# Patient Record
Sex: Female | Born: 1955 | Race: White | Hispanic: No | Marital: Married | Smoking: Never smoker
Health system: Southern US, Community
[De-identification: ages and names within clinical notes are randomized; demographics above are authoritative.]

## PROBLEM LIST (undated history)

## (undated) DIAGNOSIS — I10 Essential (primary) hypertension: Secondary | ICD-10-CM

## (undated) DIAGNOSIS — E785 Hyperlipidemia, unspecified: Secondary | ICD-10-CM

## (undated) DIAGNOSIS — G8929 Other chronic pain: Secondary | ICD-10-CM

## (undated) DIAGNOSIS — Z87442 Personal history of urinary calculi: Secondary | ICD-10-CM

## (undated) DIAGNOSIS — J309 Allergic rhinitis, unspecified: Secondary | ICD-10-CM

## (undated) DIAGNOSIS — T7840XA Allergy, unspecified, initial encounter: Secondary | ICD-10-CM

## (undated) DIAGNOSIS — M542 Cervicalgia: Secondary | ICD-10-CM

## (undated) DIAGNOSIS — M353 Polymyalgia rheumatica: Secondary | ICD-10-CM

## (undated) DIAGNOSIS — K219 Gastro-esophageal reflux disease without esophagitis: Secondary | ICD-10-CM

## (undated) DIAGNOSIS — M1990 Unspecified osteoarthritis, unspecified site: Secondary | ICD-10-CM

## (undated) DIAGNOSIS — J320 Chronic maxillary sinusitis: Secondary | ICD-10-CM

## (undated) DIAGNOSIS — M5090 Cervical disc disorder, unspecified, unspecified cervical region: Secondary | ICD-10-CM

## (undated) DIAGNOSIS — E039 Hypothyroidism, unspecified: Secondary | ICD-10-CM

## (undated) DIAGNOSIS — J019 Acute sinusitis, unspecified: Secondary | ICD-10-CM

## (undated) HISTORY — PX: PARTIAL HYSTERECTOMY: SHX80

## (undated) HISTORY — PX: TUBAL LIGATION: SHX77

## (undated) HISTORY — DX: Acute sinusitis, unspecified: J01.90

## (undated) HISTORY — DX: Allergic rhinitis, unspecified: J30.9

## (undated) HISTORY — DX: Unspecified osteoarthritis, unspecified site: M19.90

## (undated) HISTORY — DX: Other chronic pain: G89.29

## (undated) HISTORY — DX: Hyperlipidemia, unspecified: E78.5

## (undated) HISTORY — DX: Chronic maxillary sinusitis: J32.0

## (undated) HISTORY — DX: Cervical disc disorder, unspecified, unspecified cervical region: M50.90

## (undated) HISTORY — DX: Essential (primary) hypertension: I10

## (undated) HISTORY — DX: Personal history of urinary calculi: Z87.442

## (undated) HISTORY — DX: Allergy, unspecified, initial encounter: T78.40XA

## (undated) HISTORY — DX: Hypothyroidism, unspecified: E03.9

## (undated) HISTORY — PX: OTHER SURGICAL HISTORY: SHX169

## (undated) HISTORY — PX: COLONOSCOPY: SHX174

## (undated) HISTORY — PX: TONSILLECTOMY: SUR1361

## (undated) HISTORY — DX: Gastro-esophageal reflux disease without esophagitis: K21.9

## (undated) HISTORY — DX: Cervicalgia: M54.2

---
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
## 1994-04-06 HISTORY — PX: ABDOMINAL HYSTERECTOMY: SHX81

## 2000-11-29 ENCOUNTER — Other Ambulatory Visit: Admission: RE | Admit: 2000-11-29 | Discharge: 2000-11-29 | Payer: Self-pay | Admitting: Family Medicine

## 2001-02-01 ENCOUNTER — Encounter: Payer: Self-pay | Admitting: Family Medicine

## 2001-02-01 ENCOUNTER — Encounter: Admission: RE | Admit: 2001-02-01 | Discharge: 2001-02-01 | Payer: Self-pay | Admitting: Family Medicine

## 2002-01-19 ENCOUNTER — Other Ambulatory Visit: Admission: RE | Admit: 2002-01-19 | Discharge: 2002-01-19 | Payer: Self-pay | Admitting: Obstetrics & Gynecology

## 2002-06-03 ENCOUNTER — Emergency Department (HOSPITAL_COMMUNITY): Admission: EM | Admit: 2002-06-03 | Discharge: 2002-06-03 | Payer: Self-pay

## 2003-04-07 ENCOUNTER — Encounter: Payer: Self-pay | Admitting: Internal Medicine

## 2003-04-07 LAB — CONVERTED CEMR LAB

## 2003-07-04 ENCOUNTER — Other Ambulatory Visit: Admission: RE | Admit: 2003-07-04 | Discharge: 2003-07-04 | Payer: Self-pay | Admitting: Obstetrics & Gynecology

## 2004-03-27 ENCOUNTER — Ambulatory Visit: Payer: Self-pay | Admitting: Internal Medicine

## 2004-10-16 ENCOUNTER — Ambulatory Visit: Payer: Self-pay | Admitting: Internal Medicine

## 2004-12-16 ENCOUNTER — Ambulatory Visit: Payer: Self-pay | Admitting: Internal Medicine

## 2004-12-23 ENCOUNTER — Ambulatory Visit: Payer: Self-pay | Admitting: Internal Medicine

## 2005-01-27 ENCOUNTER — Ambulatory Visit: Payer: Self-pay | Admitting: Internal Medicine

## 2005-03-03 ENCOUNTER — Ambulatory Visit: Payer: Self-pay | Admitting: Internal Medicine

## 2005-03-11 ENCOUNTER — Ambulatory Visit: Payer: Self-pay | Admitting: Gastroenterology

## 2005-03-11 ENCOUNTER — Ambulatory Visit: Payer: Self-pay | Admitting: *Deleted

## 2005-03-16 ENCOUNTER — Ambulatory Visit: Payer: Self-pay | Admitting: Internal Medicine

## 2005-04-07 ENCOUNTER — Other Ambulatory Visit: Admission: RE | Admit: 2005-04-07 | Discharge: 2005-04-07 | Payer: Self-pay | Admitting: Obstetrics & Gynecology

## 2005-04-20 ENCOUNTER — Ambulatory Visit: Payer: Self-pay | Admitting: Gastroenterology

## 2005-05-27 ENCOUNTER — Encounter: Payer: Self-pay | Admitting: Internal Medicine

## 2005-05-27 ENCOUNTER — Ambulatory Visit: Payer: Self-pay | Admitting: Gastroenterology

## 2005-08-11 ENCOUNTER — Ambulatory Visit: Payer: Self-pay | Admitting: Internal Medicine

## 2005-10-08 ENCOUNTER — Ambulatory Visit: Payer: Self-pay | Admitting: Internal Medicine

## 2005-12-04 ENCOUNTER — Ambulatory Visit: Payer: Self-pay | Admitting: Internal Medicine

## 2005-12-14 ENCOUNTER — Ambulatory Visit: Payer: Self-pay | Admitting: Internal Medicine

## 2005-12-22 ENCOUNTER — Ambulatory Visit: Payer: Self-pay | Admitting: Internal Medicine

## 2005-12-29 ENCOUNTER — Ambulatory Visit: Payer: Self-pay | Admitting: Internal Medicine

## 2006-02-16 ENCOUNTER — Ambulatory Visit: Payer: Self-pay | Admitting: Internal Medicine

## 2006-06-04 ENCOUNTER — Ambulatory Visit: Payer: Self-pay | Admitting: Internal Medicine

## 2006-09-21 ENCOUNTER — Ambulatory Visit: Payer: Self-pay | Admitting: Internal Medicine

## 2006-11-02 ENCOUNTER — Ambulatory Visit: Payer: Self-pay | Admitting: Internal Medicine

## 2006-11-02 DIAGNOSIS — K219 Gastro-esophageal reflux disease without esophagitis: Secondary | ICD-10-CM

## 2006-11-02 DIAGNOSIS — J309 Allergic rhinitis, unspecified: Secondary | ICD-10-CM

## 2006-11-02 DIAGNOSIS — E039 Hypothyroidism, unspecified: Secondary | ICD-10-CM | POA: Insufficient documentation

## 2006-11-02 HISTORY — DX: Hypothyroidism, unspecified: E03.9

## 2006-11-02 HISTORY — DX: Allergic rhinitis, unspecified: J30.9

## 2006-11-02 HISTORY — DX: Gastro-esophageal reflux disease without esophagitis: K21.9

## 2006-11-13 ENCOUNTER — Encounter: Payer: Self-pay | Admitting: Internal Medicine

## 2006-12-30 ENCOUNTER — Ambulatory Visit: Payer: Self-pay | Admitting: Internal Medicine

## 2006-12-30 LAB — CONVERTED CEMR LAB
ALT: 18 units/L (ref 0–35)
AST: 19 units/L (ref 0–37)
Albumin: 3.8 g/dL (ref 3.5–5.2)
Alkaline Phosphatase: 67 units/L (ref 39–117)
BUN: 9 mg/dL (ref 6–23)
Basophils Absolute: 0.1 10*3/uL (ref 0.0–0.1)
Basophils Relative: 1 % (ref 0.0–1.0)
Bilirubin Urine: NEGATIVE
Bilirubin, Direct: 0.1 mg/dL (ref 0.0–0.3)
CO2: 29 meq/L (ref 19–32)
Calcium: 9.1 mg/dL (ref 8.4–10.5)
Chloride: 106 meq/L (ref 96–112)
Cholesterol: 185 mg/dL (ref 0–200)
Creatinine, Ser: 0.7 mg/dL (ref 0.4–1.2)
Eosinophils Absolute: 0.2 10*3/uL (ref 0.0–0.6)
Eosinophils Relative: 3.1 % (ref 0.0–5.0)
GFR calc Af Amer: 113 mL/min
GFR calc non Af Amer: 94 mL/min
Glucose, Bld: 86 mg/dL (ref 70–99)
HCT: 38.1 % (ref 36.0–46.0)
HDL: 57.3 mg/dL (ref >39.0)
Hemoglobin, Urine: NEGATIVE
Hemoglobin: 13.2 g/dL (ref 12.0–15.0)
Ketones, ur: NEGATIVE mg/dL
LDL Cholesterol: 106 mg/dL — ABNORMAL HIGH (ref 0–99)
Leukocytes, UA: NEGATIVE
Lymphocytes Relative: 35.5 % (ref 12.0–46.0)
MCHC: 34.6 g/dL (ref 30.0–36.0)
MCV: 88.5 fL (ref 78.0–100.0)
Monocytes Absolute: 0.4 10*3/uL (ref 0.2–0.7)
Monocytes Relative: 7.1 % (ref 3.0–11.0)
Neutro Abs: 2.8 10*3/uL (ref 1.4–7.7)
Neutrophils Relative %: 53.3 % (ref 43.0–77.0)
Nitrite: NEGATIVE
Platelets: 186 10*3/uL (ref 150–400)
Potassium: 4.3 meq/L (ref 3.5–5.1)
RBC: 4.31 M/uL (ref 3.87–5.11)
RDW: 12.7 % (ref 11.5–14.6)
Sodium: 140 meq/L (ref 135–145)
Specific Gravity, Urine: 1.015 (ref 1.000–1.03)
TSH: 13.56 microintl units/mL — ABNORMAL HIGH (ref 0.35–5.50)
Total Bilirubin: 0.9 mg/dL (ref 0.3–1.2)
Total CHOL/HDL Ratio: 3.2
Total Protein, Urine: NEGATIVE mg/dL
Total Protein: 6.2 g/dL (ref 6.0–8.3)
Triglycerides: 111 mg/dL (ref 0–149)
Urine Glucose: NEGATIVE mg/dL
Urobilinogen, UA: 0.2 (ref 0.0–1.0)
VLDL: 22 mg/dL (ref 0–40)
WBC: 5.4 10*3/uL (ref 4.5–10.5)
pH: 7 (ref 5.0–8.0)

## 2007-01-05 ENCOUNTER — Ambulatory Visit: Payer: Self-pay | Admitting: Internal Medicine

## 2007-01-10 ENCOUNTER — Encounter: Payer: Self-pay | Admitting: Internal Medicine

## 2007-01-10 ENCOUNTER — Ambulatory Visit: Payer: Self-pay | Admitting: Internal Medicine

## 2007-01-10 DIAGNOSIS — E785 Hyperlipidemia, unspecified: Secondary | ICD-10-CM

## 2007-01-10 DIAGNOSIS — M5412 Radiculopathy, cervical region: Secondary | ICD-10-CM | POA: Insufficient documentation

## 2007-01-10 DIAGNOSIS — Z87442 Personal history of urinary calculi: Secondary | ICD-10-CM

## 2007-01-10 DIAGNOSIS — I10 Essential (primary) hypertension: Secondary | ICD-10-CM | POA: Insufficient documentation

## 2007-01-10 HISTORY — DX: Personal history of urinary calculi: Z87.442

## 2007-01-10 HISTORY — DX: Hyperlipidemia, unspecified: E78.5

## 2007-01-10 HISTORY — DX: Essential (primary) hypertension: I10

## 2007-02-01 ENCOUNTER — Telehealth (INDEPENDENT_AMBULATORY_CARE_PROVIDER_SITE_OTHER): Payer: Self-pay | Admitting: *Deleted

## 2007-02-09 ENCOUNTER — Ambulatory Visit: Payer: Self-pay | Admitting: Internal Medicine

## 2007-02-09 DIAGNOSIS — R519 Headache, unspecified: Secondary | ICD-10-CM | POA: Insufficient documentation

## 2007-02-09 DIAGNOSIS — R51 Headache: Secondary | ICD-10-CM | POA: Insufficient documentation

## 2007-04-18 ENCOUNTER — Ambulatory Visit: Payer: Self-pay | Admitting: Internal Medicine

## 2007-06-09 ENCOUNTER — Ambulatory Visit: Payer: Self-pay | Admitting: Internal Medicine

## 2007-06-22 ENCOUNTER — Ambulatory Visit: Payer: Self-pay | Admitting: Internal Medicine

## 2007-08-12 ENCOUNTER — Ambulatory Visit: Payer: Self-pay | Admitting: Internal Medicine

## 2007-08-15 ENCOUNTER — Encounter: Payer: Self-pay | Admitting: Internal Medicine

## 2007-09-13 ENCOUNTER — Telehealth (INDEPENDENT_AMBULATORY_CARE_PROVIDER_SITE_OTHER): Payer: Self-pay | Admitting: *Deleted

## 2007-09-13 ENCOUNTER — Encounter: Payer: Self-pay | Admitting: Internal Medicine

## 2007-10-04 ENCOUNTER — Telehealth (INDEPENDENT_AMBULATORY_CARE_PROVIDER_SITE_OTHER): Payer: Self-pay | Admitting: *Deleted

## 2008-01-05 ENCOUNTER — Ambulatory Visit: Payer: Self-pay | Admitting: Internal Medicine

## 2008-01-17 ENCOUNTER — Telehealth (INDEPENDENT_AMBULATORY_CARE_PROVIDER_SITE_OTHER): Payer: Self-pay | Admitting: *Deleted

## 2008-01-31 ENCOUNTER — Ambulatory Visit: Payer: Self-pay | Admitting: Internal Medicine

## 2008-01-31 LAB — CONVERTED CEMR LAB
ALT: 17 units/L (ref 0–35)
AST: 19 units/L (ref 0–37)
Albumin: 3.7 g/dL (ref 3.5–5.2)
Alkaline Phosphatase: 81 units/L (ref 39–117)
BUN: 9 mg/dL (ref 6–23)
Basophils Absolute: 0 10*3/uL (ref 0.0–0.1)
Basophils Relative: 0.7 % (ref 0.0–3.0)
Bilirubin Urine: NEGATIVE
Bilirubin, Direct: 0.1 mg/dL (ref 0.0–0.3)
CO2: 28 meq/L (ref 19–32)
Calcium: 9.1 mg/dL (ref 8.4–10.5)
Chloride: 108 meq/L (ref 96–112)
Cholesterol: 179 mg/dL (ref 0–200)
Creatinine, Ser: 0.7 mg/dL (ref 0.4–1.2)
Eosinophils Absolute: 0.2 10*3/uL (ref 0.0–0.7)
Eosinophils Relative: 3.5 % (ref 0.0–5.0)
GFR calc Af Amer: 113 mL/min
GFR calc non Af Amer: 93 mL/min
Glucose, Bld: 95 mg/dL (ref 70–99)
HCT: 36.4 % (ref 36.0–46.0)
HDL: 46.5 mg/dL (ref >39.0)
Hemoglobin, Urine: NEGATIVE
Hemoglobin: 12.8 g/dL (ref 12.0–15.0)
Ketones, ur: NEGATIVE mg/dL
LDL Cholesterol: 107 mg/dL — ABNORMAL HIGH (ref 0–99)
Leukocytes, UA: NEGATIVE
Lymphocytes Relative: 39.3 % (ref 12.0–46.0)
MCHC: 35.1 g/dL (ref 30.0–36.0)
MCV: 89.4 fL (ref 78.0–100.0)
Monocytes Absolute: 0.4 10*3/uL (ref 0.1–1.0)
Monocytes Relative: 7.3 % (ref 3.0–12.0)
Neutro Abs: 3 10*3/uL (ref 1.4–7.7)
Neutrophils Relative %: 49.2 % (ref 43.0–77.0)
Nitrite: NEGATIVE
Platelets: 155 10*3/uL (ref 150–400)
Potassium: 4.2 meq/L (ref 3.5–5.1)
RBC: 4.07 M/uL (ref 3.87–5.11)
RDW: 12.5 % (ref 11.5–14.6)
Sodium: 141 meq/L (ref 135–145)
Specific Gravity, Urine: 1.025 (ref 1.000–1.03)
TSH: 2.85 microintl units/mL (ref 0.35–5.50)
Total Bilirubin: 0.4 mg/dL (ref 0.3–1.2)
Total CHOL/HDL Ratio: 3.8
Total Protein, Urine: NEGATIVE mg/dL
Total Protein: 6.2 g/dL (ref 6.0–8.3)
Triglycerides: 128 mg/dL (ref 0–149)
Urine Glucose: NEGATIVE mg/dL
Urobilinogen, UA: 0.2 (ref 0.0–1.0)
VLDL: 26 mg/dL (ref 0–40)
WBC: 5.9 10*3/uL (ref 4.5–10.5)
pH: 5.5 (ref 5.0–8.0)

## 2008-02-06 ENCOUNTER — Ambulatory Visit: Payer: Self-pay | Admitting: Internal Medicine

## 2008-03-06 ENCOUNTER — Ambulatory Visit: Payer: Self-pay | Admitting: Internal Medicine

## 2008-03-06 DIAGNOSIS — M6740 Ganglion, unspecified site: Secondary | ICD-10-CM | POA: Insufficient documentation

## 2008-03-22 ENCOUNTER — Telehealth (INDEPENDENT_AMBULATORY_CARE_PROVIDER_SITE_OTHER): Payer: Self-pay | Admitting: *Deleted

## 2008-05-18 ENCOUNTER — Ambulatory Visit: Payer: Self-pay | Admitting: Internal Medicine

## 2008-05-18 DIAGNOSIS — B370 Candidal stomatitis: Secondary | ICD-10-CM | POA: Insufficient documentation

## 2008-05-18 DIAGNOSIS — M722 Plantar fascial fibromatosis: Secondary | ICD-10-CM | POA: Insufficient documentation

## 2008-08-22 ENCOUNTER — Telehealth: Payer: Self-pay | Admitting: Internal Medicine

## 2008-09-04 LAB — CONVERTED CEMR LAB: Pap Smear: NORMAL

## 2008-09-05 ENCOUNTER — Ambulatory Visit: Payer: Self-pay | Admitting: Internal Medicine

## 2008-10-05 ENCOUNTER — Ambulatory Visit: Payer: Self-pay | Admitting: Internal Medicine

## 2008-10-05 DIAGNOSIS — J320 Chronic maxillary sinusitis: Secondary | ICD-10-CM | POA: Insufficient documentation

## 2008-10-05 HISTORY — DX: Chronic maxillary sinusitis: J32.0

## 2009-02-01 ENCOUNTER — Ambulatory Visit: Payer: Self-pay | Admitting: Internal Medicine

## 2009-02-04 LAB — CONVERTED CEMR LAB
ALT: 19 units/L (ref 0–35)
AST: 21 units/L (ref 0–37)
Albumin: 4.4 g/dL (ref 3.5–5.2)
Alkaline Phosphatase: 73 units/L (ref 39–117)
BUN: 11 mg/dL (ref 6–23)
Basophils Absolute: 0 10*3/uL (ref 0.0–0.1)
Basophils Relative: 0.7 % (ref 0.0–3.0)
Bilirubin Urine: NEGATIVE
Bilirubin, Direct: 0 mg/dL (ref 0.0–0.3)
CO2: 30 meq/L (ref 19–32)
Calcium: 9.4 mg/dL (ref 8.4–10.5)
Chloride: 99 meq/L (ref 96–112)
Cholesterol: 234 mg/dL — ABNORMAL HIGH (ref 0–200)
Creatinine, Ser: 0.7 mg/dL (ref 0.4–1.2)
Direct LDL: 151.7 mg/dL
Eosinophils Absolute: 0.2 10*3/uL (ref 0.0–0.7)
Eosinophils Relative: 2.5 % (ref 0.0–5.0)
GFR calc non Af Amer: 92.85 mL/min (ref >60)
Glucose, Bld: 90 mg/dL (ref 70–99)
HCT: 37.8 % (ref 36.0–46.0)
HDL: 53.8 mg/dL (ref >39.00)
Hemoglobin, Urine: NEGATIVE
Hemoglobin: 13.5 g/dL (ref 12.0–15.0)
Ketones, ur: NEGATIVE mg/dL
Leukocytes, UA: NEGATIVE
Lymphocytes Relative: 32.1 % (ref 12.0–46.0)
Lymphs Abs: 2.2 10*3/uL (ref 0.7–4.0)
MCHC: 35.7 g/dL (ref 30.0–36.0)
MCV: 91.8 fL (ref 78.0–100.0)
Monocytes Absolute: 0.4 10*3/uL (ref 0.1–1.0)
Monocytes Relative: 5.6 % (ref 3.0–12.0)
Neutro Abs: 3.9 10*3/uL (ref 1.4–7.7)
Neutrophils Relative %: 59.1 % (ref 43.0–77.0)
Nitrite: NEGATIVE
Platelets: 186 10*3/uL (ref 150.0–400.0)
Potassium: 4 meq/L (ref 3.5–5.1)
RBC: 4.12 M/uL (ref 3.87–5.11)
RDW: 13.1 % (ref 11.5–14.6)
Sodium: 138 meq/L (ref 135–145)
Specific Gravity, Urine: 1.015 (ref 1.000–1.030)
TSH: 97.75 microintl units/mL — ABNORMAL HIGH (ref 0.35–5.50)
Total Bilirubin: 0.5 mg/dL (ref 0.3–1.2)
Total CHOL/HDL Ratio: 4
Total Protein, Urine: NEGATIVE mg/dL
Total Protein: 7.4 g/dL (ref 6.0–8.3)
Triglycerides: 211 mg/dL — ABNORMAL HIGH (ref 0.0–149.0)
Urine Glucose: NEGATIVE mg/dL
Urobilinogen, UA: 0.2 (ref 0.0–1.0)
VLDL: 42.2 mg/dL — ABNORMAL HIGH (ref 0.0–40.0)
WBC: 6.7 10*3/uL (ref 4.5–10.5)
pH: 6.5 (ref 5.0–8.0)

## 2009-02-06 ENCOUNTER — Ambulatory Visit: Payer: Self-pay | Admitting: Internal Medicine

## 2009-02-06 DIAGNOSIS — G4710 Hypersomnia, unspecified: Secondary | ICD-10-CM | POA: Insufficient documentation

## 2009-02-21 ENCOUNTER — Telehealth: Payer: Self-pay | Admitting: Internal Medicine

## 2009-03-07 ENCOUNTER — Ambulatory Visit: Payer: Self-pay | Admitting: Internal Medicine

## 2009-03-07 LAB — CONVERTED CEMR LAB: TSH: 4.31 microintl units/mL (ref 0.35–5.50)

## 2009-03-14 ENCOUNTER — Ambulatory Visit: Payer: Self-pay | Admitting: Internal Medicine

## 2009-08-23 ENCOUNTER — Ambulatory Visit: Payer: Self-pay | Admitting: Internal Medicine

## 2009-09-04 LAB — CONVERTED CEMR LAB: Pap Smear: NORMAL

## 2010-01-31 ENCOUNTER — Telehealth: Payer: Self-pay | Admitting: Internal Medicine

## 2010-03-27 ENCOUNTER — Ambulatory Visit: Payer: Self-pay | Admitting: Internal Medicine

## 2010-03-27 LAB — CONVERTED CEMR LAB
ALT: 17 units/L (ref 0–35)
AST: 16 units/L (ref 0–37)
Albumin: 4.1 g/dL (ref 3.5–5.2)
Alkaline Phosphatase: 70 units/L (ref 39–117)
BUN: 12 mg/dL (ref 6–23)
Basophils Absolute: 0 10*3/uL (ref 0.0–0.1)
Basophils Relative: 0.8 % (ref 0.0–3.0)
Bilirubin Urine: NEGATIVE
Bilirubin, Direct: 0.1 mg/dL (ref 0.0–0.3)
Blood, UA: NEGATIVE
CO2: 26 meq/L (ref 19–32)
Calcium: 8.8 mg/dL (ref 8.4–10.5)
Chloride: 105 meq/L (ref 96–112)
Cholesterol: 174 mg/dL (ref 0–200)
Creatinine, Ser: 0.7 mg/dL (ref 0.4–1.2)
Eosinophils Absolute: 0.1 10*3/uL (ref 0.0–0.7)
Eosinophils Relative: 1.5 % (ref 0.0–5.0)
Folate: 7.6 ng/mL
GFR calc non Af Amer: 94 mL/min (ref >60.00)
Glucose, Bld: 79 mg/dL (ref 70–99)
HCT: 34.9 % — ABNORMAL LOW (ref 36.0–46.0)
HDL: 44.8 mg/dL (ref >39.00)
Hemoglobin: 11.9 g/dL — ABNORMAL LOW (ref 12.0–15.0)
Iron: 60 ug/dL (ref 42–145)
Ketones, ur: NEGATIVE mg/dL
LDL Cholesterol: 97 mg/dL (ref 0–99)
Leukocytes, UA: NEGATIVE
Lymphocytes Relative: 35.9 % (ref 12.0–46.0)
Lymphs Abs: 2 10*3/uL (ref 0.7–4.0)
MCHC: 34.1 g/dL (ref 30.0–36.0)
MCV: 90.3 fL (ref 78.0–100.0)
Monocytes Absolute: 0.3 10*3/uL (ref 0.1–1.0)
Monocytes Relative: 6.2 % (ref 3.0–12.0)
Neutro Abs: 3.1 10*3/uL (ref 1.4–7.7)
Neutrophils Relative %: 55.6 % (ref 43.0–77.0)
Nitrite: NEGATIVE
Platelets: 208 10*3/uL (ref 150.0–400.0)
Potassium: 4 meq/L (ref 3.5–5.1)
RBC: 3.87 M/uL (ref 3.87–5.11)
RDW: 13.9 % (ref 11.5–14.6)
Saturation Ratios: 14.4 % — ABNORMAL LOW (ref 20.0–50.0)
Sodium: 138 meq/L (ref 135–145)
Specific Gravity, Urine: 1.02 (ref 1.000–1.030)
TSH: 2.98 microintl units/mL (ref 0.35–5.50)
Total Bilirubin: 0.5 mg/dL (ref 0.3–1.2)
Total CHOL/HDL Ratio: 4
Total Protein, Urine: NEGATIVE mg/dL
Total Protein: 6.5 g/dL (ref 6.0–8.3)
Transferrin: 298.4 mg/dL (ref 212.0–360.0)
Triglycerides: 162 mg/dL — ABNORMAL HIGH (ref 0.0–149.0)
Urine Glucose: NEGATIVE mg/dL
Urobilinogen, UA: 0.2 (ref 0.0–1.0)
VLDL: 32.4 mg/dL (ref 0.0–40.0)
Vitamin B-12: 310 pg/mL (ref 211–911)
WBC: 5.6 10*3/uL (ref 4.5–10.5)
pH: 6 (ref 5.0–8.0)

## 2010-04-03 ENCOUNTER — Encounter: Payer: Self-pay | Admitting: Internal Medicine

## 2010-04-03 ENCOUNTER — Ambulatory Visit: Payer: Self-pay | Admitting: Internal Medicine

## 2010-04-03 DIAGNOSIS — J019 Acute sinusitis, unspecified: Secondary | ICD-10-CM | POA: Insufficient documentation

## 2010-04-03 HISTORY — DX: Acute sinusitis, unspecified: J01.90

## 2010-05-06 NOTE — Assessment & Plan Note (Signed)
Summary: DR Sammuel Cooper PT/NO CLINIC-SINUS PROBLEM  STC   Vital Signs:  Patient profile:   55 year old female Height:      67 inches (170.18 cm) Weight:      214.0 pounds (97.27 kg) O2 Sat:      97 % on Room air Temp:     100.1 degrees F (37.83 degrees C) oral Pulse rate:   100 / minute BP sitting:   114 / 82  (left arm) Cuff size:   large  Vitals Entered By: Orlan Leavens (Aug 23, 2009 1:18 PM)  O2 Flow:  Room air CC: sinus problem, URI symptoms Is Patient Diabetic? No Pain Assessment Patient in pain? no        Primary Care Provider:  Corwin Levins MD  CC:  sinus problem and URI symptoms.  History of Present Illness:  URI Symptoms      This is a 55 year old woman who presents with URI symptoms.  The symptoms began 3 weeks ago.  The severity is described as moderate.  long hx recurrent symptoms -.  The patient reports nasal congestion, purulent nasal discharge, sore throat, and earache, but denies clear nasal discharge and sick contacts.  Associated symptoms include fever of 100.5-103 degrees.  The patient denies stiff neck, dyspnea, wheezing, rash, and diarrhea.  The patient also reports itchy throat, seasonal symptoms, headache, and severe fatigue.  The patient denies sneezing, response to antihistamine, and muscle aches.  The patient denies the following risk factors for Strep sinusitis: unilateral facial pain, unilateral nasal discharge, poor response to decongestant, tooth pain, Strep exposure, and tender adenopathy.     Current Medications (verified): 1)  Levothyroxine Sodium 112 Mcg  Tabs (Levothyroxine Sodium) .Marland Kitchen.. 1 By Mouth Once Daily 2)  Lisinopril 20 Mg Tabs (Lisinopril) .Marland Kitchen.. 1 By Mouth Once Daily 3)  Vivelle-Dot 0.0375 Mg/24hr  Pttw (Estradiol) .... Once Daily To Skin 4)  Hydrocodone-Acetaminophen 5-500 Mg Tabs (Hydrocodone-Acetaminophen) .... Take 1 Tablet By Mouth Two Times A Day As Needed 5)  Allegra-D 12 Hour 60-120 Mg  Tb12 (Fexofenadine-Pseudoephedrine) .Marland Kitchen.. 1 By Mouth  Two Times A Day Prn 6)  Fluticasone Propionate 50 Mcg/act  Susp (Fluticasone Propionate) .... 2 Spray/side Once Daily 7)  Hyoscyamine Sulfate 0.125 Mg Tbdp (Hyoscyamine Sulfate) .... Dissolve 2 Tablets Under The Tounge Every 4 Hours If Needed  Allergies (verified): No Known Drug Allergies  Past History:  Past Medical History: Hypothyroidism Allergic rhinitis GERD, mild Hypertension Hyperlipidemia chronic neck pain/c-spine disc dz Nephrolithiasis, hx of  Review of Systems  The patient denies vision loss, hoarseness, dyspnea on exertion, hemoptysis, and abdominal pain.    Physical Exam  General:  alert and overweight-appearing.  , mild ill  Eyes:  vision grossly intact, pupils equal, and pupils round.   Ears:  left tm with mild erythema, right tm ok, canals clear, sinus tender mild bilat max areas Mouth:  pharyngeal erythema and fair dentition.   Lungs:  normal respiratory effort, no intercostal retractions or use of accessory muscles; normal breath sounds bilaterally - no crackles and no wheezes.    Heart:  normal rate, regular rhythm, no murmur, and no rub. 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 1po once daily Her updated medication list for this problem includes:    Allegra-d 12 Hour 60-120 Mg Tb12 (Fexofenadine-pseudoephedrine) .Marland Kitchen... 1 by mouth two times a day prn    Fluticasone Propionate 50 Mcg/act Susp (Fluticasone propionate) .Marland Kitchen... 2 spray/side once daily    Levaquin 500 Mg Tabs (Levofloxacin) .Marland Kitchen... 1 by mouth once daily x 2 weeks  Orders: Prescription Created Electronically [phone])  Complete Medication List: 1)  Levothyroxine Sodium 112 Mcg Tabs  (Levothyroxine sodium) .Marland Kitchen.. 1 by mouth once daily 2)  Lisinopril 20 Mg Tabs (Lisinopril) .Marland Kitchen.. 1 by mouth once daily 3)  Vivelle-dot 0.0375 Mg/24hr Pttw (Estradiol) .... Once daily to skin 4)  Hydrocodone-acetaminophen 5-500 Mg Tabs (Hydrocodone-acetaminophen) .... Take 1 tablet by mouth two times a day as needed 5)  Allegra-d 12 Hour 60-120 Mg Tb12 (Fexofenadine-pseudoephedrine) .Marland Kitchen.. 1 by mouth two times a day prn 6)  Fluticasone Propionate 50 Mcg/act Susp (Fluticasone propionate) .... 2 spray/side once daily 7)  Hyoscyamine Sulfate 0.125 Mg Tbdp (Hyoscyamine sulfate) .... Dissolve 2 tablets under the tounge every 4 hours if needed 8)  Levaquin 500 Mg Tabs (Levofloxacin) .Marland Kitchen.. 1 by mouth once daily x 2 weeks  Patient Instructions: 1)  it was good to see you today. 2)  antibitoics - Levaquin and continue nasal steroid spray - 3)  your prescriptions have been electronically submitted to your pharmacy. Please take as directed. Contact our office if you believe you're having problems with the medication(s).  4)  Get plenty of rest, drink lots of clear liquids, and use Tylenol or Ibuprofen for fever and comfort. Return in 7-10 days if you're not better:sooner if you're feeling worse. Prescriptions: FLUTICASONE PROPIONATE 50 MCG/ACT  SUSP (FLUTICASONE PROPIONATE) 2 spray/side once daily  #3 x 3   Entered and Authorized by:   Newt Lukes MD   Signed by:   Newt Lukes MD on 08/23/2009   Method used:   Electronically to        [facility]. [phone]* (retail)       [address]       [place], [place]  [address]       Ph: [phone]       Fax: [phone]   RxID:   [id number] LEVAQUIN 500 MG TABS (LEVOFLOXACIN) 1 by mouth once daily x 2 weeks  #14 x 0   Entered and Authorized by:   Newt Lukes MD   Signed by:   Newt Lukes MD on 08/23/2009   Method used:   Electronically to        [facility]. [phone]* (retail)       [address]       [place], [place]  [address]       Ph: [phone]       Fax: [phone]   RxID:   [id number]

## 2010-05-06 NOTE — Progress Notes (Signed)
Summary: lab review  Phone Note Call from Patient Call back at Home Phone [phone]   Caller: Patient Call For: dr Jonny Ruiz Summary of Call: per pt call  would like dr Jonny Ruiz to review  her labs that were done at  Dr neal office . want to know if there is anything he would like her to do, and  to see if he wanted to changed her thyroid medicine before she go  for a refill. have about 6 days left on her refill .......lab on cart Initial call taken by: Shelbie Proctor,  October 04, 2007 8:29 AM  Follow-up for Phone Call        already addressed - see lab sheet I reviewed yesterday regrarding the thyroid med Follow-up by: Corwin Levins MD,  October 04, 2007 8:57 AM         Appended Document: lab review LMOVM stating that MD reviewed lad work and advised that pt take Levothyroxine . Rx has been sent to pharmacy

## 2010-05-06 NOTE — Progress Notes (Signed)
Summary: not better  Phone Note Call from Patient Call back at Home Phone [phone]   Caller: Patient Call For: Corwin Levins MD Summary of Call: per Garnetta Buddy call saw  Dr  Jonny Ruiz on 12-01 still have a persistent- pain in her throat and ear pain want to know if he can send  in another medication for this has finished up the antibotics that was prescribe [facility]  [facility]. [phone]* (retail) [address] [place], [place]  [address] Ph: [phone] Fax: [phone] Initial call taken by: Shelbie Proctor,  March 22, 2008 2:50 PM  Follow-up for Phone Call        can try zpack x 1 - done escript Follow-up by: Corwin Levins MD,  March 22, 2008 3:26 PM  Additional Follow-up for Phone Call Additional follow up Details #1::        called pt to inform that rx was sent Additional Follow-up by: Shelbie Proctor,  March 22, 2008 3:50 PM    New/Updated Medications: AZITHROMYCIN 250 MG TABS (AZITHROMYCIN) 2po qd for 1 day, then 1po qd for 4days, then stop   Prescriptions: AZITHROMYCIN 250 MG TABS (AZITHROMYCIN) 2po qd for 1 day, then 1po qd for 4days, then stop  #6 x 1   Entered and Authorized by:   Corwin Levins MD   Signed by:   Corwin Levins MD on 03/22/2008   Method used:   Electronically to        [facility]. [phone]* (retail)       [address]       [place], [place]  [address]       Ph: [phone]       Fax: [phone]   RxID:   [id number]

## 2010-05-06 NOTE — Assessment & Plan Note (Signed)
Summary: PER PT PHYSICAL---$50--STC   Vital Signs:  Patient Profile:   55 Years Old Female Weight:      206.13 pounds Temp:     98.1 degrees F oral Pulse rate:   92 / minute BP sitting:   138 / 80  (left arm) Cuff size:   regular  Vitals Entered By: Windell Norfolk (February 06, 2008 1:14 PM)                 Preventive Care Screening  Last Tetanus Booster:    Date:  02/06/2008    Results:  declined  Last Flu Shot:    Date:  01/23/2008    Results:  given    Chief Complaint:  CPX.  History of Present Illness: still has some right ear congesiton and sinus crackling on the left; has also tried the nettie pot but not much help; has seens allergy and ENT; just seems to "pull her down,"      Updated Prior Medication List: LEVOTHYROXINE SODIUM 112 MCG  TABS (LEVOTHYROXINE SODIUM) 1 by mouth once daily LISINOPRIL 20 MG TABS (LISINOPRIL) 1 by mouth once daily VIVELLE-DOT 0.0375 MG/24HR  PTTW (ESTRADIOL) once daily to skin HYDROCODONE-ACETAMINOPHEN 5-500 MG TABS (HYDROCODONE-ACETAMINOPHEN) Take 1 tablet by mouth two times a day as needed ALLEGRA-D 12 HOUR 60-120 MG  TB12 (FEXOFENADINE-PSEUDOEPHEDRINE) 1 by mouth two times a day prn FLUTICASONE PROPIONATE 50 MCG/ACT  SUSP (FLUTICASONE PROPIONATE) 2 spray/side once daily  Current Allergies (reviewed today): No known allergies   Past Medical History:    Reviewed history from 01/10/2007 and no changes required:       Hypothyroidism       Allergic rhinitis       GERD, mild       Hypertension       Hyperlipidemia       chronic neck pain/c-spine disc dz       Nephrolithiasis, hx of  Past Surgical History:    Reviewed history from 11/02/2006 and no changes required:       Hysterectomy- 1996       Tonsillectomy- 1961   Family History:    Reviewed history from 01/10/2007 and no changes required:       Family History Diabetes 1st degree relative       Family History Hypertension  Social History:    Reviewed history from  01/10/2007 and no changes required:       Married       Never Smoked       Alcohol use-no       2 jobs - Systems analyst area; and [employer]       2 children     Review of Systems  The patient denies anorexia, fever, weight loss, weight gain, vision loss, decreased hearing, hoarseness, chest pain, syncope, dyspnea on exertion, peripheral edema, prolonged cough, headaches, hemoptysis, abdominal pain, melena, hematochezia, severe indigestion/heartburn, hematuria, incontinence, muscle weakness, suspicious skin lesions, transient blindness, difficulty walking, depression, unusual weight change, abnormal bleeding, enlarged lymph nodes, angioedema, and breast masses.         all otherwise negative    Physical Exam  General:     alert and overweight-appearing.   Head:     Normocephalic and atraumatic without obvious abnormalities. No apparent alopecia or balding. Eyes:     No corneal or conjunctival inflammation noted. EOMI. Perrla. Ears:     External ear exam shows no significant lesions or deformities.  Otoscopic examination reveals clear canals,  tympanic membranes are intact bilaterally without bulging, retraction, inflammation or discharge. Hearing is grossly normal bilaterally. Nose:     External nasal examination shows no deformity or inflammation. Nasal mucosa are pink and moist without lesions or exudates. Mouth:     Oral mucosa and oropharynx without lesions or exudates.  Teeth in good repair. Neck:     No deformities, masses, or tenderness noted. Lungs:     Normal respiratory effort, chest expands symmetrically. Lungs are clear to auscultation, no crackles or wheezes. Heart:     Normal rate and regular rhythm. S1 and S2 normal without gallop, murmur, click, rub or other extra sounds. Abdomen:     Bowel sounds positive,abdomen soft and non-tender without masses, organomegaly or hernias noted. Msk:     no joint tenderness and no joint swelling.    Extremities:     no edema, no ulcers  Neurologic:     cranial nerves II-XII intact and strength normal in all extremities.      Impression & Recommendations:  Problem # 1:  Preventive Health Care (ICD-V70.0) Overall doing well, up to date, counseled on routine health concerns for screening and prevention, immunizations up to date or declined, labs , reviewed, ecg reviewed , to follow low chol diet   Orders: EKG w/ Interpretation (93000)   Problem # 2:  HYPERTENSION, BENIGN ESSENTIAL (ICD-401.1)  Her updated medication list for this problem includes:    Lisinopril 20 Mg Tabs (Lisinopril) .Marland Kitchen... 1 by mouth once daily  BP today: 138/80 Prior BP: 116/80 (01/05/2008)  Labs Reviewed: Creat: 0.7 (01/31/2008) Chol: 179 (01/31/2008)   HDL: 46.5 (01/31/2008)   LDL: 107 (01/31/2008)   TG: 128 (01/31/2008) stable overall by hx and exam, ok to continue meds/tx as is   Complete Medication List: 1)  Levothyroxine Sodium 112 Mcg Tabs (Levothyroxine sodium) .Marland Kitchen.. 1 by mouth once daily 2)  Lisinopril 20 Mg Tabs (Lisinopril) .Marland Kitchen.. 1 by mouth once daily 3)  Vivelle-dot 0.0375 Mg/24hr Pttw (Estradiol) .... Once daily to skin 4)  Hydrocodone-acetaminophen 5-500 Mg Tabs (Hydrocodone-acetaminophen) .... Take 1 tablet by mouth two times a day as needed 5)  Allegra-d 12 Hour 60-120 Mg Tb12 (Fexofenadine-pseudoephedrine) .Marland Kitchen.. 1 by mouth two times a day prn 6)  Fluticasone Propionate 50 Mcg/act Susp (Fluticasone propionate) .... 2 spray/side once daily   Patient Instructions: 1)  Continue all medications that you may have been taking previously 2)  Please schedule a follow-up appointment in 1 year or sooner if needed 3)  please call in december to see if we are giving the swine flu shot if you like 4)  Check your Blood Pressure regularly. If it is above 140/90: you should make an appointment.   Prescriptions: HYDROCODONE-ACETAMINOPHEN 5-500 MG TABS (HYDROCODONE-ACETAMINOPHEN) Take 1 tablet by mouth  two times a day as needed  #60 x 1   Entered and Authorized by:   Corwin Levins MD   Signed by:   Corwin Levins MD on 02/06/2008   Method used:   Print then Give to Patient   RxID:   [id number] FLUTICASONE PROPIONATE 50 MCG/ACT  SUSP (FLUTICASONE PROPIONATE) 2 spray/side once daily  #3 x 3   Entered and Authorized by:   Corwin Levins MD   Signed by:   Corwin Levins MD on 02/06/2008   Method used:   Print then Give to Patient   RxID:   [id number] ALLEGRA-D 12 HOUR 60-120 MG  TB12 (FEXOFENADINE-PSEUDOEPHEDRINE) 1 by mouth two times a day  prn  #180 x 3   Entered and Authorized by:   Corwin Levins MD   Signed by:   Corwin Levins MD on 02/06/2008   Method used:   Print then Give to Patient   RxID:   [id number] LEVOTHYROXINE SODIUM 112 MCG  TABS (LEVOTHYROXINE SODIUM) 1 by mouth once daily  #90 x 3   Entered and Authorized by:   Corwin Levins MD   Signed by:   Corwin Levins MD on 02/06/2008   Method used:   Print then Give to Patient   RxID:   [id number] LISINOPRIL 20 MG TABS (LISINOPRIL) 1 by mouth once daily  #90 x 3   Entered and Authorized by:   Corwin Levins MD   Signed by:   Corwin Levins MD on 02/06/2008   Method used:   Print then Give to Patient   RxID:   [id number]  ]

## 2010-05-06 NOTE — Assessment & Plan Note (Signed)
Summary: SORE THROAT / NO FEVER /NWS $50   Vital Signs:  Patient Profile:   55 Years Old Female Weight:      215.50 pounds Temp:     98.9 degrees F oral Pulse rate:   90 / minute BP sitting:   140 / 108  (right arm) Cuff size:   regular  Vitals Entered By: Windell Norfolk (May 18, 2008 4:02 PM)                 Chief Complaint:  SORE THROAT AND EAR PAIN.  History of Present Illness: here with 3 days acute onset ST, bilat ear pain, non prod cough for 4 to 5 days with fever but no sob, doe, orthopnea, pnd or LE edema; no CP or wheezing noted; has had some headache and malaise as well ; has also been out of BP meds for 1 wk, wt up a couple of lbs without significant excercise    Updated Prior Medication List: LEVOTHYROXINE SODIUM 112 MCG  TABS (LEVOTHYROXINE SODIUM) 1 by mouth once daily LISINOPRIL 20 MG TABS (LISINOPRIL) 1 by mouth once daily VIVELLE-DOT 0.0375 MG/24HR  PTTW (ESTRADIOL) once daily to skin HYDROCODONE-ACETAMINOPHEN 5-500 MG TABS (HYDROCODONE-ACETAMINOPHEN) Take 1 tablet by mouth two times a day as needed ALLEGRA-D 12 HOUR 60-120 MG  TB12 (FEXOFENADINE-PSEUDOEPHEDRINE) 1 by mouth two times a day prn FLUTICASONE PROPIONATE 50 MCG/ACT  SUSP (FLUTICASONE PROPIONATE) 2 spray/side once daily DOXYCYCLINE HYCLATE 100 MG CAPS (DOXYCYCLINE HYCLATE) 1 by mouth two times a day NYSTATIN 100000 UNIT/ML SUSP (NYSTATIN) 5 cc by mouth swish and spit four times per day for 10 days  Current Allergies (reviewed today): No known allergies   Past Medical History:    Reviewed history from 01/10/2007 and no changes required:       Hypothyroidism       Allergic rhinitis       GERD, mild       Hypertension       Hyperlipidemia       chronic neck pain/c-spine disc dz       Nephrolithiasis, hx of  Past Surgical History:    Reviewed history from 11/02/2006 and no changes required:       Hysterectomy- 1996       Tonsillectomy- 1961   Social History:    Reviewed history from  02/06/2008 and no changes required:       Married       Never Smoked       Alcohol use-no       2 jobs - Systems analyst area; and [employer]       2 children     Review of Systems       all otherwise negative    Physical Exam  General:     alert and overweight-appearing.   Head:     Normocephalic and atraumatic without obvious abnormalities. No apparent alopecia or balding. Eyes:     No corneal or conjunctival inflammation noted. EOMI. Perrla. Ears:     bilat tm's red, sinus nontender Nose:     nasal dischargemucosal pallor and mucosal erythema.   Mouth:     pharyngeal erythema and fair dentition.  , tongue has whitish coating Neck:     supple and cervical lymphadenopathy.   Lungs:     Normal respiratory effort, chest expands symmetrically. Lungs are clear to auscultation, no crackles or wheezes. Heart:     Normal rate and regular rhythm. S1 and S2 normal without  gallop, murmur, click, rub or other extra sounds. Extremities:     no edema, no ulcers     Impression & Recommendations:  Problem # 1:  PHARYNGITIS-ACUTE (ICD-462)  The following medications were removed from the medication list:    Azithromycin 250 Mg Tabs (Azithromycin) .Marland Kitchen... 2po qd for 1 day, then 1po qd for 4days, then stop  Her updated medication list for this problem includes:    Doxycycline Hyclate 100 Mg Caps (Doxycycline hyclate) .Marland Kitchen... 1 by mouth two times a day    Nystatin 100000 Unit/ml Susp (Nystatin) .Marland KitchenMarland KitchenMarland KitchenMarland Kitchen 5 cc by mouth swish and spit four times per day for 10 days treat as above, f/u any worsening signs or symptoms   Problem # 2:  THRUSH (ICD-112.0) treat as above, f/u any worsening signs or symptoms - see #1  Problem # 3:  HYPERTENSION, BENIGN ESSENTIAL (ICD-401.1)  Her updated medication list for this problem includes:    Lisinopril 20 Mg Tabs (Lisinopril) .Marland Kitchen... 1 by mouth once daily to re-start meds (out for 1 wk), cont to monitor BP at home, o/w stable  overall by hx and exam, ok to continue meds/tx as is   Complete Medication List: 1)  Levothyroxine Sodium 112 Mcg Tabs (Levothyroxine sodium) .Marland Kitchen.. 1 by mouth once daily 2)  Lisinopril 20 Mg Tabs (Lisinopril) .Marland Kitchen.. 1 by mouth once daily 3)  Vivelle-dot 0.0375 Mg/24hr Pttw (Estradiol) .... Once daily to skin 4)  Hydrocodone-acetaminophen 5-500 Mg Tabs (Hydrocodone-acetaminophen) .... Take 1 tablet by mouth two times a day as needed 5)  Allegra-d 12 Hour 60-120 Mg Tb12 (Fexofenadine-pseudoephedrine) .Marland Kitchen.. 1 by mouth two times a day prn 6)  Fluticasone Propionate 50 Mcg/act Susp (Fluticasone propionate) .... 2 spray/side once daily 7)  Doxycycline Hyclate 100 Mg Caps (Doxycycline hyclate) .Marland Kitchen.. 1 by mouth two times a day 8)  Nystatin 100000 Unit/ml Susp (Nystatin) .... 5 cc by mouth swish and spit four times per day for 10 days   Patient Instructions: 1)  Please take all new medications as prescribed  2)  Continue all medications that you may have been taking previously, including the Blood pressure medication  3)  Please schedule a follow-up appointment as recommended at your last office visit 4)  Check your Blood Pressure regularly. If it is above 140/90: you should make an appointment.   Prescriptions: NYSTATIN 100000 UNIT/ML SUSP (NYSTATIN) 5 cc by mouth swish and spit four times per day for 10 days  #10days x 1   Entered and Authorized by:   Corwin Levins MD   Signed by:   Corwin Levins MD on 05/18/2008   Method used:   Print then Give to Patient   RxID:   [id number] DOXYCYCLINE HYCLATE 100 MG CAPS (DOXYCYCLINE HYCLATE) 1 by mouth two times a day  #20 x 0   Entered and Authorized by:   Corwin Levins MD   Signed by:   Corwin Levins MD on 05/18/2008   Method used:   Print then Give to Patient   RxID:   [id number]

## 2010-05-06 NOTE — Progress Notes (Signed)
  Phone Note Call from Patient Call back at Home Phone [phone]   Caller: Patient Call For: dr Jonny Ruiz Complaint: Cough/Sore throat, Earache/Ear Infection Summary of Call: .per Garnetta Buddy call  saw dr Jonny Ruiz for t, ear pain, and sinus problems and achy x 1 week. on 01-05-08 still not betterr have completed antibotic s    Initial call taken by: Shelbie Proctor,  January 17, 2008 11:34 AM  Follow-up for Phone Call        ok to try clarithromycin course  - done escript Follow-up by: Corwin Levins MD,  January 17, 2008 12:32 PM  Additional Follow-up for Phone Call Additional follow up Details #1::        January 17, 2008 1:44 PM called pt to infrom that another rx wa sent to pharmacy Additional Follow-up by: Shelbie Proctor,  January 17, 2008 1:44 PM    New/Updated Medications: CLARITHROMYCIN 500 MG TABS (CLARITHROMYCIN) 1 by mouth two times a day   Prescriptions: CLARITHROMYCIN 500 MG TABS (CLARITHROMYCIN) 1 by mouth two times a day  #20 x 0   Entered and Authorized by:   Corwin Levins MD   Signed by:   Corwin Levins MD on 01/17/2008   Method used:   Electronically to        [facility]. [phone]* (retail)       [address]       [place], [place]  [address]       Ph: [phone]       Fax: [phone]   RxID:   [id number]

## 2010-05-06 NOTE — Progress Notes (Signed)
Summary: refill  Phone Note Call from Patient Call back at Work Phone [phone]   Caller: Patient Reason for Call: Refill Medication Summary of Call: Patient call requesting rx refill on hycoymine .125mg , however I didnt see it on med list. Do you recall pt being on this previously? If so is this ok to fill for her. Please advise Initial call taken by: Rock Nephew CMA,  Aug 22, 2008 2:23 PM  Follow-up for Phone Call        ok to refill  Follow-up by: Corwin Levins MD,  Aug 22, 2008 2:39 PM  Additional Follow-up for Phone Call Additional follow up Details #1::        Patient notified and sent to pharmacy of her choice Additional Follow-up by: Rock Nephew CMA,  Aug 22, 2008 3:28 PM    New/Updated Medications: HYOSCYAMINE SULFATE 0.125 MG TBDP (HYOSCYAMINE SULFATE) DISSOLVE 2 TABLETS UNDER THE TOUNGE EVERY 4 HOURS IF NEEDED   Prescriptions: HYOSCYAMINE SULFATE 0.125 MG TBDP (HYOSCYAMINE SULFATE) DISSOLVE 2 TABLETS UNDER THE TOUNGE EVERY 4 HOURS IF NEEDED  #60 x 5   Entered by:   Rock Nephew CMA   Authorized by:   Corwin Levins MD   Signed by:   Rock Nephew CMA on 08/22/2008   Method used:   Electronically to        [facility]. [phone]* (retail)       [address]       [place], [place]  [address]       Ph: [phone]       Fax: [phone]   RxID:   [id number]

## 2010-05-06 NOTE — Progress Notes (Signed)
  Phone Note Call from Patient Call back at Home Phone [phone]   Caller: [phone] Call For: dr Jonny Ruiz Summary of Call: per pt call still have recurring sinus infection requseting anothe refill on antibotic that she recieve on 5-*8-09 Initial call taken by: Shelbie Proctor,  September 13, 2007 4:04 PM  Follow-up for Phone Call        ok this time only - sent escript Follow-up by: Corwin Levins MD,  September 13, 2007 4:09 PM  Additional Follow-up for Phone Call Additional follow up Details #1::        September 13, 2007 4:12 PM called pt to inform that rx was sent spoke with pt made aware  Additional Follow-up by: Shelbie Proctor,  September 13, 2007 4:12 PM      Prescriptions: AVELOX 400 MG  TABS (MOXIFLOXACIN HCL) 1po qd  #10 x 0   Entered and Authorized by:   Corwin Levins MD   Signed by:   Corwin Levins MD on 09/13/2007   Method used:   Electronically sent to ...       [id number]       [address]       [place], [place]  [address]       Ph: [phone]       Fax: [phone]   RxID:   [id number]

## 2010-05-06 NOTE — Assessment & Plan Note (Signed)
Summary: sinus pain, pressure, ears plugged/cd   Vital Signs:  Patient profile:   55 year old female Height:      67 inches Weight:      216 pounds BMI:     33.95 O2 Sat:      98 % on Room air Temp:     98.4 degrees F oral Pulse rate:   86 / minute BP sitting:   136 / 86  (left arm) Cuff size:   regular  Vitals Entered ByZella Ball Ewing (March 14, 2009 10:30 AM)  O2 Flow:  Room air CC: sinus and ear pressure,dizzy/RE   Primary Care Provider:  Corwin Levins MD  CC:  sinus and ear pressure and dizzy/RE.  History of Present Illness: here with 2- 3 days onset facial pain, pressure, fever and left ear pain , with fever and malaise;  no St, cough and Pt denies CP, sob, doe, wheezing, orthopnea, pnd, worsening LE edema, palps, dizziness or syncope  Has seen ENT and allergy in the past , cont's to have recurrent symtpoms.    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) 4)  Thrush  (ICD-112.0) 5)  Plantar Fasciitis  (ICD-728.71) 6)  Ganglion Cyst, Wrist, Left  (ICD-727.41) 7)  Preventive Health Care  (ICD-V70.0) 8)  Headache  (ICD-784.0) 9)  Hypertension, Benign Essential  (ICD-401.1) 10)  Nephrolithiasis, Hx of  (ICD-V13.01) 11)  Family History Diabetes 1st Degree Relative  (ICD-V18.0) 12)  Cervical Radiculopathy  (ICD-723.4) 13)  Hyperlipidemia  (ICD-272.4) 14)  Hypertension  (ICD-401.9) 15)  Gerd  (ICD-530.81) 16)  Allergic Rhinitis  (ICD-477.9) 17)  Hypothyroidism  (ICD-244.9)  Medications Prior to Update: 1)  Levothyroxine Sodium 112 Mcg  Tabs (Levothyroxine Sodium) .Marland Kitchen.. 1 By Mouth Once Daily 2)  Lisinopril 20 Mg Tabs (Lisinopril) .Marland Kitchen.. 1 By Mouth Once Daily 3)  Vivelle-Dot 0.0375 Mg/24hr  Pttw (Estradiol) .... Once Daily To Skin 4)  Hydrocodone-Acetaminophen 5-500 Mg Tabs (Hydrocodone-Acetaminophen) .... Take 1 Tablet By Mouth Two Times A Day As Needed 5)  Allegra-D 12 Hour  60-120 Mg  Tb12 (Fexofenadine-Pseudoephedrine) .Marland Kitchen.. 1 By Mouth Two Times A Day Prn 6)  Fluticasone Propionate 50 Mcg/act  Susp (Fluticasone Propionate) .... 2 Spray/side Once Daily 7)  Hyoscyamine Sulfate 0.125 Mg Tbdp (Hyoscyamine Sulfate) .... Dissolve 2 Tablets Under The Tounge Every 4 Hours If Needed  Current Medications (verified): 1)  Levothyroxine Sodium 112 Mcg  Tabs (Levothyroxine Sodium) .Marland Kitchen.. 1 By Mouth Once Daily 2)  Lisinopril 20 Mg Tabs (Lisinopril) .Marland Kitchen.. 1 By Mouth Once Daily 3)  Vivelle-Dot 0.0375 Mg/24hr  Pttw (Estradiol) .... Once Daily To Skin 4)  Hydrocodone-Acetaminophen 5-500 Mg Tabs (Hydrocodone-Acetaminophen) .... Take 1 Tablet By Mouth Two Times A Day As Needed 5)  Allegra-D 12 Hour 60-120 Mg  Tb12 (Fexofenadine-Pseudoephedrine) .Marland Kitchen.. 1 By Mouth Two Times A Day Prn 6)  Fluticasone Propionate 50 Mcg/act  Susp (Fluticasone Propionate) .... 2 Spray/side Once Daily 7)  Hyoscyamine Sulfate 0.125 Mg Tbdp (Hyoscyamine Sulfate) .... Dissolve 2 Tablets Under The Tounge Every 4 Hours If Needed 8)  Levaquin 500 Mg Tabs (Levofloxacin) .Marland Kitchen.. 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  , mild ill  Head:  normocephalic and atraumatic.   Eyes:  vision grossly intact, pupils equal, and pupils round.   Ears:  left tm with severe erythema,  right tm ok, canals clear, sinus tender mild bilat max areas Nose:  nasal dischargemucosal pallor and mucosal erythema.   Mouth:  pharyngeal erythema and fair dentition.   Neck:  supple and cervical lymphadenopathy.   Lungs:  normal respiratory effort and normal breath sounds.   Heart:  normal rate and regular rhythm.   Extremities:  no edema, no erythema    Impression & Recommendations:  Problem # 1:  OTITIS MEDIA, LEFT (ICD-382.9)  wit some sinusitis as well ; treat as above, f/u any worsening signs or symptoms , cont all other meds, and mucinex as needed   Her updated medication list for this problem includes:    Levaquin 500 Mg Tabs (Levofloxacin) .Marland Kitchen... 1po once daily treat as above, f/u any worsening signs or symptoms   Problem # 2:  HYPERTENSION (ICD-401.9)  Her updated medication list for this problem includes:    Lisinopril 20 Mg Tabs (Lisinopril) .Marland Kitchen... 1 by mouth once daily  BP today: 136/86 Prior BP: 118/82 (02/06/2009)  Labs Reviewed: K+: 4.0 (02/01/2009) Creat: : 0.7 (02/01/2009)   Chol: 234 (02/01/2009)   HDL: 53.80 (02/01/2009)   LDL: 107 (01/31/2008)   TG: 211.0 (02/01/2009) stable overall by hx and exam, ok to continue meds/tx as is   Problem # 3:  CHRONIC MAXILLARY SINUSITIS (ICD-473.0)  Her updated medication list for this problem includes:    Allegra-d 12 Hour 60-120 Mg Tb12 (Fexofenadine-pseudoephedrine) .Marland Kitchen... 1 by mouth two times a day prn    Fluticasone Propionate 50 Mcg/act Susp (Fluticasone propionate) .Marland Kitchen... 2 spray/side once daily    Levaquin 500 Mg Tabs (Levofloxacin) .Marland Kitchen... 1po once daily Continue all previous medications as before this visit , o/w stable   Complete Medication List: 1)  Levothyroxine Sodium 112 Mcg Tabs (Levothyroxine sodium) .Marland Kitchen.. 1 by mouth once daily 2)  Lisinopril 20 Mg Tabs (Lisinopril) .Marland Kitchen.. 1 by mouth once daily 3)  Vivelle-dot 0.0375 Mg/24hr Pttw (Estradiol) .... Once daily to skin 4)  Hydrocodone-acetaminophen 5-500 Mg Tabs  (Hydrocodone-acetaminophen) .... Take 1 tablet by mouth two times a day as needed 5)  Allegra-d 12 Hour 60-120 Mg Tb12 (Fexofenadine-pseudoephedrine) .Marland Kitchen.. 1 by mouth two times a day prn 6)  Fluticasone Propionate 50 Mcg/act Susp (Fluticasone propionate) .... 2 spray/side once daily 7)  Hyoscyamine Sulfate 0.125 Mg Tbdp (Hyoscyamine sulfate) .... Dissolve 2 tablets under the tounge every 4 hours if needed 8)  Levaquin 500 Mg Tabs (Levofloxacin) .Marland Kitchen.. 1po once daily  Patient Instructions: 1)  Please take all new medications as prescribed 2)  Continue all previous medications as before this visit  3)  Please schedule a follow-up appointment as needed. Prescriptions: LEVAQUIN 500 MG TABS (LEVOFLOXACIN) 1po once daily  #14 x 0   Entered and Authorized by:   Corwin Levins MD   Signed by:   Corwin Levins MD on 03/14/2009   Method used:   Print then Give to Patient   RxID:   [id number]

## 2010-05-06 NOTE — Assessment & Plan Note (Signed)
Summary: SINUS PROBLEM-$50-STC   Vital Signs:  Patient Profile:   55 Years Old Female Weight:      198 pounds Temp:     97.5 degrees F oral Pulse rate:   95 / minute BP sitting:   138 / 75  (right arm) Cuff size:   regular  Pt. in pain?   no  Vitals Entered By: Maris Berger (Aug 12, 2007 10:20 AM)                  Chief Complaint:  sinus trouble.  History of Present Illness: here with recurrent left sided facial pain, pressure, fever and greenish d/c; all on top of allergic symptoms worse in the past few weks; also needs refill on her vicodin for chronic neck pain that she tried to minimize use    Updated Prior Medication List: LEVOTHROID 125 MCG TABS (LEVOTHYROXINE SODIUM) 1 by mouth qd LISINOPRIL 20 MG TABS (LISINOPRIL) 1 by mouth qd VIVELLE-DOT 0.0375 MG/24HR  PTTW (ESTRADIOL) once daily to skin HYDROCODONE-ACETAMINOPHEN 5-500 MG TABS (HYDROCODONE-ACETAMINOPHEN) Take 1 tablet by mouth two times a day prn SINGULAIR 10 MG TABS (MONTELUKAST SODIUM) Take 1 tablet by mouth once a day ALLEGRA-D 12 HOUR 60-120 MG  TB12 (FEXOFENADINE-PSEUDOEPHEDRINE) 1 by mouth two times a day prn PROMETHAZINE-CODEINE 6.25-10 MG/5ML  SYRP (PROMETHAZINE-CODEINE) 1 tsp by mouth qid prn FLUTICASONE PROPIONATE 50 MCG/ACT  SUSP (FLUTICASONE PROPIONATE) 2 spray/side qd AVELOX 400 MG  TABS (MOXIFLOXACIN HCL) 1po qd PREDNISONE 10 MG  TABS (PREDNISONE) 3po qd for 3days, then 2po qd for 3days, then 1po qd for 3days, then stop  Current Allergies (reviewed today): No known allergies   Past Medical History:    Reviewed history from 01/10/2007 and no changes required:       Hypothyroidism       Allergic rhinitis       GERD, mild       Hypertension       Hyperlipidemia       chronic neck pain/c-spine disc dz       Nephrolithiasis, hx of  Past Surgical History:    Reviewed history from 11/02/2006 and no changes required:       Hysterectomy- 1996       Tonsillectomy- 1961   Social  History:    Reviewed history from 01/10/2007 and no changes required:       Married       Never Smoked       Alcohol use-no     Physical Exam  General:     midl ill Head:     Normocephalic and atraumatic without obvious abnormalities. No apparent alopecia or balding. Eyes:     No corneal or conjunctival inflammation noted. EOMI. Perrla.  Ears:     left TM severe red, left max sinus area severe tender Nose:     nasal dischargemucosal pallor and mucosal erythema.   Mouth:     pharyngeal erythema.   Neck:     cervical lymphadenopathy.   Lungs:     Normal respiratory effort, chest expands symmetrically. Lungs are clear to auscultation, no crackles or wheezes. Heart:     Normal rate and regular rhythm. S1 and S2 normal without gallop, murmur, click, rub or other extra sounds. Extremities:     no edema, no ulcers     Impression & Recommendations:  Problem # 1:  SINUSITIS- ACUTE-NOS (ICD-461.9)  The following medications were removed from the medication list:    Levaquin 500  Mg Tabs (Levofloxacin) .Marland Kitchen... 1po qd  Her updated medication list for this problem includes:    Allegra-d 12 Hour 60-120 Mg Tb12 (Fexofenadine-pseudoephedrine) .Marland Kitchen... 1 by mouth two times a day prn    Promethazine-codeine 6.25-10 Mg/84ml Syrp (Promethazine-codeine) .Marland Kitchen... 1 tsp by mouth qid prn    Fluticasone Propionate 50 Mcg/act Susp (Fluticasone propionate) .Marland Kitchen... 2 spray/side qd    Avelox 400 Mg Tabs (Moxifloxacin hcl) .Marland Kitchen... 1po once daily    prednisone short course taper off  Instructed on treatment. Call if symptoms persist or worsen.   treat as above, f/u any worsening signs or symptoms ; refer ENT for recurrent infections Orders: ENT Referral (ENT)  The following medications were removed from the medication list:    Levaquin 500 Mg Tabs (Levofloxacin) .Marland Kitchen... 1po qd  Her updated medication list for this problem includes:    Allegra-d 12 Hour 60-120 Mg Tb12 (Fexofenadine-pseudoephedrine) .Marland Kitchen... 1  by mouth two times a day prn    Promethazine-codeine 6.25-10 Mg/58ml Syrp (Promethazine-codeine) .Marland Kitchen... 1 tsp by mouth qid prn    Fluticasone Propionate 50 Mcg/act Susp (Fluticasone propionate) .Marland Kitchen... 2 spray/side qd    Avelox 400 Mg Tabs (Moxifloxacin hcl) .Marland Kitchen... 1po qd   Problem # 2:  CERVICAL RADICULOPATHY (ICD-723.4) refill vicodin - only using once per day prn  Problem # 3:  ALLERGIC RHINITIS (ICD-477.9)  Her updated medication list for this problem includes:    Fluticasone Propionate 50 Mcg/act Susp (Fluticasone propionate) .Marland Kitchen... 2 spray/side qd treat as above, f/u any worsening signs or symptoms   Problem # 4:  HYPERTENSION (ICD-401.9)  Her updated medication list for this problem includes:    Lisinopril 20 Mg Tabs (Lisinopril) .Marland Kitchen... 1 by mouth qd  BP today: 138/75 Prior BP: 132/79 (06/22/2007)  Labs Reviewed: Creat: 0.7 (12/30/2006) Chol: 185 (12/30/2006)   HDL: 57.3 (12/30/2006)   LDL: 106 (12/30/2006)   TG: 111 (12/30/2006)  stable overall by hx and exam, ok to continue meds/tx as is    Complete Medication List: 1)  Levothroid 125 Mcg Tabs (Levothyroxine sodium) .Marland Kitchen.. 1 by mouth qd 2)  Lisinopril 20 Mg Tabs (Lisinopril) .Marland Kitchen.. 1 by mouth qd 3)  Vivelle-dot 0.0375 Mg/24hr Pttw (Estradiol) .... Once daily to skin 4)  Hydrocodone-acetaminophen 5-500 Mg Tabs (Hydrocodone-acetaminophen) .... Take 1 tablet by mouth two times a day prn 5)  Singulair 10 Mg Tabs (Montelukast sodium) .... Take 1 tablet by mouth once a day 6)  Allegra-d 12 Hour 60-120 Mg Tb12 (Fexofenadine-pseudoephedrine) .Marland Kitchen.. 1 by mouth two times a day prn 7)  Promethazine-codeine 6.25-10 Mg/59ml Syrp (Promethazine-codeine) .Marland Kitchen.. 1 tsp by mouth qid prn 8)  Fluticasone Propionate 50 Mcg/act Susp (Fluticasone propionate) .... 2 spray/side qd 9)  Avelox 400 Mg Tabs (Moxifloxacin hcl) .Marland Kitchen.. 1po qd 10)  Prednisone 10 Mg Tabs (Prednisone) .... 3po qd for 3days, then 2po qd for 3days, then 1po qd for 3days, then stop    Patient Instructions: 1)  take all new medications as prescribed 2)  continue all medications that you may have been taking previously 3)  Please schedule a follow-up appointment as needed. 4)  you will be contacted about the referral(s) to: ENT   Prescriptions: HYDROCODONE-ACETAMINOPHEN 5-500 MG TABS (HYDROCODONE-ACETAMINOPHEN) Take 1 tablet by mouth two times a day prn  #60 x 1   Entered and Authorized by:   Corwin Levins MD   Signed by:   Corwin Levins MD on 08/12/2007   Method used:   Print then Give to Patient  RxID:   [id number] PREDNISONE 10 MG  TABS (PREDNISONE) 3po qd for 3days, then 2po qd for 3days, then 1po qd for 3days, then stop  #18 x 0   Entered and Authorized by:   Corwin Levins MD   Signed by:   Corwin Levins MD on 08/12/2007   Method used:   Print then Give to Patient   RxID:   [id number] AVELOX 400 MG  TABS (MOXIFLOXACIN HCL) 1po qd  #10 x 0   Entered and Authorized by:   Corwin Levins MD   Signed by:   Corwin Levins MD on 08/12/2007   Method used:   Print then Give to Patient   RxID:   [id number]  ]

## 2010-05-06 NOTE — Assessment & Plan Note (Signed)
Summary: SINUS PROBLEM-STC   Vital Signs:  Patient Profile:   55 Years Old Female Weight:      176 pounds Temp:     98.6 degrees F oral Pulse rate:   90 / minute BP sitting:   133 / 74  (right arm) Cuff size:   regular  Vitals Entered By: Maris Berger (February 09, 2007 3:33 PM)                 Chief Complaint:  sinus trouble.  History of Present Illness: here with acute onset sinus pain and tenderness, especially the left maxillary area and frontotemporal area of the head, with ? low grade temp.  Pain not   Current Allergies (reviewed today): No known allergies  Updated/Current Medications (including changes made in today's visit):  LEVOTHROID 125 MCG TABS (LEVOTHYROXINE SODIUM) 1 by mouth qd LISINOPRIL 20 MG TABS (LISINOPRIL) 1 by mouth qd VIVELLE-DOT 0.0375 MG/24HR  PTTW (ESTRADIOL) once daily to skin AVELOX 400 MG TABS (MOXIFLOXACIN HCL) Take 1 tablet by mouth once a day HYDROCODONE-ACETAMINOPHEN 5-500 MG TABS (HYDROCODONE-ACETAMINOPHEN) Take 1 tablet by mouth four times a day SINGULAIR 10 MG TABS (MONTELUKAST SODIUM) Take 1 tablet by mouth once a day TUSSIONEX PENNKINETIC ER 8-10 MG/5ML LQCR (CHLORPHENIRAMINE-HYDROCODONE) Apply 1 teaspoon by mouth twice a day ALLEGRA-D 12 HOUR 60-120 MG  TB12 (FEXOFENADINE-PSEUDOEPHEDRINE) 1 by mouth two times a day prn DOXYCYCLINE HYCLATE 100 MG  TABS (DOXYCYCLINE HYCLATE) 1 by mouth bid   Past Medical History:    Reviewed history from 01/10/2007 and no changes required:       Hypothyroidism       Allergic rhinitis       GERD, mild       Hypertension       Hyperlipidemia       chronic neck pain/c-spine disc dz       Nephrolithiasis, hx of   Family History:    Reviewed history from 01/10/2007 and no changes required:       Family History Diabetes 1st degree relative       Family History Hypertension  Social History:    Reviewed history from 01/10/2007 and no changes required:       Married       Never Smoked       Alcohol use-no     Physical Exam  General:     mild ill appearing Head:     Normocephalic and atraumatic without obvious abnormalities. No apparent alopecia or balding. Eyes:     No corneal or conjunctival inflammation noted. EOMI. Perrla.  Ears:     bilat tm's mild red; left max sinus area markedly tender Nose:     nasal dischargemucosal pallor.   Mouth:     pharyngeal erythema.   Neck:     cervical lymphadenopathy.   Lungs:     Normal respiratory effort, chest expands symmetrically. Lungs are clear to auscultation, no crackles or wheezes. Heart:     Normal rate and regular rhythm. S1 and S2 normal without gallop, murmur, click, rub or other extra sounds. Extremities:     no edema, no rash    Impression & Recommendations:  Problem # 1:  SINUSITIS- ACUTE-NOS (ICD-461.9)  The following medications were removed from the medication list:    Pseudovent 400 120-400 Mg Cp12 (Pseudoephedrine-guaifenesin) .Marland Kitchen... Take 1 capsule by mouth twice a day    Avelox 400 Mg Tabs (Moxifloxacin hcl) .Marland Kitchen... 1 by mouth qd  Her updated medication  list for this problem includes:    Avelox 400 Mg Tabs (Moxifloxacin hcl) .Marland Kitchen... Take 1 tablet by mouth once a day    Tussionex Pennkinetic Er 8-10 Mg/40ml Lqcr (Chlorpheniramine-hydrocodone) .Marland Kitchen... Apply 1 teaspoon by mouth twice a day    Allegra-d 12 Hour 60-120 Mg Tb12 (Fexofenadine-pseudoephedrine) .Marland Kitchen... 1 by mouth two times a day prn    Doxycycline Hyclate 100 Mg Tabs (Doxycycline hyclate) .Marland Kitchen... 1 by mouth two times a day  d/c the avelox, change to doxycycine course, f/u prn  The following medications were removed from the medication list:    Pseudovent 400 120-400 Mg Cp12 (Pseudoephedrine-guaifenesin) .Marland Kitchen... Take 1 capsule by mouth twice a day    Avelox 400 Mg Tabs (Moxifloxacin hcl) .Marland Kitchen... 1 by mouth qd  Her updated medication list for this problem includes:    Avelox 400 Mg Tabs (Moxifloxacin hcl) .Marland Kitchen... Take 1 tablet by mouth once a day     Tussionex Pennkinetic Er 8-10 Mg/47ml Lqcr (Chlorpheniramine-hydrocodone) .Marland Kitchen... Apply 1 teaspoon by mouth twice a day    Allegra-d 12 Hour 60-120 Mg Tb12 (Fexofenadine-pseudoephedrine) .Marland Kitchen... 1 by mouth two times a day prn    Doxycycline Hyclate 100 Mg Tabs (Doxycycline hyclate) .Marland Kitchen... 1 by mouth bid   Problem # 2:  HEADACHE (ICD-784.0)  I wonder if part of her head pain could be from migraine, consider tx if above not helping The following medications were removed from the medication list:    Vicodin 5-500 Mg Tabs (Hydrocodone-acetaminophen)  Her updated medication list for this problem includes:    Hydrocodone-acetaminophen 5-500 Mg Tabs (Hydrocodone-acetaminophen) .Marland Kitchen... Take 1 tablet by mouth four times a day   Problem # 3:  HYPERTENSION, BENIGN ESSENTIAL (ICD-401.1)  The following medications were removed from the medication list:    Lisinopril 10 Mg Tabs (Lisinopril)  Her updated medication list for this problem includes:    Lisinopril 20 Mg Tabs (Lisinopril) .Marland Kitchen... 1 by mouth once daily o/w stable, cont meds  The following medications were removed from the medication list:    Lisinopril 10 Mg Tabs (Lisinopril)  Her updated medication list for this problem includes:    Lisinopril 20 Mg Tabs (Lisinopril) .Marland Kitchen... 1 by mouth qd   Complete Medication List: 1)  Levothroid 125 Mcg Tabs (Levothyroxine sodium) .Marland Kitchen.. 1 by mouth qd 2)  Lisinopril 20 Mg Tabs (Lisinopril) .Marland Kitchen.. 1 by mouth qd 3)  Vivelle-dot 0.0375 Mg/24hr Pttw (Estradiol) .... Once daily to skin 4)  Avelox 400 Mg Tabs (Moxifloxacin hcl) .... Take 1 tablet by mouth once a day 5)  Hydrocodone-acetaminophen 5-500 Mg Tabs (Hydrocodone-acetaminophen) .... Take 1 tablet by mouth four times a day 6)  Singulair 10 Mg Tabs (Montelukast sodium) .... Take 1 tablet by mouth once a day 7)  Tussionex Pennkinetic Er 8-10 Mg/9ml Lqcr (Chlorpheniramine-hydrocodone) .... Apply 1 teaspoon by mouth twice a day 8)  Allegra-d 12 Hour 60-120 Mg  Tb12 (Fexofenadine-pseudoephedrine) .Marland Kitchen.. 1 by mouth two times a day prn 9)  Doxycycline Hyclate 100 Mg Tabs (Doxycycline hyclate) .Marland Kitchen.. 1 by mouth bid     Prescriptions: DOXYCYCLINE HYCLATE 100 MG  TABS (DOXYCYCLINE HYCLATE) 1 by mouth bid  #20 x 0   Entered and Authorized by:   Corwin Levins MD   Signed by:   Corwin Levins MD on 02/09/2007   Method used:   Electronically sent to ...       [id number]       [address]  [place], [place]  [address]       Ph: [phone]       Fax: [phone]   RxID:   [id number]  ]

## 2010-05-06 NOTE — Assessment & Plan Note (Signed)
Summary: SCRATCHY THROAT/ SINUS/ EARS / NO FEVER / BODY ACHES/ NWS   Vital Signs:  Patient Profile:   55 Years Old Female Weight:      204 pounds Temp:     97.9 degrees F oral Pulse rate:   88 / minute BP sitting:   116 / 80  (left arm) Cuff size:   regular  Vitals Entered By: Payton Spark CMA (January 05, 2008 4:11 PM)                 Chief Complaint:  st, ear pain, and sinus problems and achy x 1 week.  History of Present Illness: here with 1 wk ST, bilat earache, myalgias, and sinus pressure and pain, greenish d/c and fever; saw ENT recently with neg exam    Updated Prior Medication List: LEVOTHYROXINE SODIUM 112 MCG  TABS (LEVOTHYROXINE SODIUM) 1 by mouth once daily LISINOPRIL 20 MG TABS (LISINOPRIL) 1 by mouth qd VIVELLE-DOT 0.0375 MG/24HR  PTTW (ESTRADIOL) once daily to skin HYDROCODONE-ACETAMINOPHEN 5-500 MG TABS (HYDROCODONE-ACETAMINOPHEN) Take 1 tablet by mouth two times a day prn SINGULAIR 10 MG TABS (MONTELUKAST SODIUM) Take 1 tablet by mouth once a day ALLEGRA-D 12 HOUR 60-120 MG  TB12 (FEXOFENADINE-PSEUDOEPHEDRINE) 1 by mouth two times a day prn FLUTICASONE PROPIONATE 50 MCG/ACT  SUSP (FLUTICASONE PROPIONATE) 2 spray/side qd LEVAQUIN 500 MG TABS (LEVOFLOXACIN) 1 by mouth once daily  Current Allergies (reviewed today): No known allergies   Past Medical History:    Reviewed history from 01/10/2007 and no changes required:       Hypothyroidism       Allergic rhinitis       GERD, mild       Hypertension       Hyperlipidemia       chronic neck pain/c-spine disc dz       Nephrolithiasis, hx of  Past Surgical History:    Reviewed history from 11/02/2006 and no changes required:       Hysterectomy- 1996       Tonsillectomy- 1961   Social History:    Reviewed history from 01/10/2007 and no changes required:       Married       Never Smoked       Alcohol use-no    Review of Systems       all otherwise negative    Physical Exam  General:  alert and overweight-appearing.  , mild ill  Head:     Normocephalic and atraumatic without obvious abnormalities. No apparent alopecia or balding. Eyes:     No corneal or conjunctival inflammation noted. EOMI. Perrla. Ears:     bilat tm's red, sinus tender bilat Nose:     nasal dischargemucosal pallor and mucosal erythema.   Mouth:     pharyngeal erythema and fair dentition.   Neck:     No deformities, masses, or tenderness noted. Lungs:     Normal respiratory effort, chest expands symmetrically. Lungs are clear to auscultation, no crackles or wheezes. Heart:     Normal rate and regular rhythm. S1 and S2 normal without gallop, murmur, click, rub or other extra sounds. Extremities:     no edema, no ulcers     Impression & Recommendations:  Problem # 1:  SINUSITIS- ACUTE-NOS (ICD-461.9)  The following medications were removed from the medication list:    Promethazine-codeine 6.25-10 Mg/37ml Syrp (Promethazine-codeine) .Marland Kitchen... 1 tsp by mouth qid prn  Her updated medication list for this problem includes:  Allegra-d 12 Hour 60-120 Mg Tb12 (Fexofenadine-pseudoephedrine) .Marland Kitchen... 1 by mouth two times a day prn    Fluticasone Propionate 50 Mcg/act Susp (Fluticasone propionate) .Marland Kitchen... 2 spray/side qd    Levaquin 500 Mg Tabs (Levofloxacin) .Marland Kitchen... 1 by mouth once daily   Problem # 2:  HYPERTENSION, BENIGN ESSENTIAL (ICD-401.1)  Her updated medication list for this problem includes:    Lisinopril 20 Mg Tabs (Lisinopril) .Marland Kitchen... 1 by mouth qd  BP today: 116/80 Prior BP: 138/75 (08/12/2007)  Labs Reviewed: Creat: 0.7 (12/30/2006) Chol: 185 (12/30/2006)   HDL: 57.3 (12/30/2006)   LDL: 106 (12/30/2006)   TG: 111 (12/30/2006) stable overall by hx and exam, ok to continue meds/tx as is   Complete Medication List: 1)  Levothyroxine Sodium 112 Mcg Tabs (Levothyroxine sodium) .Marland Kitchen.. 1 by mouth once daily 2)  Lisinopril 20 Mg Tabs (Lisinopril) .Marland Kitchen.. 1 by mouth qd 3)  Vivelle-dot 0.0375 Mg/24hr  Pttw (Estradiol) .... Once daily to skin 4)  Hydrocodone-acetaminophen 5-500 Mg Tabs (Hydrocodone-acetaminophen) .... Take 1 tablet by mouth two times a day prn 5)  Singulair 10 Mg Tabs (Montelukast sodium) .... Take 1 tablet by mouth once a day 6)  Allegra-d 12 Hour 60-120 Mg Tb12 (Fexofenadine-pseudoephedrine) .Marland Kitchen.. 1 by mouth two times a day prn 7)  Fluticasone Propionate 50 Mcg/act Susp (Fluticasone propionate) .... 2 spray/side qd 8)  Levaquin 500 Mg Tabs (Levofloxacin) .Marland Kitchen.. 1 by mouth once daily   Patient Instructions: 1)  Please take all new medications as prescribed 2)  Continue all medications that you may have been taking previously 3)  followup next month as planned   Prescriptions: LEVAQUIN 500 MG TABS (LEVOFLOXACIN) 1 by mouth once daily  #10 x 0   Entered and Authorized by:   Corwin Levins MD   Signed by:   Corwin Levins MD on 01/05/2008   Method used:   Print then Give to Patient   RxID:   [id number]  ]

## 2010-05-06 NOTE — Progress Notes (Signed)
  Phone Note Refill Request  on February 21, 2009 11:56 AM  Refills Requested: Medication #1:  FLUTICASONE PROPIONATE 50 MCG/ACT  SUSP 2 spray/side once daily   Dosage confirmed as above?Dosage Confirmed   Notes: [facility] [address] Initial call taken by: Scharlene Gloss,  February 21, 2009 11:57 AM    Prescriptions: FLUTICASONE PROPIONATE 50 MCG/ACT  SUSP (FLUTICASONE PROPIONATE) 2 spray/side once daily  #3 x 3   Entered by:   Scharlene Gloss   Authorized by:   Corwin Levins MD   Signed by:   Scharlene Gloss on 02/21/2009   Method used:   Electronically to        [facility]. [phone]* (retail)       [address]       [place], [place]  [address]       Ph: [phone]       Fax: [phone]   RxID:   [id number]

## 2010-05-06 NOTE — Assessment & Plan Note (Signed)
Summary: CPX / $50 /NWS   Vital Signs:  Patient profile:   55 year old female Height:      67 inches Weight:      220 pounds BMI:     34.58 O2 Sat:      98 % on Room air Temp:     98.1 degrees F oral Pulse rate:   77 / minute BP sitting:   118 / 82  (left arm) Cuff size:   large  Vitals Entered ByZella Ball Ewing (February 06, 2009 8:41 AM)  O2 Flow:  Room air  Preventive Care Screening  Pap Smear:    Date:  09/04/2008    Results:  normal   Mammogram:    Date:  09/04/2008    Results:  normal   Bone Density:    Date:  12/06/2007    Results:  normal per pt per GYN std dev  CC: adu;t physical/RE   Primary Care Provider:  Corwin Levins MD  CC:  adu;t physical/RE.  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) 2)  Thrush  (ICD-112.0) 3)  Plantar Fasciitis  (ICD-728.71) 4)  Ganglion Cyst, Wrist, Left  (ICD-727.41) 5)  Preventive Health Care  (ICD-V70.0) 6)  Headache  (ICD-784.0) 7)  Hypertension, Benign Essential  (ICD-401.1) 8)  Nephrolithiasis, Hx of  (ICD-V13.01) 9)  Family History Diabetes 1st Degree Relative  (ICD-V18.0) 10)  Cervical Radiculopathy  (ICD-723.4) 11)  Hyperlipidemia  (ICD-272.4) 12)  Hypertension  (ICD-401.9) 13)  Gerd  (ICD-530.81) 14)  Allergic Rhinitis  (ICD-477.9) 15)  Hypothyroidism  (ICD-244.9)  Medications Prior to Update: 1)  Levothyroxine Sodium 112 Mcg  Tabs (Levothyroxine Sodium) .Marland Kitchen.. 1 By Mouth Once Daily 2)  Lisinopril 20 Mg Tabs (Lisinopril) .Marland Kitchen.. 1 By Mouth  Once Daily 3)  Vivelle-Dot 0.0375 Mg/24hr  Pttw (Estradiol) .... Once Daily To Skin 4)  Hydrocodone-Acetaminophen 5-500 Mg Tabs (Hydrocodone-Acetaminophen) .... Take 1 Tablet By Mouth Two Times A Day As Needed 5)  Allegra-D 12 Hour 60-120 Mg  Tb12 (Fexofenadine-Pseudoephedrine) .Marland Kitchen.. 1 By Mouth Two Times A Day Prn 6)  Fluticasone Propionate 50 Mcg/act  Susp (Fluticasone Propionate) .... 2 Spray/side Once Daily 7)  Hyoscyamine Sulfate 0.125 Mg Tbdp (Hyoscyamine Sulfate) .... Dissolve 2 Tablets Under The Tounge Every 4 Hours If Needed  Current Medications (verified): 1)  Levothyroxine Sodium 112 Mcg  Tabs (Levothyroxine Sodium) .Marland Kitchen.. 1 By Mouth Once Daily 2)  Lisinopril 20 Mg Tabs (Lisinopril) .Marland Kitchen.. 1 By Mouth Once Daily 3)  Vivelle-Dot 0.0375 Mg/24hr  Pttw (Estradiol) .... Once Daily To Skin 4)  Hydrocodone-Acetaminophen 5-500 Mg Tabs (Hydrocodone-Acetaminophen) .... Take 1 Tablet By Mouth Two Times A Day As Needed 5)  Allegra-D 12 Hour 60-120 Mg  Tb12 (Fexofenadine-Pseudoephedrine) .Marland Kitchen.. 1 By Mouth Two Times A Day Prn 6)  Fluticasone Propionate 50 Mcg/act  Susp (Fluticasone Propionate) .... 2 Spray/side Once Daily 7)  Hyoscyamine Sulfate 0.125 Mg Tbdp (Hyoscyamine Sulfate) .... 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         all otherwise negative per pt -  Physical Exam  General:  alert and overweight-appearing.   Head:  normocephalic and atraumatic.   Eyes:  vision grossly intact, pupils equal, and pupils round.   Ears:  R ear normal and L ear normal.   Nose:  no external deformity and no nasal discharge.   Mouth:  no gingival abnormalities and pharynx pink and moist.   Neck:  supple and no masses.   Lungs:  normal respiratory effort and normal breath sounds.   Heart:  normal rate and regular rhythm.   Abdomen:  soft, non-tender, and normal bowel sounds.   Msk:  no joint tenderness and no joint swelling.   Extremities:  /no edema, no erythema Neurologic:  cranial nerves II-XII intact and strength normal in all extremities.     Impression & Recommendations:  Problem # 1:  Preventive Health Care (ICD-V70.0)  Overall doing well, up to date, counseled on routine health concerns for screening and prevention, immunizations up to date or declined, labs reviewed, ecg reviewed   Orders: EKG w/ Interpretation (93000)  Problem # 2:  HYPOTHYROIDISM (ICD-244.9)  Her updated medication list for this problem includes:    Levothyroxine Sodium 112 Mcg Tabs (Levothyroxine sodium) .Marland Kitchen... 1 by mouth once daily now with severe elevation TSH but only mild missing of doses - will cont dialy tx as is, re-check in 4 wks (? lab error involved)  Problem # 3:  HYPERSOMNIA (ICD-780.54) ? 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 1 by mouth two times a day prn    Fluticasone Propionate 50 Mcg/act Susp (Fluticasone propionate) .Marland Kitchen... 2 spray/side once daily stable overall by hx and exam, ok to continue meds/tx as is   Her updated medication list for this problem includes:    Allegra-d 12 Hour 60-120 Mg Tb12 (Fexofenadine-pseudoephedrine) .Marland Kitchen... 1 by mouth two times a day prn    Fluticasone Propionate 50 Mcg/act Susp (Fluticasone propionate) .Marland Kitchen... 2 spray/side once daily  Problem # 5:  HYPERLIPIDEMIA (ICD-272.4) with marked elev LDL this yr - ? related to TSH elevation, vs diet vs both or lab error - to re-che ck 4 wks, cont same dose for now  Complete Medication List: 1)  Levothyroxine Sodium 112 Mcg Tabs (Levothyroxine sodium) .Marland Kitchen.. 1 by mouth once daily 2)  Lisinopril 20 Mg Tabs (Lisinopril) .Marland KitchenMarland KitchenMarland Kitchen  1 by mouth once daily 3)  Vivelle-dot 0.0375 Mg/24hr Pttw (Estradiol) .... Once daily to skin 4)  Hydrocodone-acetaminophen 5-500 Mg Tabs (Hydrocodone-acetaminophen) .... Take 1 tablet by mouth two times a day as needed 5)  Allegra-d 12 Hour 60-120 Mg Tb12 (Fexofenadine-pseudoephedrine) .Marland Kitchen.. 1 by mouth two times a day prn 6)  Fluticasone Propionate 50 Mcg/act Susp (Fluticasone propionate) .... 2 spray/side once daily 7)  Hyoscyamine Sulfate 0.125 Mg Tbdp (Hyoscyamine sulfate) .... Dissolve 2 tablets under the tounge every 4 hours if needed  Other Orders: Tdap => 80yrs IM (21308) Admin 1st Vaccine (65784)  Patient Instructions: 1)  please return in 4 wks for TSH only : 244.8 2)  you had the tetanus shot today 3)  Continue all previous medications as before this visit  4)  Please schedule a follow-up appointment in 1 year or sooner if needed 5)  please call if you  feel you need the referral to pulmonary to look into the possible sleep apnea Prescriptions: HYDROCODONE-ACETAMINOPHEN 5-500 MG TABS (HYDROCODONE-ACETAMINOPHEN) Take 1 tablet by mouth two times a day as needed  #60 x 2   Entered and Authorized by:   Corwin Levins MD   Signed by:   Corwin Levins MD on 02/06/2009   Method used:   Print then Give to Patient   RxID:   [id number] LISINOPRIL 20 MG TABS (LISINOPRIL) 1 by mouth once daily  #90 x 3   Entered and Authorized by:   Corwin Levins MD   Signed by:   Corwin Levins MD on 02/06/2009   Method used:   Print then Give to Patient   RxID:   [id number] LEVOTHYROXINE SODIUM 112 MCG  TABS (LEVOTHYROXINE SODIUM) 1 by mouth once daily  #90 x 3   Entered and Authorized by:   Corwin Levins MD   Signed by:   Corwin Levins MD on 02/06/2009   Method used:   Print then Give to Patient   RxID:   [id number]    Immunization History:  Influenza Immunization History:    Influenza:  historical (01/21/2009)  Immunizations Administered:  Tetanus Vaccine:    Vaccine Type: Tdap    Site: left deltoid    Mfr: GlaxoSmithKline    Dose: 0.5 ml    Route: IM    Given by: Robin Ewing    Exp. Date: 10/20/2010    Lot #: [id number]    VIS given: 02/22/07 version given February 06, 2009.

## 2010-05-06 NOTE — Assessment & Plan Note (Signed)
Vital Signs:  Patient Profile:   54 Years Old Female Weight:      183 pounds Temp:     98.6 degrees F Pulse rate:   75 / minute BP sitting:   115 / 69  Vitals Entered By: Corwin Levins MD (January 10, 2007 5:36 PM)                 Chief Complaint:  URI symptoms.  History of Present Illness:  URI Symptoms      This is a 55 year old woman who presents with URI symptoms.  The symptoms began duration 1-2 days ago.  The severity is described as mild.  The patient reports nasal congestion, purulent nasal discharge, sore throat, and dry cough, but denies clear nasal discharge, productive cough, earache, and sick contacts.  Associated symptoms include low-grade fever (<100.5 degrees).  The patient denies dyspnea and wheezing.  The patient denies itchy throat.  Risk factors for Strep sinusitis include tooth pain.    Current Allergies (reviewed today): No known allergies  Updated/Current Medications (including changes made in today's visit):  LEVOTHROID 125 MCG TABS (LEVOTHYROXINE SODIUM) 1 by mouth qd LISINOPRIL 20 MG TABS (LISINOPRIL) 1 by mouth qd VIVELLE-DOT 0.0375 MG/24HR  PTTW (ESTRADIOL) once daily to skin   Past Medical History:    Reviewed history from 11/02/2006 and no changes required:       Hypothyroidism       Allergic rhinitis       GERD, mild       Hypertension       Hyperlipidemia       chronic neck pain/c-spine disc dz       Nephrolithiasis, hx of  Past Surgical History:    Reviewed history from 11/02/2006 and no changes required:       Hysterectomy- 1996       Tonsillectomy- 1961   Family History:    Reviewed history and no changes required:       Family History Diabetes 1st degree relative       Family History Hypertension  Social History:    Reviewed history and no changes required:       Married       Never Smoked       Alcohol use-no   Risk Factors:  Tobacco use:  never Alcohol use:  no    Physical Exam  General:  overweight-appearing.   Head:     Normocephalic and atraumatic without obvious abnormalities. No apparent alopecia or balding. Eyes:     No corneal or conjunctival inflammation noted. EOMI. Perrla. Funduscopic exam benign, without hemorrhages, exudates or papilledema. Vision grossly normal. Ears:     R TM erythema and L TM erythema.   Nose:     no external deformity.   Mouth:     pharyngeal erythema.   Neck:     cervical lymphadenopathy.   Lungs:     Normal respiratory effort, chest expands symmetrically. Lungs are clear to auscultation, no crackles or wheezes. Heart:     Normal rate and regular rhythm. S1 and S2 normal without gallop, murmur, click, rub or other extra sounds. Extremities:     No clubbing, cyanosis, edema, or deformity noted with normal full range of motion of all joints.      Impression & Recommendations:  Problem # 1:  SINUSITIS- ACUTE-NOS (ICD-461.9) recurrant problem, biaxin 500 two times a day for 10 days, also mucinex d otc prn  Problem # 2:  HYPERTENSION (ICD-401.9) Assessment: Improved  Her updated medication list for this problem includes:    Lisinopril 20 Mg Tabs (Lisinopril) .Marland Kitchen... 1 by mouth once daily  cont same meds   Complete Medication List: 1)  Levothroid 125 Mcg Tabs (Levothyroxine sodium) .Marland Kitchen.. 1 by mouth qd 2)  Lisinopril 20 Mg Tabs (Lisinopril) .Marland Kitchen.. 1 by mouth qd 3)  Vivelle-dot 0.0375 Mg/24hr Pttw (Estradiol) .... Once daily to skin   Patient Instructions: 1)  Please schedule a follow-up appointment in 3 months. 2)  Please schedule a follow-up appointment in 1 year.    ]

## 2010-05-06 NOTE — Progress Notes (Signed)
Summary: rx refills  Phone Note Call from Patient Call back at Work Phone [phone]   Caller: Patient Reason for Call: Refill Medication Summary of Call: Pt needs refills of Lisinopril and Levothyroxine to be sent to [facility] on [address] Initial call taken by: Brenton Grills CMA Duncan Dull),  January 31, 2010 1:19 PM  Follow-up for Phone Call        rx sent pt has appointment 04/03/2010 Follow-up by: Brenton Grills CMA Duncan Dull),  January 31, 2010 1:21 PM    Prescriptions: LISINOPRIL 20 MG TABS (LISINOPRIL) 1 by mouth once daily  #60 x 0   Entered by:   Brenton Grills CMA (AAMA)   Authorized by:   Corwin Levins MD   Signed by:   Brenton Grills CMA (AAMA) on 01/31/2010   Method used:   Electronically to        [facility] [address] (retail)       [phone]       [place], [place]  [address]       Ph: [phone]       Fax: [phone]   RxID:   [id number] LEVOTHYROXINE SODIUM 112 MCG  TABS (LEVOTHYROXINE SODIUM) 1 by mouth once daily  #60 x 0   Entered by:   Brenton Grills CMA (AAMA)   Authorized by:   Corwin Levins MD   Signed by:   Brenton Grills CMA (AAMA) on 01/31/2010   Method used:   Electronically to        [facility] [address] (retail)       [phone]       [place], [place]  [address]       Ph: [phone]       Fax: [phone]   RxID:   [id number]

## 2010-05-08 NOTE — Assessment & Plan Note (Signed)
Summary: CPX / [facility]  #   Vital Signs:  Patient profile:   55 year old female Height:      66.5 inches Weight:      213 pounds BMI:     33.99 O2 Sat:      99 % on Room air Temp:     98.8 degrees F oral Pulse rate:   88 / minute BP sitting:   132 / 82  (left arm) Cuff size:   large  Vitals Entered By: Zella Ball Ewing CMA Duncan Dull) (April 03, 2010 8:33 AM)  O2 Flow:  Room air  Preventive Care Screening  Pap Smear:    Date:  09/04/2009    Results:  normal   Mammogram:    Date:  09/04/2009    Results:  normal      had the flu shot already this season at work  CC: Adult Physical/RE   Primary Care Provider:  Corwin Levins MD  CC:  Adult Physical/RE.  History of Present Illness: here for wellness;  gave blood x 1 with hgb approx 13 in lat nov,  complicated by vagal episode with low BP one hr after gave the blood, EMS came , EKG ok, and did not go to ER.  Went back to work, felt some weakness, but resolved and no further recurrence since then.  No menses s/p TAH and no overt bleeding , bruising, and drinking lots of fluids in the last wks prio r to more recent hgb 11.9.   Pt denies CP, worsening sob, doe, wheezing, orthopnea, pnd, worsening LE edema, palps, dizziness or syncope  Pt denies new neuro symptoms such as headache, facial or extremity weakness  Pt denies polydipsia, polyuria  Overall good compliance with meds, trying to follow low chol diet, wt stable, little excercise however .  Overall good compliance with meds, and good tolerability.  No fever, wt loss, night sweats, loss of appetite or other constitutional symptoms Denies worsening depressive symptoms, suicidal ideation, or panic.   Pt states good ability with ADL's, low fall risk, home safety reviewed and adequate, no significant change in hearing or vision, trying to follow lower chol diet, and occasionally active only with regular excercise.  Does have incidental 3 days onset midl to mod facialy pain, pressure, lower grade  temp and greenish d/c.  Preventive Screening-Counseling & Management      Drug Use:  no.    Problems Prior to Update: 1)  Sinusitis- Acute-nos  (ICD-461.9) 2)  Hypersomnia  (ICD-780.54) 3)  Chronic Maxillary Sinusitis  (ICD-473.0) 4)  Thrush  (ICD-112.0) 5)  Plantar Fasciitis  (ICD-728.71) 6)  Ganglion Cyst, Wrist, Left  (ICD-727.41) 7)  Preventive Health Care  (ICD-V70.0) 8)  Headache  (ICD-784.0) 9)  Hypertension, Benign Essential  (ICD-401.1) 10)  Nephrolithiasis, Hx of  (ICD-V13.01) 11)  Family History Diabetes 1st Degree Relative  (ICD-V18.0) 12)  Cervical Radiculopathy  (ICD-723.4) 13)  Hyperlipidemia  (ICD-272.4) 14)  Hypertension  (ICD-401.9) 15)  Gerd  (ICD-530.81) 16)  Allergic Rhinitis  (ICD-477.9) 17)  Hypothyroidism  (ICD-244.9)  Medications Prior to Update: 1)  Levothyroxine Sodium 112 Mcg  Tabs (Levothyroxine Sodium) .Marland Kitchen.. 1 By Mouth Once Daily 2)  Lisinopril 20 Mg Tabs (Lisinopril) .Marland Kitchen.. 1 By Mouth Once Daily 3)  Vivelle-Dot 0.0375 Mg/24hr  Pttw (Estradiol) .... Once Daily To Skin 4)  Hydrocodone-Acetaminophen 5-500 Mg Tabs (Hydrocodone-Acetaminophen) .... Take 1 Tablet By Mouth Two Times A Day As Needed 5)  Allegra-D 12 Hour 60-120 Mg  Tb12 (  Fexofenadine-Pseudoephedrine) .Marland Kitchen.. 1 By Mouth Two Times A Day Prn 6)  Fluticasone Propionate 50 Mcg/act  Susp (Fluticasone Propionate) .... 2 Spray/side Once Daily 7)  Hyoscyamine Sulfate 0.125 Mg Tbdp (Hyoscyamine Sulfate) .... Dissolve 2 Tablets Under The Tounge Every 4 Hours If Needed  Current Medications (verified): 1)  Levothyroxine Sodium 112 Mcg  Tabs (Levothyroxine Sodium) .Marland Kitchen.. 1 By Mouth Once Daily 2)  Lisinopril 20 Mg Tabs (Lisinopril) .Marland Kitchen.. 1 By Mouth Once Daily 3)  Vivelle-Dot 0.0375 Mg/24hr  Pttw (Estradiol) .... Once Daily To Skin 4)  Hydrocodone-Acetaminophen 5-500 Mg Tabs (Hydrocodone-Acetaminophen) .... Take 1 Tablet By Mouth Two Times A Day As Needed 5)  Fluticasone Propionate 50 Mcg/act  Susp (Fluticasone  Propionate) .... 2 Spray/side Once Daily 6)  Hyoscyamine Sulfate 0.125 Mg Tbdp (Hyoscyamine Sulfate) .... Dissolve 2 Tablets Under The Tounge Every 4 Hours If Needed 7)  Levofloxacin 500 Mg Tabs (Levofloxacin) .Marland Kitchen.. 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  Drug use-no  Risk Factors: Smoking Status: never (01/10/2007)  Past Surgical History: Hysterectomy- 1996 Tonsillectomy- 1961 s/p selective nerve root block 2002 per Dr Otelia Sergeant to right neck  Social History: Married Never Smoked 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         all otherwise negative per pt -    Physical Exam  General:  alert and overweight-appearing.   Head:  normocephalic and atraumatic.   Eyes:  vision grossly intact, pupils equal, and pupils round.   Ears:  R ear normal and L ear normal.  , sinus tender bilat  Nose:  nasal dischargemucosal pallor and mucosal  erythema.   Mouth:  pharyngeal erythema and fair dentition.   Neck:  supple and no masses.   Lungs:  normal respiratory effort, no intercostal retractions or use of accessory muscles; normal breath sounds bilaterally - no crackles and no wheezes.    Heart:  normal rate, regular rhythm, no murmur, and no rub. BLE without edema Abdomen:  soft, non-tender, and normal bowel sounds.   Msk:  no joint tenderness and no joint swelling.   Extremities:  no edema, no erythema  Neurologic:  cranial nerves II-XII intact and strength normal in all extremities.   Skin:  Intact without suspicious lesions or rashes Psych:  not depressed appearing and slightly anxious.     Impression & Recommendations:  Problem # 1:  Preventive Health Care (ICD-V70.0) Overall doing well, age appropriate education and counseling updated, referral for preventive services and immunizations addressed, dietary counseling and smoking status adressed , most recent labs reviewed, ecg reviewed I have personally reviewed and have noted 1.The patient's medical and social history 2.Their use of alcohol, tobacco or illicit drugs 3.Their current medications and supplements  4. Functional ability including ADL's, fall risk, home safety risk, hearing & visual impairment  5.Diet and physical activities 6.Evidence for depression or mood disorders The patients weight, height, BMI  have been recorded in the chart I have made referrals, counseling and provided education to the patient based review of the above  Orders: EKG w/ Interpretation (93000)  Problem # 2:  SINUSITIS- ACUTE-NOS (ICD-461.9)  The following medications were removed from the medication list:    Allegra-d 12 Hour 60-120 Mg Tb12 (Fexofenadine-pseudoephedrine) .Marland Kitchen... 1 by mouth two times a day prn Her updated medication list for this problem includes:    Fluticasone Propionate 50 Mcg/act Susp (Fluticasone propionate) .Marland Kitchen... 2 spray/side once daily    Levofloxacin 500 Mg  Tabs (Levofloxacin) .Marland Kitchen... 1 by mouth once daily treat as above, f/u any worsening signs or symptoms   Problem # 3:  HYPERTENSION, BENIGN ESSENTIAL (ICD-401.1)  Her updated medication list for this problem includes:    Lisinopril 20 Mg Tabs (Lisinopril) .Marland Kitchen... 1 by mouth once daily  BP today: 132/82 Prior BP: 114/82 (08/23/2009)  Labs Reviewed: K+: 4.0 (03/27/2010) Creat: : 0.7 (03/27/2010)   Chol: 174 (03/27/2010)   HDL: 44.80 (03/27/2010)   LDL: 97 (03/27/2010)   TG: 162.0 (03/27/2010) stable overall by hx and exam, ok to continue meds/tx as is   Problem # 4:  HYPERLIPIDEMIA (ICD-272.4)  much improved in the past yr with better diet and normalization of the thyroid  Labs Reviewed: SGOT: 16 (03/27/2010)   SGPT: 17 (03/27/2010)   HDL:44.80 (03/27/2010), 53.80 (02/01/2009)  LDL:97 (03/27/2010), 107 (16/01/9603)  Chol:174 (03/27/2010), 234 (02/01/2009)  Trig:162.0 (03/27/2010), 211.0 (02/01/2009)  Problem # 5:  HYPOTHYROIDISM (ICD-244.9)  Her updated medication list for this problem includes:    Levothyroxine Sodium 112 Mcg Tabs (Levothyroxine sodium) .Marland Kitchen... 1 by mouth once daily  Labs Reviewed: TSH: 2.98 (03/27/2010)    Chol: 174 (03/27/2010)   HDL: 44.80 (03/27/2010)   LDL: 97 (03/27/2010)   TG: 162.0 (03/27/2010) much improved,. Continue all previous medications as before this visit   Complete Medication List: 1)  Levothyroxine Sodium 112 Mcg Tabs (Levothyroxine sodium) .Marland Kitchen.. 1 by mouth once daily 2)  Lisinopril 20 Mg Tabs (Lisinopril) .Marland Kitchen.. 1 by mouth once daily 3)  Vivelle-dot 0.0375 Mg/24hr Pttw (Estradiol) .... Once daily to skin 4)  Hydrocodone-acetaminophen 5-500 Mg Tabs (Hydrocodone-acetaminophen) .... Take 1 tablet by mouth two times a day as needed 5)  Fluticasone Propionate 50 Mcg/act Susp (Fluticasone propionate) .... 2 spray/side once daily 6)  Hyoscyamine Sulfate 0.125 Mg Tbdp (Hyoscyamine sulfate) .... Dissolve 2 tablets under the tounge every 4 hours if needed 7)   Levofloxacin 500 Mg Tabs (Levofloxacin) .Marland Kitchen.. 1 by mouth once daily  Patient Instructions: 1)  Please take all new medications as prescribed  2)  Continue all previous medications as before this visit  3)  Please schedule a follow-up appointment in 1 year, or sooner if needed Prescriptions: HYOSCYAMINE SULFATE 0.125 MG TBDP (HYOSCYAMINE SULFATE) DISSOLVE 2 TABLETS UNDER THE TOUNGE EVERY 4 HOURS IF NEEDED  #60 x 5   Entered and Authorized by:   Corwin Levins MD   Signed by:   Corwin Levins MD on 04/03/2010   Method used:   Print then Give to Patient   RxID:   [id number] FLUTICASONE PROPIONATE 50 MCG/ACT  SUSP (FLUTICASONE PROPIONATE) 2 spray/side once daily  #3 x 3   Entered and Authorized by:   Corwin Levins MD   Signed by:   Fayrene Fearing  Ellin Mayhew MD on 04/03/2010   Method used:   Print then Give to Patient   RxID:   [id number] HYDROCODONE-ACETAMINOPHEN 5-500 MG TABS (HYDROCODONE-ACETAMINOPHEN) Take 1 tablet by mouth two times a day as needed  #60 x 2   Entered and Authorized by:   Corwin Levins MD   Signed by:   Corwin Levins MD on 04/03/2010   Method used:   Print then Give to Patient   RxID:   [id number] LISINOPRIL 20 MG TABS (LISINOPRIL) 1 by mouth once daily  #90 x 3   Entered and Authorized by:   Corwin Levins MD   Signed by:   Corwin Levins MD on 04/03/2010   Method used:   Print then Give to Patient   RxID:   [id number] LEVOTHYROXINE SODIUM 112 MCG  TABS (LEVOTHYROXINE SODIUM) 1 by mouth once daily  #90 x 3   Entered and Authorized by:   Corwin Levins MD   Signed by:   Corwin Levins MD on 04/03/2010   Method used:   Print then Give to Patient   RxID:   [id number] LEVOFLOXACIN 500 MG TABS (LEVOFLOXACIN) 1 by mouth once daily  #10 x 0   Entered and Authorized by:   Corwin Levins MD   Signed by:   Corwin Levins MD on 04/03/2010   Method used:   Print then Give to Patient   RxID:   [id number]    Orders Added: 1)  EKG w/ Interpretation [93000]

## 2010-05-16 ENCOUNTER — Encounter: Payer: Self-pay | Admitting: Internal Medicine

## 2010-05-16 ENCOUNTER — Ambulatory Visit (INDEPENDENT_AMBULATORY_CARE_PROVIDER_SITE_OTHER): Payer: Managed Care, Other (non HMO) | Admitting: Internal Medicine

## 2010-05-16 DIAGNOSIS — I10 Essential (primary) hypertension: Secondary | ICD-10-CM

## 2010-05-16 DIAGNOSIS — J019 Acute sinusitis, unspecified: Secondary | ICD-10-CM

## 2010-05-16 DIAGNOSIS — K219 Gastro-esophageal reflux disease without esophagitis: Secondary | ICD-10-CM

## 2010-05-16 DIAGNOSIS — J309 Allergic rhinitis, unspecified: Secondary | ICD-10-CM

## 2010-06-03 NOTE — Assessment & Plan Note (Signed)
Summary: sinus/#   Vital Signs:  Patient profile:   55 year old female Height:      66.5 inches Weight:      212.25 pounds BMI:     33.87 O2 Sat:      98 % on Room air Temp:     98.9 degrees F oral Pulse rate:   86 / minute BP sitting:   130 / 80  (left arm) Cuff size:   large  Vitals Entered By: Zella Ball Ewing CMA Duncan Dull) (May 16, 2010 8:28 AM)  O2 Flow:  Room air CC: Sinus Congestion/RE   Primary Care Provider:  Corwin Levins MD  CC:  Sinus Congestion/RE.  History of Present Illness: here for f/u with acute onset 3 days fever, sinus pain, pressure, and greenish d/c. with mild ST, but no cough and Pt denies CP, worsening sob, doe, wheezing, orthopnea, pnd, worsening LE edema, palps, dizziness or syncope    All on top of nasal allergy clearish congsetion ongoing but controlled as long as she is complaint with her nasal steroid.  Pt denies new neuro symptoms such as headache, facial or extremity weakness  Pt denies polydipsia, polyuria   Overall good compliance with meds, trying to follow low chol diet, wt stable, little excercise however   No recent wt loss, night sweats, loss of appetite or other constitutional symptoms  .  Has very occasioaly reflux, OK with diet and OTC meds, no dysphagia, n/v, abd pain, bowel change or blood.    Problems Prior to Update: 1)  Sinusitis- Acute-nos  (ICD-461.9) 2)  Hypersomnia  (ICD-780.54) 3)  Chronic Maxillary Sinusitis  (ICD-473.0) 4)  Thrush  (ICD-112.0) 5)  Plantar Fasciitis  (ICD-728.71) 6)  Ganglion Cyst, Wrist, Left  (ICD-727.41) 7)  Preventive Health Care  (ICD-V70.0) 8)  Headache  (ICD-784.0) 9)  Hypertension, Benign Essential  (ICD-401.1) 10)  Nephrolithiasis, Hx of  (ICD-V13.01) 11)  Family History Diabetes 1st Degree Relative  (ICD-V18.0) 12)  Cervical Radiculopathy  (ICD-723.4) 13)  Hyperlipidemia  (ICD-272.4) 14)  Hypertension  (ICD-401.9) 15)  Gerd  (ICD-530.81) 16)  Allergic Rhinitis  (ICD-477.9) 17)  Hypothyroidism   (ICD-244.9)  Medications Prior to Update: 1)  Levothyroxine Sodium 112 Mcg  Tabs (Levothyroxine Sodium) .Marland Kitchen.. 1 By Mouth Once Daily 2)  Lisinopril 20 Mg Tabs (Lisinopril) .Marland Kitchen.. 1 By Mouth Once Daily 3)  Vivelle-Dot 0.0375 Mg/24hr  Pttw (Estradiol) .... Once Daily To Skin 4)  Hydrocodone-Acetaminophen 5-500 Mg Tabs (Hydrocodone-Acetaminophen) .... Take 1 Tablet By Mouth Two Times A Day As Needed 5)  Fluticasone Propionate 50 Mcg/act  Susp (Fluticasone Propionate) .... 2 Spray/side Once Daily 6)  Hyoscyamine Sulfate 0.125 Mg Tbdp (Hyoscyamine Sulfate) .... Dissolve 2 Tablets Under The Tounge Every 4 Hours If Needed 7)  Levofloxacin 500 Mg Tabs (Levofloxacin) .Marland Kitchen.. 1 By Mouth Once Daily  Current Medications (verified): 1)  Levothyroxine Sodium 112 Mcg  Tabs (Levothyroxine Sodium) .Marland Kitchen.. 1 By Mouth Once Daily 2)  Lisinopril 20 Mg Tabs (Lisinopril) .Marland Kitchen.. 1 By Mouth Once Daily 3)  Vivelle-Dot 0.0375 Mg/24hr  Pttw (Estradiol) .... Once Daily To Skin 4)  Hydrocodone-Acetaminophen 5-500 Mg Tabs (Hydrocodone-Acetaminophen) .... Take 1 Tablet By Mouth Two Times A Day As Needed 5)  Fluticasone Propionate 50 Mcg/act  Susp (Fluticasone Propionate) .... 2 Spray/side Once Daily 6)  Hyoscyamine Sulfate 0.125 Mg Tbdp (Hyoscyamine Sulfate) .... Dissolve 2 Tablets Under The Tounge Every 4 Hours If Needed 7)  Clarithromycin 500 Mg Tabs (Clarithromycin) .Marland Kitchen.. 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 , mild ill  Head:  normocephalic and atraumatic.   Eyes:  vision grossly intact, pupils equal, and pupils round.   Ears:  R ear normal and L ear normal.  , sinus tender bilat  Nose:  nasal dischargemucosal pallor and mucosal erythema.   Mouth:  pharyngeal erythema and fair dentition.   Neck:  supple and no masses.   Lungs:  normal respiratory effort, no intercostal retractions or use of accessory muscles; normal breath sounds bilaterally - no crackles and no wheezes.    Heart:  normal rate, regular rhythm, no murmur, and no rub. BLE without edema Abdomen:  soft, non-tender, and normal bowel sounds.   Extremities:  no edema, no erythema    Impression & Recommendations:  Problem # 1:  SINUSITIS- ACUTE-NOS (ICD-461.9)  Her updated medication list for this problem includes:    Fluticasone Propionate 50 Mcg/act Susp (Fluticasone propionate) .Marland Kitchen... 2 spray/side once daily    Clarithromycin 500 Mg Tabs (Clarithromycin) .Marland Kitchen... 1 by mouth two times a day treat as above, f/u any worsening signs or symptoms   Instructed on treatment. Call if symptoms persist or worsen.   Problem # 2:  HYPERTENSION, BENIGN ESSENTIAL (ICD-401.1)  Her updated medication list for this problem includes:    Lisinopril 20 Mg Tabs (Lisinopril) .Marland Kitchen... 1 by mouth once daily  BP today: 130/80 Prior BP: 132/82 (04/03/2010)  Labs Reviewed: K+: 4.0 (03/27/2010) Creat: : 0.7 (03/27/2010)   Chol: 174 (03/27/2010)   HDL: 44.80 (03/27/2010)   LDL: 97 (03/27/2010)   TG: 162.0 (03/27/2010) stable overall by hx and exam, ok to continue meds/tx as is   Problem # 3:  ALLERGIC RHINITIS (ICD-477.9)  Her updated medication list for this problem includes:    Fluticasone Propionate 50 Mcg/act Susp (Fluticasone  propionate) .Marland Kitchen... 2 spray/side once daily  Discussed use of allergy medications and environmental measures.  stable overall by hx and exam, ok to continue meds/tx as is   Problem # 4:  GERD (ICD-530.81)  Her updated medication list for this problem includes:    Hyoscyamine Sulfate 0.125 Mg Tbdp (Hyoscyamine sulfate) .Marland Kitchen... Dissolve 2 tablets under the tounge every 4 hours if needed  Labs Reviewed: Hgb: 11.9 (03/27/2010)   Hct: 34.9 (03/27/2010) stable overall by hx and exam, ok to continue meds/tx as is - declines further tx such as   Complete Medication List: 1)  Levothyroxine Sodium 112 Mcg Tabs (Levothyroxine sodium) .Marland Kitchen.. 1 by mouth once daily 2)  Lisinopril 20 Mg Tabs (Lisinopril) .Marland Kitchen.. 1 by mouth once daily 3)  Vivelle-dot 0.0375 Mg/24hr Pttw (Estradiol) .... Once daily to skin 4)  Hydrocodone-acetaminophen 5-500 Mg Tabs (Hydrocodone-acetaminophen) .... Take 1 tablet by mouth two times a day as needed 5)  Fluticasone Propionate 50 Mcg/act Susp (Fluticasone propionate) .... 2 spray/side once daily 6)  Hyoscyamine  Sulfate 0.125 Mg Tbdp (Hyoscyamine sulfate) .... Dissolve 2 tablets under the tounge every 4 hours if needed 7)  Clarithromycin 500 Mg Tabs (Clarithromycin) .Marland Kitchen.. 1 by mouth two times a day  Patient Instructions: 1)  Please take all new medications as prescribed 2)  Continue all previous medications as before this visit  3)  You can also use Mucinex OTC or it's generic for congestion  4)  Please schedule a follow-up appointment as needed. Prescriptions: CLARITHROMYCIN 500 MG TABS (CLARITHROMYCIN) 1 by mouth two times a day  #28 x 0   Entered and Authorized by:   Corwin Levins MD   Signed by:   Corwin Levins MD on 05/16/2010   Method used:   Print then Give to Patient   RxID:   [id number]    Orders Added: 1)  Est. Patient Level IV [27253]

## 2010-07-11 ENCOUNTER — Ambulatory Visit (INDEPENDENT_AMBULATORY_CARE_PROVIDER_SITE_OTHER): Payer: Managed Care, Other (non HMO) | Admitting: Family Medicine

## 2010-07-11 ENCOUNTER — Encounter: Payer: Self-pay | Admitting: Family Medicine

## 2010-07-11 VITALS — BP 142/100 | HR 93 | Temp 97.9°F | Ht 66.5 in | Wt 210.1 lb

## 2010-07-11 DIAGNOSIS — I10 Essential (primary) hypertension: Secondary | ICD-10-CM

## 2010-07-11 DIAGNOSIS — J019 Acute sinusitis, unspecified: Secondary | ICD-10-CM

## 2010-07-11 MED ORDER — CHLORPHENIRAMINE-HYDROCODONE 8-10 MG/5ML PO LQCR: 5.0000 mL | Freq: Two times a day (BID) | ORAL | Status: DC | PRN

## 2010-07-11 MED ORDER — CEFDINIR 300 MG PO CAPS: 300.0000 mg | ORAL_CAPSULE | Freq: Two times a day (BID) | ORAL | Status: AC

## 2010-07-11 NOTE — Patient Instructions (Signed)
Sinusitis Sinuses are air pockets within the bones of your face. The growth of bacteria within a sinus leads to infection. Infection keeps the sinuses from draining. This infection is called sinusitis. SYMPTOMS There will be different areas of pain depending on which sinuses have become infected.  The maxillary sinuses often produce pain beneath the eyes.   Frontal sinusitis may cause pain in the middle of the forehead and above the eyes.  Other problems (symptoms) include:  Toothaches.   Colored, pus-like (purulent) drainage from the nose.   Any swelling, warmth, or tenderness over the sinus areas may be signs of infection.  TREATMENT Sinusitis is most often determined by an exam and you may have x-rays taken. If x-rays have been taken, make sure you obtain your results. Or find out how you are to obtain them. Your caregiver may give you medications (antibiotics). These are medications that will help kill the infection. You may also be given a medication (decongestant) that helps to reduce sinus swelling.  HOME CARE INSTRUCTIONS  Only take over-the-counter or prescription medicines for pain, discomfort, or fever as directed by your caregiver.   Drink extra fluids. Fluids help thin the mucus so your sinuses can drain more easily.   Applying either moist heat or ice packs to the sinus areas may help relieve discomfort.   Use saline nasal sprays to help moisten your sinuses. The sprays can be found at your local drugstore.  SEEK IMMEDIATE MEDICAL CARE IF YOU DEVELOP:  High fever that is still present after two days of antibiotic treatment.   Increasing pain, severe headaches, or toothache.   Nausea, vomiting, or drowsiness.   Unusual swelling around the face or trouble seeing.  MAKE SURE YOU:   Understand these instructions.   Will watch your condition.   Will get help right away if you are not doing well or get worse.  Document Released: 03/23/2005 Document Re-Released:  03/05/2008 [facility] Patient Information 2011 [place], [place].  Only plain Mucinex 2 x daily and a probiotic such as yogurt or Align caps daily while on antibioticss

## 2010-07-12 ENCOUNTER — Encounter: Payer: Self-pay | Admitting: Family Medicine

## 2010-07-12 NOTE — Progress Notes (Signed)
[name] [id number] 10/09/1955 07/12/2010      Progress Note-Follow Up  Subjective  Chief Complaint  Chief Complaint  Patient presents with  . Otitis Media    both ears X 1 week  . Sore Throat    X 1 week  . Nasal Congestion    head congestion X 1 week    HPI  Patient in today c/o 5-6 day h/o malaise, fatigue, myalgias, anorexia, fever, chills, nausea. Roughly 4 days ago she developed a worsening cough, chest tightness, popping in b/l ears, sore throat and nasal congestion. She also notes some clear rhinorrhea and the cough is bad enough to keep her p at night. She has tried some OTC meds but these have not quieted her cough or helped her to sleep. She notes some facial pressure but no severe HA  Past Medical History  Diagnosis Date  . SINUSITIS- ACUTE-NOS 04/03/2010  . HYPERTENSION 01/10/2007    No past surgical history on file.  No family history on file.  History   Social History  . Marital Status: Married    Spouse Name: N/A    Number of Children: N/A  . Years of Education: N/A   Occupational History  . Not on file.   Social History Main Topics  . Smoking status: Never Smoker   . Smokeless tobacco: Never Used  . Alcohol Use: No  . Drug Use: No  . Sexually Active: Yes -- Female partner(s)   Other Topics Concern  . Not on file   Social History Narrative  . No narrative on file    No current outpatient prescriptions on file prior to visit.    No Known Allergies  Review of Systems  Review of Systems  Constitutional: Positive for fever, chills and malaise/fatigue.  HENT: Positive for ear pain, congestion and sore throat. Negative for nosebleeds and ear discharge.   Eyes: Negative for discharge.  Respiratory: Positive for cough and sputum production. Negative for shortness of breath and wheezing.   Cardiovascular: Negative for chest pain, palpitations and leg swelling.  Gastrointestinal: Negative for nausea, abdominal pain, diarrhea and  constipation.  Genitourinary: Negative for dysuria.  Musculoskeletal: Positive for myalgias and back pain. Negative for joint pain and falls.  Skin: Negative for rash.  Neurological: Negative for loss of consciousness and headaches.  Endo/Heme/Allergies: Negative for polydipsia.  Psychiatric/Behavioral: Negative for depression and suicidal ideas. The patient is not nervous/anxious and does not have insomnia.     Objective  BP 142/100  Pulse 93  Temp(Src) 97.9 F (36.6 C) (Oral)  Ht 5' 6.5" (1.689 m)  Wt 210 lb 1.9 oz (95.31 kg)  BMI 33.41 kg/m2  SpO2 97%  Physical Exam  Physical Exam  Constitutional: She is oriented to person, place, and time and well-developed, well-nourished, and in no distress. No distress.  HENT:  Head: Normocephalic and atraumatic.  Mouth/Throat: No oropharyngeal exudate.       B/l TMs dull and retracted, oropharynx erythematous but not edematous  Eyes: Conjunctivae are normal.  Neck: Neck supple. No thyromegaly present.  Cardiovascular: Normal rate, regular rhythm and normal heart sounds.   No murmur heard. Pulmonary/Chest: Effort normal. She has no wheezes.       RLL rhonchi  Abdominal: She exhibits no distension and no mass. There is no tenderness.  Musculoskeletal: She exhibits no edema.  Lymphadenopathy:    She has no cervical adenopathy.  Neurological: She is alert and oriented to person, place, and time.  Skin: Skin is  warm and dry. No rash noted. She is not diaphoretic.  Psychiatric: Memory, affect and judgment normal.    Lab Results  Component Value Date   TSH 2.98 03/27/2010   Lab Results  Component Value Date   WBC 5.6 03/27/2010   HGB 11.9* 03/27/2010   HCT 34.9* 03/27/2010   MCV 90.3 03/27/2010   PLT 208.0 03/27/2010   Lab Results  Component Value Date   CREATININE 0.7 03/27/2010   BUN 12 03/27/2010   NA 138 03/27/2010   K 4.0 03/27/2010   CL 105 03/27/2010   CO2 26 03/27/2010   Lab Results  Component Value Date    ALT 17 03/27/2010   AST 16 03/27/2010   ALKPHOS 70 03/27/2010   BILITOT 0.5 03/27/2010   Lab Results  Component Value Date   CHOL 174 03/27/2010   Lab Results  Component Value Date   HDL 44.80 03/27/2010   Lab Results  Component Value Date   LDLCALC 97 03/27/2010   Lab Results  Component Value Date   TRIG 162.0* 03/27/2010   Lab Results  Component Value Date   CHOLHDL 4 03/27/2010     Assessment & Plan  SINUSITIS- ACUTE-NOS Patient with recurrence of sinus symptoms, now with chest congestion and sore throat as well, will start Cefdinir and Tussionex for cough at night. Use Mucinex bid and increase clear fluids and rest.  HYPERTENSION Mild elevation today with acute illness. Warned not to take any Sudafed or Dextramethorphan based products but will not change any daily medications at this time

## 2010-07-12 NOTE — Assessment & Plan Note (Signed)
Mild elevation today with acute illness. Warned not to take any Sudafed or Dextramethorphan based products but will not change any daily medications at this time

## 2010-07-12 NOTE — Assessment & Plan Note (Signed)
Patient with recurrence of sinus symptoms, now with chest congestion and sore throat as well, will start Cefdinir and Tussionex for cough at night. Use Mucinex bid and increase clear fluids and rest.

## 2010-07-29 ENCOUNTER — Ambulatory Visit (INDEPENDENT_AMBULATORY_CARE_PROVIDER_SITE_OTHER): Payer: Managed Care, Other (non HMO) | Admitting: Internal Medicine

## 2010-07-29 ENCOUNTER — Ambulatory Visit (INDEPENDENT_AMBULATORY_CARE_PROVIDER_SITE_OTHER)
Admission: RE | Admit: 2010-07-29 | Discharge: 2010-07-29 | Disposition: A | Discharge status: Home / Self Care | Payer: Managed Care, Other (non HMO) | Source: Ambulatory Visit | Attending: Internal Medicine | Admitting: Internal Medicine

## 2010-07-29 ENCOUNTER — Encounter: Payer: Self-pay | Admitting: Internal Medicine

## 2010-07-29 VITALS — BP 112/82 | HR 81 | Temp 98.3°F | Ht 66.0 in | Wt 211.4 lb

## 2010-07-29 DIAGNOSIS — R062 Wheezing: Secondary | ICD-10-CM

## 2010-07-29 DIAGNOSIS — Z0001 Encounter for general adult medical examination with abnormal findings: Secondary | ICD-10-CM | POA: Insufficient documentation

## 2010-07-29 DIAGNOSIS — J209 Acute bronchitis, unspecified: Secondary | ICD-10-CM

## 2010-07-29 DIAGNOSIS — I10 Essential (primary) hypertension: Secondary | ICD-10-CM

## 2010-07-29 DIAGNOSIS — Z0000 Encounter for general adult medical examination without abnormal findings: Secondary | ICD-10-CM

## 2010-07-29 MED ORDER — HYDROCODONE-HOMATROPINE 5-1.5 MG/5ML PO SYRP: 5.0000 mL | ORAL_SOLUTION | Freq: Four times a day (QID) | ORAL | Status: AC | PRN

## 2010-07-29 MED ORDER — METHYLPREDNISOLONE ACETATE 80 MG/ML IJ SUSP
120.0000 mg | Freq: Once | INTRAMUSCULAR | Status: AC
  Administered 2010-07-29: 120 mg via INTRAMUSCULAR

## 2010-07-29 MED ORDER — PREDNISONE 10 MG PO TABS: 10.0000 mg | ORAL_TABLET | Freq: Every day | ORAL | Status: AC

## 2010-07-29 MED ORDER — AZITHROMYCIN 250 MG PO TABS: ORAL_TABLET | ORAL | Status: AC

## 2010-07-29 NOTE — Progress Notes (Signed)
  Subjective:    Patient ID: [name], female    DOB: 01-09-1956, 55 y.o.   MRN: [id number]  HPI Here with acute onset mild to mod 2-3 days ST, HA, general weakness and malaise, with prod cough greenish sputum, but Pt denies chest pain, increased sob or doe,  orthopnea, PND, increased LE swelling, palpitations, dizziness or syncope, but has mild wheezing onset since last PM and a sense of chest tightness.  Pt denies new neurological symptoms such as new headache, or facial or extremity weakness or numbness   Pt denies polydipsia, polyuria.   Pt denies fever, wt loss, night sweats, loss of appetite, or other constitutional symptoms, except with acute symptoms. Past Medical History  Diagnosis Date  . SINUSITIS- ACUTE-NOS 04/03/2010  . HYPERTENSION 01/10/2007   No past surgical history on file.  reports that she has never smoked. She has never used smokeless tobacco. She reports that she does not drink alcohol or use illicit drugs. family history is not on file. No Known Allergies Current Outpatient Prescriptions on File Prior to Visit  Medication Sig Dispense Refill  . levothyroxine (SYNTHROID, LEVOTHROID) 125 MCG tablet Take 125 mcg by mouth daily.        Marland Kitchen lisinopril (PRINIVIL,ZESTRIL) 20 MG tablet Take 20 mg by mouth daily.        . chlorpheniramine-hydrocodone (TUSSIONEX) 8-10 MG/5ML suspension Take 5 mLs by mouth every 12 (twelve) hours as needed for cough.  60 mL  0   No current facility-administered medications on file prior to visit.   Review of Systems All otherwise neg per pt     Objective:   Physical Exam BP 112/82  Pulse 81  Temp(Src) 98.3 F (36.8 C) (Oral)  Ht 5\' 6"  (1.676 m)  Wt 211 lb 6 oz (95.879 kg)  BMI 34.12 kg/m2  SpO2 99% Physical Exam  VS noted Constitutional: Pt appears well-developed and well-nourished.  HENT: Head: Normocephalic. Mild ill Right Ear: External ear normal. bilat tm's mild erythema, sinus nontender, pharynx midl erythema Left Ear:  External ear normal.  Eyes: Conjunctivae and EOM are normal. Pupils are equal, round, and reactive to light.  Neck: Normal range of motion. Neck supple.  Cardiovascular: Normal rate and regular rhythm.   Pulmonary/Chest: Effort normal and breath sounds mild decreased bilat with mild wheezing Abd:  Soft, NT, non-distended, + BS Neurological: Pt is alert. No cranial nerve deficit.  Skin: Skin is warm. No erythema.  Psychiatric: Pt behavior is normal. Thought content normal.         Assessment & Plan:

## 2010-07-29 NOTE — Assessment & Plan Note (Signed)
stable overall by hx and exam, most recent lab reviewed with pt, and pt to continue medical treatment as before  BP Readings from Last 3 Encounters:  07/29/10 112/82  07/11/10 142/100  05/16/10 130/80

## 2010-07-29 NOTE — Patient Instructions (Signed)
You had the steroid shot today Take all new medications as prescribed Continue all other medications as before Please go to XRAY in the Basement for the x-ray test Please call the number on the [facility] (the [employer] System) for results of testing in 2-3 days

## 2010-07-29 NOTE — Progress Notes (Signed)
Quick Note:  Voice message left on [employer] system - lab is negative, normal or otherwise stable, pt to continue same tx ______ 

## 2010-07-29 NOTE — Assessment & Plan Note (Addendum)
Mild to mod, for antibx course,  to f/u any worsening symptoms or concerns, cant ro pna - for cxr 

## 2010-07-29 NOTE — Assessment & Plan Note (Signed)
Mild, for depomedrol IM today, and predpack for home, cough med prn use,  to f/u any worsening symptoms or concerns

## 2010-08-22 NOTE — Consult Note (Signed)
NAME:  [name], [name]                           ACCOUNT NO.:  [id number]   MEDICAL RECORD NO.:  [id number]                   PATIENT TYPE:  EMS   LOCATION:  ED                                   FACILITY:  [facility]   PHYSICIAN:  Ollen Gross. Vernell Morgans, M.D.              DATE OF BIRTH:  05/26/1955   DATE OF CONSULTATION:  06/03/2002  DATE OF DISCHARGE:                                   CONSULTATION   The patient is a 55 year old white female who has a history of small  abscesses in different skin folds of her body.  Over the last couple of  weeks, she has had some increasing pain and swelling in her left axilla.  She denies any fevers at home.  The pain does not radiate.  She has  developed two small swollen areas, one of which has drained a very slight  bit over the last day and has gotten a little bit smaller.  The other is  pretty much unchanged.  Her chief complaint is a boil under the arm.   REVIEW OF SYSTEMS:  She denies any nausea, vomiting, fever, chills, chest  pain, shortness of breath, diarrhea, or dysuria.  The rest of her review of  systems is unremarkable.   PAST MEDICAL HISTORY:  Significant for multiple abscesses, most of which of  which resolve spontaneously.   PAST SURGICAL HISTORY:  Significant for:  1. Drainage of a groin abscess.  2. Hysterectomy.   ALLERGIES:  No known drug allergies.   MEDICATIONS:  She takes no regular medicines at home.   SOCIAL HISTORY:  She denies the use of alcohol or tobacco products.   FAMILY HISTORY:  Noncontributory.   PHYSICAL EXAMINATION:  VITAL SIGNS:  Temperature 98.7 degrees, blood  pressure 145/75, pulse 83.  GENERAL APPEARANCE:  She is a well-developed, well-nourished, white female  in no acute distress.  SKIN:  Warm and dry with no jaundice.  HEENT:  Extraocular movements are intact.  Pupils equal, round, and reactive  to light.  LUNGS:  Clear bilaterally.  HEART:  Regular rate and rhythm.  ABDOMEN:  Soft and nontender.  EXTREMITIES:  No cyanosis, clubbing, or edema.  On examination of her left  axilla, she has two swollen areas.  The superior one is very fluctuant at  about 2 cm in diameter.  The inferior one is also about 2 cm in diameter,  firm, and indurated.   ASSESSMENT AND PLAN:  This is a 55 year old white female with a history of  subcutaneous abscesses.  She presents now with abscess in her left axilla.  I suspect the other swollen area is potentially a lymph node that is reacted  or a distant area of induration in response to the abscess.  The axilla was  prepped with Betadine and draped in the usual sterile manner and the areas  were infiltrated with 1% lidocaine.  A small incision was made over the  superior fluctuant area and a large amount of purulent material was able to  be evacuated.  The wound was then cleaned and then packed with gauze.  Pressure was held until the wound was hemostatic.  The wound was then  repacked and sterile dressings were applied.  The second mass was aspirated  with an 18 gauge needle and no fluid could be obtained.  Also consistent  with induration as a response to the inflammatory abscess.  She tolerated  the procedure well and is in stable condition without any complaints.  We  will plan to discharge her from the emergency department with follow-up on  Monday to the clinic for a dressing change and wound check.  She agrees to  call right away if she has any other problems.  Otherwise we will see her  then.                                               Ollen Gross. Vernell Morgans, M.D.    PST/MEDQ  D:  06/03/2002  T:  06/03/2002  Job:  [id number]

## 2010-11-19 ENCOUNTER — Other Ambulatory Visit: Payer: Self-pay | Admitting: Internal Medicine

## 2010-11-19 NOTE — Telephone Encounter (Signed)
Please advise 

## 2010-11-19 NOTE — Telephone Encounter (Signed)
Rx faxed to [facility]. 

## 2011-02-11 ENCOUNTER — Telehealth: Payer: Self-pay

## 2011-02-11 DIAGNOSIS — Z0000 Encounter for general adult medical examination without abnormal findings: Secondary | ICD-10-CM

## 2011-02-11 NOTE — Telephone Encounter (Signed)
Put order in for physical labs. 

## 2011-04-09 ENCOUNTER — Other Ambulatory Visit (INDEPENDENT_AMBULATORY_CARE_PROVIDER_SITE_OTHER): Payer: Managed Care, Other (non HMO)

## 2011-04-09 ENCOUNTER — Other Ambulatory Visit: Payer: Self-pay | Admitting: Internal Medicine

## 2011-04-09 DIAGNOSIS — Z0000 Encounter for general adult medical examination without abnormal findings: Secondary | ICD-10-CM

## 2011-04-09 LAB — BASIC METABOLIC PANEL
BUN: 12 mg/dL (ref 6–23)
CO2: 29 mEq/L (ref 19–32)
Calcium: 9.2 mg/dL (ref 8.4–10.5)
Chloride: 103 mEq/L (ref 96–112)
Creatinine, Ser: 0.7 mg/dL (ref 0.4–1.2)
GFR: 86.38 mL/min (ref >60.00)
Glucose, Bld: 86 mg/dL (ref 70–99)
Potassium: 4.2 mEq/L (ref 3.5–5.1)
Sodium: 139 mEq/L (ref 135–145)

## 2011-04-09 LAB — HEPATIC FUNCTION PANEL
ALT: 10 U/L (ref 0–35)
AST: 15 U/L (ref 0–37)
Albumin: 3.9 g/dL (ref 3.5–5.2)
Alkaline Phosphatase: 90 U/L (ref 39–117)
Bilirubin, Direct: 0 mg/dL (ref 0.0–0.3)
Total Bilirubin: 0.2 mg/dL — ABNORMAL LOW (ref 0.3–1.2)
Total Protein: 7 g/dL (ref 6.0–8.3)

## 2011-04-09 LAB — TSH: TSH: 29.73 u[IU]/mL — ABNORMAL HIGH (ref 0.35–5.50)

## 2011-04-09 LAB — CBC WITH DIFFERENTIAL/PLATELET
Basophils Absolute: 0 10*3/uL (ref 0.0–0.1)
Basophils Relative: 0.6 % (ref 0.0–3.0)
Eosinophils Absolute: 0.1 10*3/uL (ref 0.0–0.7)
Eosinophils Relative: 1.6 % (ref 0.0–5.0)
HCT: 37.8 % (ref 36.0–46.0)
Hemoglobin: 12.8 g/dL (ref 12.0–15.0)
Lymphocytes Relative: 31.5 % (ref 12.0–46.0)
Lymphs Abs: 2.3 10*3/uL (ref 0.7–4.0)
MCHC: 33.9 g/dL (ref 30.0–36.0)
MCV: 89.8 fl (ref 78.0–100.0)
Monocytes Absolute: 0.5 10*3/uL (ref 0.1–1.0)
Monocytes Relative: 6.5 % (ref 3.0–12.0)
Neutro Abs: 4.4 10*3/uL (ref 1.4–7.7)
Neutrophils Relative %: 59.8 % (ref 43.0–77.0)
Platelets: 224 10*3/uL (ref 150.0–400.0)
RBC: 4.21 Mil/uL (ref 3.87–5.11)
RDW: 13.8 % (ref 11.5–14.6)
WBC: 7.3 10*3/uL (ref 4.5–10.5)

## 2011-04-09 LAB — URINALYSIS, ROUTINE W REFLEX MICROSCOPIC
Bilirubin Urine: NEGATIVE
Hgb urine dipstick: NEGATIVE
Ketones, ur: NEGATIVE
Leukocytes, UA: NEGATIVE
Nitrite: NEGATIVE
Specific Gravity, Urine: 1.02 (ref 1.000–1.030)
Total Protein, Urine: NEGATIVE
Urine Glucose: NEGATIVE
Urobilinogen, UA: 0.2 (ref 0.0–1.0)
pH: 6 (ref 5.0–8.0)

## 2011-04-09 LAB — LIPID PANEL
Cholesterol: 208 mg/dL — ABNORMAL HIGH (ref 0–200)
HDL: 67.9 mg/dL (ref >39.00)
Total CHOL/HDL Ratio: 3
Triglycerides: 200 mg/dL — ABNORMAL HIGH (ref 0.0–149.0)
VLDL: 40 mg/dL (ref 0.0–40.0)

## 2011-04-09 LAB — LDL CHOLESTEROL, DIRECT: Direct LDL: 114.4 mg/dL

## 2011-04-15 ENCOUNTER — Encounter: Payer: Self-pay | Admitting: Internal Medicine

## 2011-04-15 ENCOUNTER — Ambulatory Visit (INDEPENDENT_AMBULATORY_CARE_PROVIDER_SITE_OTHER): Payer: Managed Care, Other (non HMO) | Admitting: Internal Medicine

## 2011-04-15 VITALS — BP 112/70 | HR 79 | Temp 97.9°F | Ht 66.5 in | Wt 211.1 lb

## 2011-04-15 DIAGNOSIS — G8929 Other chronic pain: Secondary | ICD-10-CM

## 2011-04-15 DIAGNOSIS — M5090 Cervical disc disorder, unspecified, unspecified cervical region: Secondary | ICD-10-CM

## 2011-04-15 DIAGNOSIS — M542 Cervicalgia: Secondary | ICD-10-CM

## 2011-04-15 DIAGNOSIS — Z0000 Encounter for general adult medical examination without abnormal findings: Secondary | ICD-10-CM

## 2011-04-15 DIAGNOSIS — E039 Hypothyroidism, unspecified: Secondary | ICD-10-CM

## 2011-04-15 HISTORY — DX: Other chronic pain: G89.29

## 2011-04-15 HISTORY — DX: Cervical disc disorder, unspecified, unspecified cervical region: M50.90

## 2011-04-15 MED ORDER — HYDROCODONE-ACETAMINOPHEN 5-500 MG PO TABS
1.0000 | ORAL_TABLET | Freq: Two times a day (BID) | ORAL | Status: DC | PRN
Start: 2011-04-15 — End: 2011-10-15

## 2011-04-15 MED ORDER — LISINOPRIL 20 MG PO TABS: 20.0000 mg | ORAL_TABLET | Freq: Every day | ORAL | Status: DC

## 2011-04-15 MED ORDER — FLUTICASONE PROPIONATE 50 MCG/ACT NA SUSP: 2.0000 | Freq: Every day | NASAL | Status: DC

## 2011-04-15 MED ORDER — HYOSCYAMINE SULFATE 0.125 MG PO TBDP: 0.1250 mg | ORAL_TABLET | ORAL | Status: DC | PRN

## 2011-04-15 MED ORDER — LEVOTHYROXINE SODIUM 150 MCG PO TABS: 150.0000 ug | ORAL_TABLET | Freq: Every day | ORAL | Status: DC

## 2011-04-15 NOTE — Progress Notes (Signed)
Subjective:    Patient ID: [name], female    DOB: September 20, 1955, 56 y.o.   MRN: [id number]  HPI  Here for wellness and f/u;  Overall doing ok;  Pt denies CP, worsening SOB, DOE, wheezing, orthopnea, PND, worsening LE edema, palpitations, dizziness or syncope.  Pt denies neurological change such as new Headache, facial or extremity weakness.  Pt denies polydipsia, polyuria, or low sugar symptoms. Pt states overall good compliance with treatment and medications, good tolerability, and trying to follow lower cholesterol diet.  Pt denies worsening depressive symptoms, suicidal ideation or panic. No fever, wt loss, night sweats, loss of appetite, or other constitutional symptoms.  Pt states good ability with ADL's, low fall risk, home safety reviewed and adequate, no significant changes in hearing or vision, and occasionally active with exercise. Has had a few episodes in the past yr - ? Gastritis vs IBS vs other with pain/n-v/loose stools for 1-2 days then resolves.   Past Medical History  Diagnosis Date  . SINUSITIS- ACUTE-NOS 04/03/2010  . HYPERTENSION 01/10/2007  . HYPOTHYROIDISM 11/02/2006  . HYPERLIPIDEMIA 01/10/2007  . Chronic maxillary sinusitis 10/05/2008  . ALLERGIC RHINITIS 11/02/2006  . GERD 11/02/2006  . NEPHROLITHIASIS, HX OF 01/10/2007  . Chronic neck pain 04/15/2011  . Cervical disc disease 04/15/2011   Past Surgical History  Procedure Date  . Abdominal hysterectomy 1996  . Tonsillectomy 1961  . S/p selective nerve root block 2002     per Dr. Otelia Sergeant to right neck    reports that she has never smoked. She has never used smokeless tobacco. She reports that she does not drink alcohol or use illicit drugs. family history includes Diabetes in her other and Hypertension in her other. No Known Allergies No current outpatient prescriptions on file prior to visit.   Review of Systems Review of Systems  Constitutional: Negative for diaphoresis, activity change, appetite change and unexpected  weight change.  HENT: Negative for hearing loss, ear pain, facial swelling, mouth sores and neck stiffness.   Eyes: Negative for pain, redness and visual disturbance.  Respiratory: Negative for shortness of breath and wheezing.   Cardiovascular: Negative for chest pain and palpitations.  Gastrointestinal: Negative for diarrhea, blood in stool, abdominal distention and rectal pain.  Genitourinary: Negative for hematuria, flank pain and decreased urine volume.  Musculoskeletal: Negative for myalgias and joint swelling.  Skin: Negative for color change and wound.  Neurological: Negative for syncope and numbness.  Hematological: Negative for adenopathy.  Psychiatric/Behavioral: Negative for hallucinations, self-injury, decreased concentration and agitation.      Objective:   Physical Exam BP 112/70  Pulse 79  Temp(Src) 97.9 F (36.6 C) (Oral)  Ht 5' 6.5" (1.689 m)  Wt 211 lb 2 oz (95.766 kg)  BMI 33.57 kg/m2  SpO2 97% Physical Exam  VS noted Constitutional: Pt is oriented to person, place, and time. Appears well-developed and well-nourished.  HENT:  Head: Normocephalic and atraumatic.  Right Ear: External ear normal.  Left Ear: External ear normal.  Nose: Nose normal.  Mouth/Throat: Oropharynx is clear and moist.  Eyes: Conjunctivae and EOM are normal. Pupils are equal, round, and reactive to light.  Neck: Normal range of motion. Neck supple. No JVD present. No tracheal deviation present.  Cardiovascular: Normal rate, regular rhythm, normal heart sounds and intact distal pulses.   Pulmonary/Chest: Effort normal and breath sounds normal.  Abdominal: Soft. Bowel sounds are normal. There is no tenderness.  Musculoskeletal: Normal range of motion. Exhibits no edema.  Lymphadenopathy:  Has no cervical adenopathy.  Neurological: Pt is alert and oriented to person, place, and time. Pt has normal reflexes. No cranial nerve deficit.  Skin: Skin is warm and dry. No rash noted.    Psychiatric:  Has  normal mood and affect. Behavior is normal.     Assessment & Plan:

## 2011-04-15 NOTE — Patient Instructions (Addendum)
Increase the thyroid medication to 150 mcg per day Please return in 4 wks for LAB only  - TSH Continue all other medications as before Your refills were sent to the pharmacy Please return in 1 year for your yearly visit, or sooner if needed, with Lab testing done 3-5 days before

## 2011-04-15 NOTE — Assessment & Plan Note (Signed)

## 2011-04-19 ENCOUNTER — Encounter: Payer: Self-pay | Admitting: Internal Medicine

## 2011-04-19 NOTE — Assessment & Plan Note (Signed)
Med adjusted, for f/u tsh 4 wks  Lab Results  Component Value Date   TSH 29.73* 04/09/2011

## 2011-04-19 NOTE — Assessment & Plan Note (Signed)
stable overall by hx and exam, most recent data reviewed with pt, and pt to continue medical treatment as before 

## 2011-10-15 ENCOUNTER — Other Ambulatory Visit: Payer: Self-pay | Admitting: Internal Medicine

## 2011-10-15 NOTE — Telephone Encounter (Signed)
Done hardcopy to robin  

## 2011-10-15 NOTE — Telephone Encounter (Signed)
Faxed hardcopy to pharmacy. 

## 2011-12-18 ENCOUNTER — Other Ambulatory Visit (INDEPENDENT_AMBULATORY_CARE_PROVIDER_SITE_OTHER): Payer: Managed Care, Other (non HMO)

## 2011-12-18 ENCOUNTER — Encounter: Payer: Self-pay | Admitting: Internal Medicine

## 2011-12-18 ENCOUNTER — Ambulatory Visit (INDEPENDENT_AMBULATORY_CARE_PROVIDER_SITE_OTHER): Payer: Managed Care, Other (non HMO) | Admitting: Internal Medicine

## 2011-12-18 VITALS — BP 130/80 | HR 83 | Temp 97.8°F | Ht 66.5 in | Wt 207.5 lb

## 2011-12-18 DIAGNOSIS — E039 Hypothyroidism, unspecified: Secondary | ICD-10-CM

## 2011-12-18 DIAGNOSIS — L259 Unspecified contact dermatitis, unspecified cause: Secondary | ICD-10-CM

## 2011-12-18 DIAGNOSIS — IMO0002 Reserved for concepts with insufficient information to code with codable children: Secondary | ICD-10-CM

## 2011-12-18 DIAGNOSIS — L03113 Cellulitis of right upper limb: Secondary | ICD-10-CM

## 2011-12-18 LAB — TSH: TSH: 1.26 u[IU]/mL (ref 0.35–5.50)

## 2011-12-18 MED ORDER — AZITHROMYCIN 250 MG PO TABS: ORAL_TABLET | ORAL | Status: AC

## 2011-12-18 MED ORDER — METHYLPREDNISOLONE ACETATE 80 MG/ML IJ SUSP
120.0000 mg | Freq: Once | INTRAMUSCULAR | Status: AC
  Administered 2011-12-18: 120 mg via INTRAMUSCULAR

## 2011-12-18 MED ORDER — PREDNISONE 10 MG PO TABS: ORAL_TABLET | ORAL | Status: DC

## 2011-12-18 MED ORDER — HYDROXYZINE HCL 25 MG PO TABS: 25.0000 mg | ORAL_TABLET | Freq: Three times a day (TID) | ORAL | Status: AC | PRN

## 2011-12-18 NOTE — Patient Instructions (Addendum)
You had the steroid shot today Take all new medications as prescribed - the prednisone, and antibiotic You should also use topical Triple Antibiotic to the right arm area until healed Please go to LAB in the Basement for the blood and/or urine tests to be done today - JUST the thyroid test today Continue all other medications as before Please have the pharmacy call with any refills you may need.

## 2011-12-19 ENCOUNTER — Encounter: Payer: Self-pay | Admitting: Internal Medicine

## 2011-12-19 NOTE — Assessment & Plan Note (Signed)
Mild to mod, for depomedrol IM,and predpack asd,  to f/u any worsening symptoms or concerns 

## 2011-12-19 NOTE — Progress Notes (Signed)
Subjective:    Patient ID: [name], female    DOB: 12-Apr-1955, 56 y.o.   MRN: [id number]  HPI  Here to f/u with c/o initial poison ivy like rash for 1 wk to several areas, with largest area to right inner elbow, and in the past 2 days with increased pain/red/tender/swelling after scratching and some excoriations.  Pt denies chest pain, increased sob or doe, wheezing, orthopnea, PND, increased LE swelling, palpitations, dizziness or syncope.   Pt denies fever, wt loss, night sweats, loss of appetite, or other constitutional symptoms  Pt denies new neurological symptoms such as new headache, or facial or extremity weakness or numbness   Pt denies polydipsia, polyuria.  Denies hyper or hypo thyroid symptoms such as voice, skin or hair change. Past Medical History  Diagnosis Date  . SINUSITIS- ACUTE-NOS 04/03/2010  . HYPERTENSION 01/10/2007  . HYPOTHYROIDISM 11/02/2006  . HYPERLIPIDEMIA 01/10/2007  . Chronic maxillary sinusitis 10/05/2008  . ALLERGIC RHINITIS 11/02/2006  . GERD 11/02/2006  . NEPHROLITHIASIS, HX OF 01/10/2007  . Chronic neck pain 04/15/2011  . Cervical disc disease 04/15/2011   Past Surgical History  Procedure Date  . Abdominal hysterectomy 1996  . Tonsillectomy 1961  . S/p selective nerve root block 2002     per Dr. Otelia Sergeant to right neck    reports that she has never smoked. She has never used smokeless tobacco. She reports that she does not drink alcohol or use illicit drugs. family history includes Diabetes in her other and Hypertension in her other. No Known Allergies Current Outpatient Prescriptions on File Prior to Visit  Medication Sig Dispense Refill  . estradiol (ESTRACE) 2 MG tablet Take 2 mg by mouth daily.      . fluticasone (FLONASE) 50 MCG/ACT nasal spray Place 2 sprays into the nose daily.  16 g  6  . HYDROcodone-acetaminophen (VICODIN) 5-500 MG per tablet TAKE 1 TABLET BY MOUTH TWICE DAILY AS NEEDED FOR PAIN  60 tablet  2  . hyoscyamine (ANASPAZ) 0.125 MG TBDP  Place 1 tablet (0.125 mg total) under the tongue every 4 (four) hours as needed.  60 tablet  5  . levothyroxine (SYNTHROID, LEVOTHROID) 150 MCG tablet Take 1 tablet (150 mcg total) by mouth daily.  90 tablet  3  . lisinopril (PRINIVIL,ZESTRIL) 20 MG tablet Take 1 tablet (20 mg total) by mouth daily.  90 tablet  3   Review of Systems  Constitutional: Negative for diaphoresis and unexpected weight change.  HENT: Negative for tinnitus.   Eyes: Negative for photophobia and visual disturbance.  Respiratory: Negative for choking and stridor.   Gastrointestinal: Negative for vomiting and blood in stool.  Genitourinary: Negative for hematuria and decreased urine volume.  Musculoskeletal: Negative for gait problem.  Neurological: Negative for tremors and numbness.  Psychiatric/Behavioral: Negative for decreased concentration. The patient is not hyperactive.       Objective:   Physical Exam BP 130/80  Pulse 83  Temp 97.8 F (36.6 C) (Oral)  Ht 5' 6.5" (1.689 m)  Wt 207 lb 8 oz (94.121 kg)  BMI 32.99 kg/m2  SpO2 97% Physical Exam  VS noted, not ill appearing Constitutional: Pt appears well-developed and well-nourished.  HENT: Head: Normocephalic.  Right Ear: External ear normal.  Left Ear: External ear normal.  Eyes: Conjunctivae and EOM are normal. Pupils are equal, round, and reactive to light.  Neck: Normal range of motion. Neck supple.  Cardiovascular: Normal rate and regular rhythm.   Pulmonary/Chest: Effort normal and breath  sounds normal.  Neurological: Pt is alert. Not confused  Skin: right distal upper arm/prox arm with approx 5 cm area red/tender/swelling without red streaks, drainage; several other erythem spots c/w poison ivy like rash Psychiatric: Pt behavior is normal. Thought content normal. mild nervous    Assessment & Plan:

## 2011-12-19 NOTE — Assessment & Plan Note (Signed)
stable overall by hx and exam, most recent data reviewed with pt, and pt to continue medical treatment as before . For tsh today,  Lab Results  Component Value Date   TSH 1.26 12/18/2011

## 2011-12-19 NOTE — Assessment & Plan Note (Signed)
Mild to mod, for antibx course,  to f/u any worsening symptoms or concerns 

## 2012-04-12 ENCOUNTER — Other Ambulatory Visit (INDEPENDENT_AMBULATORY_CARE_PROVIDER_SITE_OTHER): Payer: Managed Care, Other (non HMO)

## 2012-04-12 DIAGNOSIS — E039 Hypothyroidism, unspecified: Secondary | ICD-10-CM

## 2012-04-12 LAB — URINALYSIS, ROUTINE W REFLEX MICROSCOPIC
Bilirubin Urine: NEGATIVE
Ketones, ur: NEGATIVE
Leukocytes, UA: NEGATIVE
Nitrite: NEGATIVE
Specific Gravity, Urine: >=1.03 (ref 1.000–1.030)
Total Protein, Urine: NEGATIVE
Urine Glucose: NEGATIVE
Urobilinogen, UA: 0.2 (ref 0.0–1.0)
pH: 6 (ref 5.0–8.0)

## 2012-04-12 LAB — BASIC METABOLIC PANEL
BUN: 12 mg/dL (ref 6–23)
CO2: 27 mEq/L (ref 19–32)
Calcium: 8.9 mg/dL (ref 8.4–10.5)
Chloride: 103 mEq/L (ref 96–112)
Creatinine, Ser: 0.7 mg/dL (ref 0.4–1.2)
GFR: 98.21 mL/min (ref >60.00)
Glucose, Bld: 89 mg/dL (ref 70–99)
Potassium: 3.9 mEq/L (ref 3.5–5.1)
Sodium: 137 mEq/L (ref 135–145)

## 2012-04-12 LAB — CBC WITH DIFFERENTIAL/PLATELET
Basophils Absolute: 0 10*3/uL (ref 0.0–0.1)
Basophils Relative: 0.6 % (ref 0.0–3.0)
Eosinophils Absolute: 0.1 10*3/uL (ref 0.0–0.7)
Eosinophils Relative: 1.7 % (ref 0.0–5.0)
HCT: 36.6 % (ref 36.0–46.0)
Hemoglobin: 12.3 g/dL (ref 12.0–15.0)
Lymphocytes Relative: 31.5 % (ref 12.0–46.0)
Lymphs Abs: 2.2 10*3/uL (ref 0.7–4.0)
MCHC: 33.5 g/dL (ref 30.0–36.0)
MCV: 89.1 fl (ref 78.0–100.0)
Monocytes Absolute: 0.5 10*3/uL (ref 0.1–1.0)
Monocytes Relative: 7.3 % (ref 3.0–12.0)
Neutro Abs: 4 10*3/uL (ref 1.4–7.7)
Neutrophils Relative %: 58.9 % (ref 43.0–77.0)
Platelets: 211 10*3/uL (ref 150.0–400.0)
RBC: 4.11 Mil/uL (ref 3.87–5.11)
RDW: 12.8 % (ref 11.5–14.6)
WBC: 6.9 10*3/uL (ref 4.5–10.5)

## 2012-04-12 LAB — HEPATIC FUNCTION PANEL
ALT: 12 U/L (ref 0–35)
AST: 13 U/L (ref 0–37)
Albumin: 3.5 g/dL (ref 3.5–5.2)
Alkaline Phosphatase: 83 U/L (ref 39–117)
Bilirubin, Direct: 0.1 mg/dL (ref 0.0–0.3)
Total Bilirubin: 0.6 mg/dL (ref 0.3–1.2)
Total Protein: 6.7 g/dL (ref 6.0–8.3)

## 2012-04-12 LAB — LIPID PANEL
Cholesterol: 174 mg/dL (ref 0–200)
HDL: 54.8 mg/dL (ref >39.00)
LDL Cholesterol: 84 mg/dL (ref 0–99)
Total CHOL/HDL Ratio: 3
Triglycerides: 175 mg/dL — ABNORMAL HIGH (ref 0.0–149.0)
VLDL: 35 mg/dL (ref 0.0–40.0)

## 2012-04-12 LAB — TSH: TSH: 0.55 u[IU]/mL (ref 0.35–5.50)

## 2012-04-15 ENCOUNTER — Encounter: Payer: Self-pay | Admitting: Internal Medicine

## 2012-04-15 ENCOUNTER — Ambulatory Visit (INDEPENDENT_AMBULATORY_CARE_PROVIDER_SITE_OTHER): Payer: Managed Care, Other (non HMO) | Admitting: Internal Medicine

## 2012-04-15 VITALS — BP 120/80 | HR 81 | Temp 97.1°F | Ht 66.5 in | Wt 212.2 lb

## 2012-04-15 DIAGNOSIS — Z0000 Encounter for general adult medical examination without abnormal findings: Secondary | ICD-10-CM

## 2012-04-15 DIAGNOSIS — I10 Essential (primary) hypertension: Secondary | ICD-10-CM

## 2012-04-15 NOTE — Assessment & Plan Note (Addendum)
ECG reviewed as per emr, stable overall by history and exam, recent data reviewed with pt, and pt to continue medical treatment as before,  to f/u any worsening symptoms or concerns BP Readings from Last 3 Encounters:  04/15/12 120/80  12/18/11 130/80  04/15/11 112/70

## 2012-04-15 NOTE — Patient Instructions (Addendum)
Continue all other medications as before Please have the pharmacy call with any other refills you may need. Please continue your efforts at being more active, low cholesterol diet, and weight control. You are otherwise up to date with prevention measures Thank you for enrolling in MyChart. Please follow the instructions below to securely access your online medical record. MyChart allows you to send messages to your doctor, view your test results, renew your prescriptions, schedule appointments, and more. To Log into MyChart, please go to [email or web address], and your Username is: [email or web address] Please return in 1 year for your yearly visit, or sooner if needed, with Lab testing done 3-5 days before

## 2012-04-16 ENCOUNTER — Encounter: Payer: Self-pay | Admitting: Internal Medicine

## 2012-04-16 NOTE — Progress Notes (Signed)
Subjective:    Patient ID: [name], female    DOB: [birth year], 57 y.o.   MRN: [id number]  HPI  Here for wellness and f/u;  Overall doing ok;  Pt denies CP, worsening SOB, DOE, wheezing, orthopnea, PND, worsening LE edema, palpitations, dizziness or syncope.  Pt denies neurological change such as new headache, facial or extremity weakness.  Pt denies polydipsia, polyuria, or low sugar symptoms. Pt states overall good compliance with treatment and medications, good tolerability, and has been trying to follow lower cholesterol diet.  Pt denies worsening depressive symptoms, suicidal ideation or panic. No fever, night sweats, wt loss, loss of appetite, or other constitutional symptoms.  Pt states good ability with ADL's, has low fall risk, home safety reviewed and adequate, no other significant changes in hearing or vision, and only occasionally active with exercise.  No new complaints or concerns Past Medical History  Diagnosis Date  . SINUSITIS- ACUTE-NOS 04/03/2010  . HYPERTENSION 01/10/2007  . HYPOTHYROIDISM 11/02/2006  . HYPERLIPIDEMIA 01/10/2007  . Chronic maxillary sinusitis 10/05/2008  . ALLERGIC RHINITIS 11/02/2006  . GERD 11/02/2006  . NEPHROLITHIASIS, HX OF 01/10/2007  . Chronic neck pain 04/15/2011  . Cervical disc disease 04/15/2011   Past Surgical History  Procedure Date  . Abdominal hysterectomy 1996  . Tonsillectomy 1961  . S/p selective nerve root block 2002     per Dr. Otelia Sergeant to right neck    reports that she has never smoked. She has never used smokeless tobacco. She reports that she does not drink alcohol or use illicit drugs. family history includes Diabetes in her other and Hypertension in her other. No Known Allergies Current Outpatient Prescriptions on File Prior to Visit  Medication Sig Dispense Refill  . estradiol (ESTRACE) 2 MG tablet Take 2 mg by mouth daily.      . fluticasone (FLONASE) 50 MCG/ACT nasal spray Place 2 sprays into the nose daily.  16 g  6  .  HYDROcodone-acetaminophen (VICODIN) 5-500 MG per tablet TAKE 1 TABLET BY MOUTH TWICE DAILY AS NEEDED FOR PAIN  60 tablet  2  . hyoscyamine (ANASPAZ) 0.125 MG TBDP Place 1 tablet (0.125 mg total) under the tongue every 4 (four) hours as needed.  60 tablet  5  . lisinopril (PRINIVIL,ZESTRIL) 20 MG tablet Take 1 tablet (20 mg total) by mouth daily.  90 tablet  3  . levothyroxine (SYNTHROID, LEVOTHROID) 150 MCG tablet Take 1 tablet (150 mcg total) by mouth daily.  90 tablet  3  . predniSONE (DELTASONE) 10 MG tablet 2 tabs by mouth per day for 7 days  14 tablet  0   Review of Systems Constitutional: Negative for diaphoresis, activity change, appetite change or unexpected weight change.  HENT: Negative for hearing loss, ear pain, facial swelling, mouth sores and neck stiffness.   Eyes: Negative for pain, redness and visual disturbance.  Respiratory: Negative for shortness of breath and wheezing.   Cardiovascular: Negative for chest pain and palpitations.  Gastrointestinal: Negative for diarrhea, blood in stool, abdominal distention or other pain Genitourinary: Negative for hematuria, flank pain or change in urine volume.  Musculoskeletal: Negative for myalgias and joint swelling.  Skin: Negative for color change and wound.  Neurological: Negative for syncope and numbness. other than noted Hematological: Negative for adenopathy.  Psychiatric/Behavioral: Negative for hallucinations, self-injury, decreased concentration and agitation.      Objective:   Physical Exam BP 120/80  Pulse 81  Temp 97.1 F (36.2 C) (Oral)  Ht 5' 6.5" (  1.689 m)  Wt 212 lb 4 oz (96.276 kg)  BMI 33.74 kg/m2  SpO2 99% Physical Exam  VS noted,  Constitutional: Pt is oriented to person, place, and time. Appears well-developed and well-nourished.  Head: Normocephalic and atraumatic.  Right Ear: External ear normal.  Left Ear: External ear normal.  Nose: Nose normal.  Mouth/Throat: Oropharynx is clear and moist.  Eyes:  Conjunctivae and EOM are normal. Pupils are equal, round, and reactive to light.  Neck: Normal range of motion. Neck supple. No JVD present. No tracheal deviation present.  Cardiovascular: Normal rate, regular rhythm, normal heart sounds and intact distal pulses.   Pulmonary/Chest: Effort normal and breath sounds normal.  Abdominal: Soft. Bowel sounds are normal. There is no tenderness. No HSM  Musculoskeletal: Normal range of motion. Exhibits no edema.  Lymphadenopathy:  Has no cervical adenopathy.  Neurological: Pt is alert and oriented to person, place, and time. Pt has normal reflexes. No cranial nerve deficit.  Skin: Skin is warm and dry. No rash noted.  Psychiatric:  Has  normal mood and affect. Behavior is normal.     Assessment & Plan:

## 2012-04-16 NOTE — Assessment & Plan Note (Signed)

## 2012-04-24 ENCOUNTER — Other Ambulatory Visit: Payer: Self-pay | Admitting: Internal Medicine

## 2012-04-25 MED ORDER — HYDROCODONE-ACETAMINOPHEN 5-325 MG PO TABS: 1.0000 | ORAL_TABLET | Freq: Four times a day (QID) | ORAL | Status: DC | PRN

## 2012-04-25 NOTE — Telephone Encounter (Signed)
Faxed hardcopy to pharmacy. 

## 2012-04-25 NOTE — Telephone Encounter (Signed)
fda no longer allows the 5/500 hydrocodone  Hydrocodone 5/325  - Done hardcopy to robin

## 2012-04-27 ENCOUNTER — Other Ambulatory Visit: Payer: Self-pay | Admitting: Internal Medicine

## 2012-04-29 ENCOUNTER — Ambulatory Visit (INDEPENDENT_AMBULATORY_CARE_PROVIDER_SITE_OTHER): Payer: Managed Care, Other (non HMO) | Admitting: Internal Medicine

## 2012-04-29 ENCOUNTER — Encounter: Payer: Self-pay | Admitting: Internal Medicine

## 2012-04-29 VITALS — BP 110/80 | HR 87 | Temp 97.9°F | Ht 66.5 in | Wt 210.1 lb

## 2012-04-29 DIAGNOSIS — J019 Acute sinusitis, unspecified: Secondary | ICD-10-CM

## 2012-04-29 DIAGNOSIS — E039 Hypothyroidism, unspecified: Secondary | ICD-10-CM

## 2012-04-29 DIAGNOSIS — I10 Essential (primary) hypertension: Secondary | ICD-10-CM

## 2012-04-29 MED ORDER — HYDROCODONE-HOMATROPINE 5-1.5 MG/5ML PO SYRP: 5.0000 mL | ORAL_SOLUTION | Freq: Four times a day (QID) | ORAL | Status: DC | PRN

## 2012-04-29 MED ORDER — LEVOFLOXACIN 250 MG PO TABS: 250.0000 mg | ORAL_TABLET | Freq: Every day | ORAL | Status: DC

## 2012-04-29 NOTE — Assessment & Plan Note (Signed)
Mild to mod, for antibx course,  to f/u any worsening symptoms or concerns 

## 2012-04-29 NOTE — Progress Notes (Signed)
Subjective:    Patient ID: [name], female    DOB: 09/03/1955, 57 y.o.   MRN: [id number]  HPI   Here with 2-3 days acute onset fever, facial pain, pressure, headache, general weakness and malaise, and greenish d/c, with mild ST surprisingly frequent non prod cough, but pt denies chest pain, wheezing, increased sob or doe, orthopnea, PND, increased LE swelling, palpitations, dizziness or syncope.  Pt denies polydipsia, polyuria.  Pt denies new neurological symptoms such as new headache, or facial or extremity weakness or numbness. Denies hyper or hypo thyroid symptoms such as voice, skin or hair change. Past Medical History  Diagnosis Date  . SINUSITIS- ACUTE-NOS 04/03/2010  . HYPERTENSION 01/10/2007  . HYPOTHYROIDISM 11/02/2006  . HYPERLIPIDEMIA 01/10/2007  . Chronic maxillary sinusitis 10/05/2008  . ALLERGIC RHINITIS 11/02/2006  . GERD 11/02/2006  . NEPHROLITHIASIS, HX OF 01/10/2007  . Chronic neck pain 04/15/2011  . Cervical disc disease 04/15/2011   Past Surgical History  Procedure Date  . Abdominal hysterectomy 1996  . Tonsillectomy 1961  . S/p selective nerve root block 2002     per Dr. Otelia Sergeant to right neck    reports that she has never smoked. She has never used smokeless tobacco. She reports that she does not drink alcohol or use illicit drugs. family history includes Diabetes in her other and Hypertension in her other. No Known Allergies Current Outpatient Prescriptions on File Prior to Visit  Medication Sig Dispense Refill  . estradiol (ESTRACE) 2 MG tablet Take 2 mg by mouth daily.      . fluticasone (FLONASE) 50 MCG/ACT nasal spray INSTILL 2 SPRAYS INTO THE NOSE DAILY AS DIRECTED  16 g  11  . HYDROcodone-acetaminophen (NORCO/VICODIN) 5-325 MG per tablet Take 1 tablet by mouth every 6 (six) hours as needed for pain.  60 tablet  2  . hyoscyamine (ANASPAZ) 0.125 MG TBDP Place 1 tablet (0.125 mg total) under the tongue every 4 (four) hours as needed.  60 tablet  5  . levothyroxine  (SYNTHROID, LEVOTHROID) 150 MCG tablet TAKE 1 TABLET BY MOUTH DAILY  90 tablet  3  . lisinopril (PRINIVIL,ZESTRIL) 20 MG tablet TAKE 1 TABLET BY MOUTH DAILY  90 tablet  3   Review of Systems  Constitutional: Negative for unexpected weight change, or unusual diaphoresis  HENT: Negative for tinnitus.   Eyes: Negative for photophobia and visual disturbance.  Respiratory: Negative for choking and stridor.   Gastrointestinal: Negative for vomiting and blood in stool.  Genitourinary: Negative for hematuria and decreased urine volume.  Musculoskeletal: Negative for acute joint swelling Skin: Negative for color change and wound.  Neurological: Negative for tremors and numbness other than noted  Psychiatric/Behavioral: Negative for decreased concentration or  hyperactivity.       Objective:   Physical Exam BP 110/80  Pulse 87  Temp 97.9 F (36.6 C) (Oral)  Ht 5' 6.5" (1.689 m)  Wt 210 lb 2 oz (95.312 kg)  BMI 33.41 kg/m2  SpO2 97% VS noted, mild ill Constitutional: Pt appears well-developed and well-nourished.  HENT: Head: NCAT.  Right Ear: External ear normal.  Left Ear: External ear normal.  Bilat tm's with mild erythema.  Max sinus areas mod tender.  Pharynx with mild erythema, no exudate Eyes: Conjunctivae and EOM are normal. Pupils are equal, round, and reactive to light.  Neck: Normal range of motion. Neck supple.  Cardiovascular: Normal rate and regular rhythm.   Pulmonary/Chest: Effort normal and breath sounds normal.  - no rales  or wheezing Neurological: Pt is alert. Not confused  Skin: Skin is warm. No erythema.  Psychiatric: Pt behavior is normal. Thought content normal.     Assessment & Plan:

## 2012-04-29 NOTE — Assessment & Plan Note (Signed)
stable overall by history and exam, recent data reviewed with pt, and pt to continue medical treatment as before,  to f/u any worsening symptoms or concerns Lab Results  Component Value Date   TSH 0.55 04/12/2012

## 2012-04-29 NOTE — Patient Instructions (Addendum)
Please take all new medication as prescribed Please continue all other medications as before,

## 2012-04-29 NOTE — Assessment & Plan Note (Signed)
stable overall by history and exam, recent data reviewed with pt, and pt to continue medical treatment as before,  to f/u any worsening symptoms or concerns BP Readings from Last 3 Encounters:  04/29/12 110/80  04/15/12 120/80  12/18/11 130/80

## 2012-05-13 ENCOUNTER — Telehealth: Payer: Self-pay | Admitting: Internal Medicine

## 2012-05-13 MED ORDER — LEVOFLOXACIN 250 MG PO TABS: 250.0000 mg | ORAL_TABLET | Freq: Every day | ORAL | Status: DC

## 2012-05-13 NOTE — Telephone Encounter (Signed)
Patient Information:  Caller Name: Alaynna  Phone: [phone]  Patient: [name], [name]  Gender: Female  DOB: [birth year]  Age: 57 Years  PCP: Oliver Barre (Adults only)  Office Follow Up:  Does the office need to follow up with this patient?: Yes  Instructions For The Office: Still having yellow nasal drainage and colored sputum after finishing antibiotic.  Is requesting a second one be sent to [facility] on [address].  RN Note:  Patient denies fever or change in appetite that is significant.  Emergent symptoms denied.  However still blowing out color from sinuses - some blood tinged; coughing up yellow mucus.  Ear still feels not right.  Was seen by Dr. Jonny Ruiz on 04/29/12 for acute sinusitis and treated with Levaquin- last dose was 02/02.  Cough remains--patient states while some better does not feel like she is over it and feels about the same as she did when she first came to see him.  She is requesting for another antibiotic to be called in to [facility] on [address] if possible.  Symptoms  Reason For Call & Symptoms: Was seem about Jan 24th and was treated Sinus infection, ear affected and cough-coughing up yellow.  Finished 02/02 the antibiotic  Reviewed Health History In EMR: Yes  Reviewed Medications In EMR: Yes  Reviewed Allergies In EMR: Yes  Reviewed Surgeries / Procedures: Yes  Date of Onset of Symptoms: 04/29/2012  Treatments Tried: Cough medicine and antibiotic  Treatments Tried Worked: No  Guideline(s) Used:  No Protocol Available - Sick Adult  Disposition Per Guideline:   Discuss with PCP and Callback by Nurse Today  Reason For Disposition Reached:   Nursing judgment  Advice Given:  Call Back If:  You become worse.

## 2012-05-13 NOTE — Telephone Encounter (Signed)
Done erx 

## 2012-05-13 NOTE — Telephone Encounter (Signed)
Patient informed. 

## 2012-06-21 ENCOUNTER — Telehealth: Payer: Self-pay

## 2012-06-21 NOTE — Telephone Encounter (Signed)
[name] called requesting confirmation that labs completed 04/12/2012 where a part of the Preventative Health Visit on 04/15/2012. Confirmation given via secure VM

## 2012-07-20 ENCOUNTER — Telehealth: Payer: Self-pay

## 2012-07-20 MED ORDER — HYOSCYAMINE SULFATE 0.125 MG PO TBDP: 0.1250 mg | ORAL_TABLET | ORAL | Status: DC | PRN

## 2012-07-20 MED ORDER — LISINOPRIL 20 MG PO TABS: 20.0000 mg | ORAL_TABLET | Freq: Every day | ORAL | Status: DC

## 2012-07-20 MED ORDER — LEVOTHYROXINE SODIUM 150 MCG PO TABS: 150.0000 ug | ORAL_TABLET | Freq: Every day | ORAL | Status: DC

## 2012-07-20 NOTE — Telephone Encounter (Signed)
Medication refills to [facility].

## 2012-09-05 LAB — HM MAMMOGRAPHY

## 2012-09-22 ENCOUNTER — Other Ambulatory Visit: Payer: Self-pay | Admitting: Internal Medicine

## 2012-09-22 NOTE — Telephone Encounter (Signed)
Done hardcopy to robin  

## 2012-09-23 NOTE — Telephone Encounter (Signed)
Faxed hardcopy to [facility] 

## 2012-12-27 ENCOUNTER — Ambulatory Visit (INDEPENDENT_AMBULATORY_CARE_PROVIDER_SITE_OTHER): Payer: Managed Care, Other (non HMO) | Admitting: Internal Medicine

## 2012-12-27 ENCOUNTER — Encounter: Payer: Self-pay | Admitting: Internal Medicine

## 2012-12-27 VITALS — BP 128/90 | HR 90 | Temp 98.0°F | Ht 66.5 in | Wt 211.0 lb

## 2012-12-27 DIAGNOSIS — I10 Essential (primary) hypertension: Secondary | ICD-10-CM

## 2012-12-27 DIAGNOSIS — J209 Acute bronchitis, unspecified: Secondary | ICD-10-CM

## 2012-12-27 MED ORDER — AZITHROMYCIN 250 MG PO TABS: ORAL_TABLET | ORAL | Status: DC

## 2012-12-27 MED ORDER — HYDROCODONE-HOMATROPINE 5-1.5 MG/5ML PO SYRP: 5.0000 mL | ORAL_SOLUTION | Freq: Four times a day (QID) | ORAL | Status: DC | PRN

## 2012-12-27 NOTE — Assessment & Plan Note (Signed)
Mild to mod, for antibx course,  to f/u any worsening symptoms or concerns, for cough med as well

## 2012-12-27 NOTE — Progress Notes (Signed)
  Subjective:    Patient ID: [name], female    DOB: 1955-10-04, 57 y.o.   MRN: [id number]  HPI Here with acute onset mild to mod 2-3 days ST, HA, general weakness and malaise, with prod cough greenish sputum, but Pt denies chest pain, increased doe but has some sob, but no wheezing, orthopnea, PND, increased LE swelling, palpitations, dizziness or syncope.  Has some fleeting CP diffusely ant with cough, as well as upper thoracic area pain with cough only.  Mult persons at work ill with similar Past Medical History  Diagnosis Date  . SINUSITIS- ACUTE-NOS 04/03/2010  . HYPERTENSION 01/10/2007  . HYPOTHYROIDISM 11/02/2006  . HYPERLIPIDEMIA 01/10/2007  . Chronic maxillary sinusitis 10/05/2008  . ALLERGIC RHINITIS 11/02/2006  . GERD 11/02/2006  . NEPHROLITHIASIS, HX OF 01/10/2007  . Chronic neck pain 04/15/2011  . Cervical disc disease 04/15/2011   Past Surgical History  Procedure Laterality Date  . Abdominal hysterectomy  1996  . Tonsillectomy  1961  . S/p selective nerve root block 2002      per Dr. Otelia Sergeant to right neck    reports that she has never smoked. She has never used smokeless tobacco. She reports that she does not drink alcohol or use illicit drugs. family history includes Diabetes in her other; Hypertension in her other. No Known Allergies Current Outpatient Prescriptions on File Prior to Visit  Medication Sig Dispense Refill  . estradiol (ESTRACE) 2 MG tablet Take 2 mg by mouth daily.      . fluticasone (FLONASE) 50 MCG/ACT nasal spray INSTILL 2 SPRAYS INTO THE NOSE DAILY AS DIRECTED  16 g  11  . HYDROcodone-acetaminophen (NORCO/VICODIN) 5-325 MG per tablet TAKE ONE TABLET BY MOUTH EVERY 6 HOURS AS NEEDED FOR PAIN  60 tablet  2  . hyoscyamine (ANASPAZ) 0.125 MG TBDP Place 1 tablet (0.125 mg total) under the tongue every 4 (four) hours as needed.  60 tablet  5  . levothyroxine (SYNTHROID, LEVOTHROID) 150 MCG tablet Take 1 tablet (150 mcg total) by mouth daily.  90 tablet  3  .  lisinopril (PRINIVIL,ZESTRIL) 20 MG tablet Take 1 tablet (20 mg total) by mouth daily.  90 tablet  3   No current facility-administered medications on file prior to visit.   Review of Systems All otherwise neg per pt     Objective:   Physical Exam BP 128/90  Pulse 90  Temp(Src) 98 F (36.7 C) (Oral)  Ht 5' 6.5" (1.689 m)  Wt 211 lb (95.709 kg)  BMI 33.55 kg/m2  SpO2 97% VS noted, mild ill Constitutional: Pt appears well-developed and well-nourished.  HENT: Head: NCAT.  Right Ear: External ear normal.  Left Ear: External ear normal.  Eyes: Conjunctivae and EOM are normal. Pupils are equal, round, and reactive to light.  Bilat tm's with mild erythema.  Max sinus areas non tender.  Pharynx with mild erythema, no exudate Neck: Normal range of motion. Neck supple. bilat submandib LA tender noted Cardiovascular: Normal rate and regular rhythm.   Pulmonary/Chest: Effort normal and breath sounds normal.  Abd:  Soft, NT, non-distended, + BS Neurological: Pt is alert. Not confused  Skin: Skin is warm. No erythema.  Psychiatric: Pt behavior is normal. Thought content normal.     Assessment & Plan:

## 2012-12-27 NOTE — Patient Instructions (Signed)
Please take all new medication as prescribed Please continue all other medications as before  Please return for any worsening fever, chest pain, shortness of breath if needd  Please remember to sign up for My Chart if you have not done so, as this will be important to you in the future with finding out test results, communicating by private email, and scheduling acute appointments online when needed.

## 2012-12-27 NOTE — Assessment & Plan Note (Signed)
stable overall by history and exam, recent data reviewed with pt, and pt to continue medical treatment as before,  to f/u any worsening symptoms or concerns BP Readings from Last 3 Encounters:  12/27/12 128/90  04/29/12 110/80  04/15/12 120/80

## 2013-02-09 ENCOUNTER — Other Ambulatory Visit: Payer: Self-pay

## 2013-02-14 ENCOUNTER — Ambulatory Visit (INDEPENDENT_AMBULATORY_CARE_PROVIDER_SITE_OTHER): Payer: Managed Care, Other (non HMO) | Admitting: Internal Medicine

## 2013-02-14 ENCOUNTER — Encounter: Payer: Self-pay | Admitting: Internal Medicine

## 2013-02-14 VITALS — BP 132/86 | HR 87 | Temp 97.8°F | Ht 66.5 in | Wt 208.0 lb

## 2013-02-14 DIAGNOSIS — M542 Cervicalgia: Secondary | ICD-10-CM

## 2013-02-14 DIAGNOSIS — J019 Acute sinusitis, unspecified: Secondary | ICD-10-CM | POA: Insufficient documentation

## 2013-02-14 DIAGNOSIS — G8929 Other chronic pain: Secondary | ICD-10-CM

## 2013-02-14 DIAGNOSIS — I10 Essential (primary) hypertension: Secondary | ICD-10-CM

## 2013-02-14 MED ORDER — HYDROCODONE-ACETAMINOPHEN 5-325 MG PO TABS: ORAL_TABLET | ORAL | Status: DC

## 2013-02-14 MED ORDER — LEVOFLOXACIN 500 MG PO TABS: 500.0000 mg | ORAL_TABLET | Freq: Every day | ORAL | Status: DC

## 2013-02-14 MED ORDER — HYDROCODONE-HOMATROPINE 5-1.5 MG/5ML PO SYRP: 5.0000 mL | ORAL_SOLUTION | Freq: Four times a day (QID) | ORAL | Status: DC | PRN

## 2013-02-14 NOTE — Progress Notes (Signed)
Subjective:    Patient ID: [name], female    DOB: [birth year], 57 y.o.   MRN: [id number]  HPI   Here with 2-3 days acute onset fever, facial pain, pressure, headache, general weakness and malaise, and greenish d/c, with mild ST and cough, but pt denies chest pain, wheezing, increased sob or doe, orthopnea, PND, increased LE swelling, palpitations, dizziness or syncope. Has recurrent acute on chronic, has seen ENT and allergy and CT sinus in past.   Pt denies polydipsia, polyuria, Pt continues to have recurring LBP without change in severity, bowel or bladder change, fever, wt loss,  worsening LE pain/numbness/weakness, gait change or falls. Taking hdyrocodone rarely, up to one per day Past Medical History  Diagnosis Date  . SINUSITIS- ACUTE-NOS 04/03/2010  . HYPERTENSION 01/10/2007  . HYPOTHYROIDISM 11/02/2006  . HYPERLIPIDEMIA 01/10/2007  . Chronic maxillary sinusitis 10/05/2008  . ALLERGIC RHINITIS 11/02/2006  . GERD 11/02/2006  . NEPHROLITHIASIS, HX OF 01/10/2007  . Chronic neck pain 04/15/2011  . Cervical disc disease 04/15/2011   Past Surgical History  Procedure Laterality Date  . Abdominal hysterectomy  1996  . Tonsillectomy  1961  . S/p selective nerve root block 2002      per Dr. Otelia Sergeant to right neck    reports that she has never smoked. She has never used smokeless tobacco. She reports that she does not drink alcohol or use illicit drugs. family history includes Diabetes in her other; Hypertension in her other. No Known Allergies Current Outpatient Prescriptions on File Prior to Visit  Medication Sig Dispense Refill  . estradiol (ESTRACE) 2 MG tablet Take 2 mg by mouth daily.      . fluticasone (FLONASE) 50 MCG/ACT nasal spray INSTILL 2 SPRAYS INTO THE NOSE DAILY AS DIRECTED  16 g  11  . hyoscyamine (ANASPAZ) 0.125 MG TBDP Place 1 tablet (0.125 mg total) under the tongue every 4 (four) hours as needed.  60 tablet  5  . levothyroxine (SYNTHROID, LEVOTHROID) 150 MCG tablet Take 1  tablet (150 mcg total) by mouth daily.  90 tablet  3  . lisinopril (PRINIVIL,ZESTRIL) 20 MG tablet Take 1 tablet (20 mg total) by mouth daily.  90 tablet  3   No current facility-administered medications on file prior to visit.    Review of Systems  Constitutional: Negative for unexpected weight change, or unusual diaphoresis  HENT: Negative for tinnitus.   Eyes: Negative for photophobia and visual disturbance.  Respiratory: Negative for choking and stridor.   Gastrointestinal: Negative for vomiting and blood in stool.  Genitourinary: Negative for hematuria and decreased urine volume.  Musculoskeletal: Negative for acute joint swelling Skin: Negative for color change and wound.  Neurological: Negative for tremors and numbness other than noted  Psychiatric/Behavioral: Negative for decreased concentration or  hyperactivity.       Objective:   Physical Exam BP 132/86  Pulse 87  Temp(Src) 97.8 F (36.6 C) (Oral)  Ht 5' 6.5" (1.689 m)  Wt 208 lb (94.348 kg)  BMI 33.07 kg/m2  SpO2 97% VS noted, mild ill Constitutional: Pt appears well-developed and well-nourished.  HENT: Head: NCAT.  Right Ear: External ear normal.  Left Ear: External ear normal.  Bilat tm's with mild erythema.  Max sinus areas mild tender.  Pharynx with mild erythema, no exudate Eyes: Conjunctivae and EOM are normal. Pupils are equal, round, and reactive to light.  Neck: Normal range of motion. Neck supple.  Cardiovascular: Normal rate and regular rhythm.   Pulmonary/Chest: Effort  normal and breath sounds normal.  Neurological: Pt is alert. Not confused  Skin: Skin is warm. No erythema.  Psychiatric: Pt behavior is normal. Thought content normal.    Assessment & Plan:

## 2013-02-14 NOTE — Patient Instructions (Signed)
Please take all new medication as prescribed Please continue all other medications as before 

## 2013-02-14 NOTE — Assessment & Plan Note (Signed)
Mild to mod, for antibx course x 14 days,  to f/u any worsening symptoms or concerns

## 2013-02-14 NOTE — Assessment & Plan Note (Signed)
stable overall by history and exam, recent data reviewed with pt, and pt to continue medical treatment as before,  to f/u any worsening symptoms or concerns BP Readings from Last 3 Encounters:  02/14/13 132/86  12/27/12 128/90  04/29/12 110/80

## 2013-02-14 NOTE — Assessment & Plan Note (Signed)
stable overall by history and exam,, and pt to continue medical treatment as before,  to f/u any worsening symptoms or concerns, for med refill

## 2013-02-14 NOTE — Progress Notes (Signed)
Pre-visit discussion using our clinic review tool. No additional management support is needed unless otherwise documented below in the visit note.  

## 2013-04-18 ENCOUNTER — Telehealth: Payer: Self-pay

## 2013-04-18 DIAGNOSIS — Z0000 Encounter for general adult medical examination without abnormal findings: Secondary | ICD-10-CM

## 2013-04-18 NOTE — Telephone Encounter (Signed)
Labs entered.

## 2013-05-31 ENCOUNTER — Ambulatory Visit: Payer: Managed Care, Other (non HMO) | Admitting: Physician Assistant

## 2013-05-31 ENCOUNTER — Encounter (HOSPITAL_COMMUNITY): Payer: Self-pay | Admitting: Emergency Medicine

## 2013-05-31 ENCOUNTER — Emergency Department (HOSPITAL_COMMUNITY): Payer: Managed Care, Other (non HMO)

## 2013-05-31 ENCOUNTER — Emergency Department (HOSPITAL_COMMUNITY)
Admission: EM | Admit: 2013-05-31 | Discharge: 2013-05-31 | Disposition: A | Discharge status: Home / Self Care | Payer: Managed Care, Other (non HMO) | Attending: Emergency Medicine | Admitting: Emergency Medicine

## 2013-05-31 DIAGNOSIS — R630 Anorexia: Secondary | ICD-10-CM | POA: Insufficient documentation

## 2013-05-31 DIAGNOSIS — Z8739 Personal history of other diseases of the musculoskeletal system and connective tissue: Secondary | ICD-10-CM | POA: Insufficient documentation

## 2013-05-31 DIAGNOSIS — R109 Unspecified abdominal pain: Secondary | ICD-10-CM | POA: Insufficient documentation

## 2013-05-31 DIAGNOSIS — Z8709 Personal history of other diseases of the respiratory system: Secondary | ICD-10-CM | POA: Insufficient documentation

## 2013-05-31 DIAGNOSIS — R319 Hematuria, unspecified: Secondary | ICD-10-CM

## 2013-05-31 DIAGNOSIS — E039 Hypothyroidism, unspecified: Secondary | ICD-10-CM | POA: Insufficient documentation

## 2013-05-31 DIAGNOSIS — Z8719 Personal history of other diseases of the digestive system: Secondary | ICD-10-CM | POA: Insufficient documentation

## 2013-05-31 DIAGNOSIS — Z79899 Other long term (current) drug therapy: Secondary | ICD-10-CM | POA: Insufficient documentation

## 2013-05-31 DIAGNOSIS — I10 Essential (primary) hypertension: Secondary | ICD-10-CM | POA: Insufficient documentation

## 2013-05-31 DIAGNOSIS — Z87442 Personal history of urinary calculi: Secondary | ICD-10-CM | POA: Insufficient documentation

## 2013-05-31 DIAGNOSIS — G8929 Other chronic pain: Secondary | ICD-10-CM | POA: Insufficient documentation

## 2013-05-31 DIAGNOSIS — R300 Dysuria: Secondary | ICD-10-CM | POA: Insufficient documentation

## 2013-05-31 DIAGNOSIS — IMO0002 Reserved for concepts with insufficient information to code with codable children: Secondary | ICD-10-CM | POA: Insufficient documentation

## 2013-05-31 LAB — URINALYSIS, ROUTINE W REFLEX MICROSCOPIC
Glucose, UA: NEGATIVE mg/dL
Ketones, ur: NEGATIVE mg/dL
Nitrite: NEGATIVE
Protein, ur: 30 mg/dL — AB
Specific Gravity, Urine: 1.039 — ABNORMAL HIGH (ref 1.005–1.030)
Urobilinogen, UA: 0.2 mg/dL (ref 0.0–1.0)
pH: 5.5 (ref 5.0–8.0)

## 2013-05-31 LAB — COMPREHENSIVE METABOLIC PANEL
ALT: 13 U/L (ref 0–35)
AST: 16 U/L (ref 0–37)
Albumin: 4 g/dL (ref 3.5–5.2)
Alkaline Phosphatase: 130 U/L — ABNORMAL HIGH (ref 39–117)
BUN: 11 mg/dL (ref 6–23)
CO2: 26 mEq/L (ref 19–32)
Calcium: 9.6 mg/dL (ref 8.4–10.5)
Chloride: 98 mEq/L (ref 96–112)
Creatinine, Ser: 0.83 mg/dL (ref 0.50–1.10)
GFR calc Af Amer: 89 mL/min — ABNORMAL LOW (ref >90)
GFR calc non Af Amer: 77 mL/min — ABNORMAL LOW (ref >90)
Glucose, Bld: 111 mg/dL — ABNORMAL HIGH (ref 70–99)
Potassium: 4.2 mEq/L (ref 3.7–5.3)
Sodium: 138 mEq/L (ref 137–147)
Total Bilirubin: 0.2 mg/dL — ABNORMAL LOW (ref 0.3–1.2)
Total Protein: 7.8 g/dL (ref 6.0–8.3)

## 2013-05-31 LAB — CBC WITH DIFFERENTIAL/PLATELET
Basophils Absolute: 0 10*3/uL (ref 0.0–0.1)
Basophils Relative: 0 % (ref 0–1)
Eosinophils Absolute: 0.2 10*3/uL (ref 0.0–0.7)
Eosinophils Relative: 2 % (ref 0–5)
HCT: 40.4 % (ref 36.0–46.0)
Hemoglobin: 13.3 g/dL (ref 12.0–15.0)
Lymphocytes Relative: 19 % (ref 12–46)
Lymphs Abs: 2.1 10*3/uL (ref 0.7–4.0)
MCH: 29.6 pg (ref 26.0–34.0)
MCHC: 32.9 g/dL (ref 30.0–36.0)
MCV: 89.8 fL (ref 78.0–100.0)
Monocytes Absolute: 0.6 10*3/uL (ref 0.1–1.0)
Monocytes Relative: 6 % (ref 3–12)
Neutro Abs: 8 10*3/uL — ABNORMAL HIGH (ref 1.7–7.7)
Neutrophils Relative %: 73 % (ref 43–77)
Platelets: 210 10*3/uL (ref 150–400)
RBC: 4.5 MIL/uL (ref 3.87–5.11)
RDW: 13.3 % (ref 11.5–15.5)
WBC: 11 10*3/uL — ABNORMAL HIGH (ref 4.0–10.5)

## 2013-05-31 LAB — URINE MICROSCOPIC-ADD ON

## 2013-05-31 LAB — LIPASE, BLOOD: Lipase: 15 U/L (ref 11–59)

## 2013-05-31 MED ORDER — KETOROLAC TROMETHAMINE 15 MG/ML IJ SOLN
15.0000 mg | Freq: Once | INTRAMUSCULAR | Status: AC
  Administered 2013-05-31: 15 mg via INTRAVENOUS
  Filled 2013-05-31: qty 1

## 2013-05-31 MED ORDER — SODIUM CHLORIDE 0.9 % IV BOLUS (SEPSIS)
1000.0000 mL | Freq: Once | INTRAVENOUS | Status: AC
  Administered 2013-05-31: 1000 mL via INTRAVENOUS

## 2013-05-31 MED ORDER — FENTANYL CITRATE 0.05 MG/ML IJ SOLN
50.0000 ug | Freq: Once | INTRAMUSCULAR | Status: AC
  Administered 2013-05-31: 50 ug via INTRAVENOUS
  Filled 2013-05-31: qty 2

## 2013-05-31 MED ORDER — HYDROCODONE-ACETAMINOPHEN 5-325 MG PO TABS: 2.0000 | ORAL_TABLET | ORAL | Status: DC | PRN

## 2013-05-31 NOTE — ED Provider Notes (Signed)
CSN: [id number]     Arrival date & time 05/31/13  0507 History   First MD Initiated Contact with Patient 05/31/13 [phone]     Chief Complaint  Patient presents with  . Abdominal Pain     (Consider location/radiation/quality/duration/timing/severity/associated sxs/prior Treatment) HPI Comments: 58 yo female with kidney stone, sinusitis hx presents with urinary frequency and left flank pain worsening since yesterday.  Pt feels more difficult to urinate.  Pain intermittent, radiates to groin.  No AAA hx.  Gradually worsening.   Patient is a 58 y.o. female presenting with abdominal pain. The history is provided by the patient.  Abdominal Pain Associated symptoms: dysuria   Associated symptoms: no chest pain, no chills, no fever, no shortness of breath and no vomiting     Past Medical History  Diagnosis Date  . SINUSITIS- ACUTE-NOS 04/03/2010  . HYPERTENSION 01/10/2007  . HYPOTHYROIDISM 11/02/2006  . HYPERLIPIDEMIA 01/10/2007  . Chronic maxillary sinusitis 10/05/2008  . ALLERGIC RHINITIS 11/02/2006  . GERD 11/02/2006  . NEPHROLITHIASIS, HX OF 01/10/2007  . Chronic neck pain 04/15/2011  . Cervical disc disease 04/15/2011   Past Surgical History  Procedure Laterality Date  . Abdominal hysterectomy  1996  . Tonsillectomy  1961  . S/p selective nerve root block 2002      per Dr. Louanne Skye to right neck   Family History  Problem Relation Age of Onset  . Hypertension Other   . Diabetes Other    History  Substance Use Topics  . Smoking status: Never Smoker   . Smokeless tobacco: Never Used  . Alcohol Use: No   OB History   Grav Para Term Preterm Abortions TAB SAB Ect Mult Living                 Review of Systems  Constitutional: Positive for appetite change. Negative for fever and chills.  HENT: Negative for congestion.   Eyes: Negative for visual disturbance.  Respiratory: Negative for shortness of breath.   Cardiovascular: Negative for chest pain.  Gastrointestinal: Positive for  abdominal pain. Negative for vomiting.  Genitourinary: Positive for dysuria, flank pain and difficulty urinating.  Musculoskeletal: Negative for back pain, neck pain and neck stiffness.  Skin: Negative for rash.  Neurological: Negative for light-headedness and headaches.      Allergies  Review of patient's allergies indicates no known allergies.  Home Medications   Current Outpatient Rx  Name  Route  Sig  Dispense  Refill  . estradiol (ESTRACE) 2 MG tablet   Oral   Take 2 mg by mouth daily.         . fluticasone (FLONASE) 50 MCG/ACT nasal spray   Each Nare   Place 1 spray into both nostrils daily.         Marland Kitchen levothyroxine (SYNTHROID, LEVOTHROID) 150 MCG tablet   Oral   Take 1 tablet (150 mcg total) by mouth daily.   90 tablet   3   . lisinopril (PRINIVIL,ZESTRIL) 20 MG tablet   Oral   Take 1 tablet (20 mg total) by mouth daily.   90 tablet   3    BP 193/88  Pulse 94  Temp(Src) 98.1 F (36.7 C) (Oral)  Resp 16  Ht 5' 6.5" (1.689 m)  Wt 208 lb (94.348 kg)  BMI 33.07 kg/m2  SpO2 98% Physical Exam  Nursing note and vitals reviewed. Constitutional: She is oriented to person, place, and time. She appears well-developed and well-nourished.  HENT:  Head: Normocephalic and atraumatic.  Eyes: Conjunctivae are normal. Right eye exhibits no discharge. Left eye exhibits no discharge.  Neck: Normal range of motion. Neck supple. No tracheal deviation present.  Cardiovascular: Normal rate, regular rhythm and intact distal pulses.   Pulmonary/Chest: Effort normal and breath sounds normal.  Abdominal: Soft. She exhibits no distension. There is no tenderness. There is no guarding.  Musculoskeletal: She exhibits tenderness (mild left flank). She exhibits no edema.  Neurological: She is alert and oriented to person, place, and time.  Skin: Skin is warm. No rash noted.  Psychiatric: She has a normal mood and affect.    ED Course  Procedures (including critical care  time) EMERGENCY DEPARTMENT ULTRASOUND  Study: Limited Retroperitoneal Ultrasound of the Abdominal Aorta.  INDICATIONS:Abdominal pain and Age>55 Multiple views of the abdominal aorta were obtained in real-time from the diaphragmatic hiatus to the aortic bifurcation in transverse planes with a multi-frequency probe. PERFORMED BY: Myself IMAGES ARCHIVED?: Yes FINDINGS: Maximum aortic dimensions are 1.9 cm LIMITATIONS:  Bowel gas INTERPRETATION:  No abdominal aortic aneurysm   Emergency Focused Ultrasound Exam Limited retroperitoneal ultrasound of kidneys  Performed and interpreted by Dr. Reather Converse Indication: flank pain Focused abdominal ultrasound with both kidneys imaged in transverse and longitudinal planes in real-time. Interpretation: Moderate left hydronephrosis visualized.  No stones or cysts visualized  Images archived electronically  Labs Review Labs Reviewed  CBC WITH DIFFERENTIAL - Abnormal; Notable for the following:    WBC 11.0 (*)    Neutro Abs 8.0 (*)    All other components within normal limits  COMPREHENSIVE METABOLIC PANEL - Abnormal; Notable for the following:    Glucose, Bld 111 (*)    Alkaline Phosphatase 130 (*)    Total Bilirubin 0.2 (*)    GFR calc non Af Amer 77 (*)    GFR calc Af Amer 89 (*)    All other components within normal limits  URINALYSIS, ROUTINE W REFLEX MICROSCOPIC - Abnormal; Notable for the following:    Color, Urine AMBER (*)    APPearance CLOUDY (*)    Specific Gravity, Urine 1.039 (*)    Hgb urine dipstick LARGE (*)    Bilirubin Urine SMALL (*)    Protein, ur 30 (*)    Leukocytes, UA TRACE (*)    All other components within normal limits  URINE MICROSCOPIC-ADD ON - Abnormal; Notable for the following:    Squamous Epithelial / LPF FEW (*)    All other components within normal limits  LIPASE, BLOOD   Imaging Review Dg Abd 1 View  05/31/2013   CLINICAL DATA:  Left lower quadrant pain for 2 days.  EXAM: ABDOMEN - 1 VIEW  COMPARISON:   CT ABD W/CM dated 03/11/2005  FINDINGS: The bowel gas pattern is normal. Mild amount of retained large bowel stool. No radio-opaque calculi or other significant radiographic abnormality are seen.  IMPRESSION: Mild amount of retained large bowel stool, no bowel obstruction.   Electronically Signed   By: Elon Alas   On: 05/31/2013 06:37    EKG Interpretation   None       MDM   Final diagnoses:  Left flank pain  Hematuria   Clinically kidney stone vs pyelonephritis.   Bedside US normal diameter aorta abdominal, renal showed mild- moderate left hydronephrosis. KUB, pain meds, fluids given.  Pain significantly improved on recheck.  Pt okay holding on CT, will fup with urology.  Results and differential diagnosis were discussed with the patient. Close follow up outpatient was discussed, patient comfortable  with the plan.   Diagnosis: Left flank pain, hematuria, likely nephrolithiasis     Mariea Clonts, MD 05/31/13 [phone]

## 2013-05-31 NOTE — Discharge Instructions (Signed)
Strain urine. Take ibuprofen for pain, For severe pain take norco or vicodin however realize they have the potential for addiction and it can make you sleepy and has tylenol in it.  No operating machinery while taking. If you were given medicines take as directed.  If you are on coumadin or contraceptives realize their levels and effectiveness is altered by many different medicines.  If you have any reaction (rash, tongues swelling, other) to the medicines stop taking and see a physician.   Please follow up as directed and return to the ER or see a physician for new or worsening symptoms (fever, uncontrollable pain, other symptoms).  Thank you.

## 2013-05-31 NOTE — ED Notes (Signed)
Pt reports that she began having lower abdominal pain yesterday with urinary frequency and urgency with oliguria. Pt reports at 0200 she awoke to L flank pain and was unable to rest. Pt reports her LBM was yesterday and appeared to have streaks of blood in it.  Pt a&o x4, ambulatory to triage.

## 2013-06-02 LAB — URINE CULTURE: Colony Count: 75000

## 2013-06-06 ENCOUNTER — Encounter: Payer: Self-pay | Admitting: Internal Medicine

## 2013-06-06 ENCOUNTER — Ambulatory Visit (INDEPENDENT_AMBULATORY_CARE_PROVIDER_SITE_OTHER): Payer: Managed Care, Other (non HMO) | Admitting: Internal Medicine

## 2013-06-06 VITALS — BP 150/78 | HR 99 | Temp 97.8°F | Ht 66.0 in | Wt 213.5 lb

## 2013-06-06 DIAGNOSIS — N200 Calculus of kidney: Secondary | ICD-10-CM | POA: Insufficient documentation

## 2013-06-06 DIAGNOSIS — J209 Acute bronchitis, unspecified: Secondary | ICD-10-CM | POA: Insufficient documentation

## 2013-06-06 DIAGNOSIS — I10 Essential (primary) hypertension: Secondary | ICD-10-CM

## 2013-06-06 DIAGNOSIS — J320 Chronic maxillary sinusitis: Secondary | ICD-10-CM

## 2013-06-06 MED ORDER — HYDROCODONE-ACETAMINOPHEN 5-325 MG PO TABS: 1.0000 | ORAL_TABLET | Freq: Every day | ORAL | Status: DC | PRN

## 2013-06-06 MED ORDER — LEVOFLOXACIN 500 MG PO TABS: 500.0000 mg | ORAL_TABLET | Freq: Every day | ORAL | Status: DC

## 2013-06-06 MED ORDER — HYDROCODONE-HOMATROPINE 5-1.5 MG/5ML PO SYRP: 5.0000 mL | ORAL_SOLUTION | Freq: Four times a day (QID) | ORAL | Status: DC | PRN

## 2013-06-06 MED ORDER — LISINOPRIL 40 MG PO TABS: 40.0000 mg | ORAL_TABLET | Freq: Every day | ORAL | Status: DC

## 2013-06-06 NOTE — Addendum Note (Signed)
Addended by: Biagio Borg on: 06/06/2013 03:33 PM   Modules accepted: Orders

## 2013-06-06 NOTE — Assessment & Plan Note (Signed)
Apparently resolved,  to f/u any worsening symptoms or concerns

## 2013-06-06 NOTE — Patient Instructions (Signed)
Please take all new medication as prescribed  OK to stop the flomax generic from the ER  OK to increase the lisinopril to 40 mg per day  Please continue all other medications as before, and refills have been done if requested. Please have the pharmacy call with any other refills you may need.  Please remember to sign up for MyChart if you have not done so, as this will be important to you in the future with finding out test results, communicating by private email, and scheduling acute appointments online when needed.  Please keep your appointments with your specialists as you have planned - urology

## 2013-06-06 NOTE — Assessment & Plan Note (Signed)
Mild to mod, for antibx course,  to f/u any worsening symptoms or concerns 

## 2013-06-06 NOTE — Progress Notes (Signed)
   Subjective:    Patient ID: [name], female    DOB: [birth year], 58 y.o.   MRN: [id number]  HPI Here with acute onset mild to mod 2-3 days ST, HA, general weakness and malaise, with prod cough greenish sputum, but Pt denies chest pain, increased sob or doe, wheezing, orthopnea, PND, increased LE swelling, palpitations, dizziness or syncope. Also with bilat sinus pain as well.  Had recent hematuria and prob stone, as she passed some sand like material with straining since seen at ER then urology feb 25 (same day).  BP has been elevated somewhat, hard to lose wt, gained about 6 lbs in the past yr Past Medical History  Diagnosis Date  . SINUSITIS- ACUTE-NOS 04/03/2010  . HYPERTENSION 01/10/2007  . HYPOTHYROIDISM 11/02/2006  . HYPERLIPIDEMIA 01/10/2007  . Chronic maxillary sinusitis 10/05/2008  . ALLERGIC RHINITIS 11/02/2006  . GERD 11/02/2006  . NEPHROLITHIASIS, HX OF 01/10/2007  . Chronic neck pain 04/15/2011  . Cervical disc disease 04/15/2011   Past Surgical History  Procedure Laterality Date  . Abdominal hysterectomy  1996  . Tonsillectomy  1961  . S/p selective nerve root block 2002      per Dr. Louanne Skye to right neck    reports that she has never smoked. She has never used smokeless tobacco. She reports that she does not drink alcohol or use illicit drugs. family history includes Diabetes in her other; Hypertension in her other. No Known Allergies Current Outpatient Prescriptions on File Prior to Visit  Medication Sig Dispense Refill  . estradiol (ESTRACE) 2 MG tablet Take 2 mg by mouth daily.      . fluticasone (FLONASE) 50 MCG/ACT nasal spray Place 1 spray into both nostrils daily.      Marland Kitchen levothyroxine (SYNTHROID, LEVOTHROID) 150 MCG tablet Take 1 tablet (150 mcg total) by mouth daily.  90 tablet  3   No current facility-administered medications on file prior to visit.   Review of Systems  Constitutional: Negative for unexpected weight change, or unusual diaphoresis  HENT: Negative  for tinnitus.   Eyes: Negative for photophobia and visual disturbance.  Respiratory: Negative for choking and stridor.   Gastrointestinal: Negative for vomiting and blood in stool.  Genitourinary: Negative for hematuria and decreased urine volume.  Musculoskeletal: Negative for acute joint swelling Skin: Negative for color change and wound.  Neurological: Negative for tremors and numbness other than noted  Psychiatric/Behavioral: Negative for decreased concentration or  hyperactivity.       Objective:   Physical Exam BP 150/78  Pulse 99  Temp(Src) 97.8 F (36.6 C) (Oral)  Ht 5\' 6"  (1.676 m)  Wt 213 lb 8 oz (96.843 kg)  BMI 34.48 kg/m2  SpO2 98% VS noted, mild ill Constitutional: Pt appears well-developed and well-nourished.  HENT: Head: NCAT.  Right Ear: External ear normal.  Left Ear: External ear normal.  Bilat tm's with mild erythema.  Max sinus areas mild tender.  Pharynx with mild erythema, no exudate Eyes: Conjunctivae and EOM are normal. Pupils are equal, round, and reactive to light.  Neck: Normal range of motion. Neck supple.  Cardiovascular: Normal rate and regular rhythm.   Pulmonary/Chest: Effort normal and breath sounds  - no rales or wheezing.  Neurological: Pt is alert. Not confused  Skin: Skin is warm. No erythema.  Psychiatric: Pt behavior is normal. Thought content normal.     Assessment & Plan:

## 2013-06-06 NOTE — Assessment & Plan Note (Signed)
With mild flare again, for tx as above

## 2013-06-06 NOTE — Assessment & Plan Note (Signed)
Mild uncontrolled, to incr the lisionpril to 40 qd

## 2013-06-06 NOTE — Progress Notes (Signed)
Pre visit review using our clinic review tool, if applicable. No additional management support is needed unless otherwise documented below in the visit note. 

## 2013-06-22 ENCOUNTER — Other Ambulatory Visit: Payer: Self-pay | Admitting: Internal Medicine

## 2013-06-30 ENCOUNTER — Encounter: Payer: Self-pay | Admitting: Internal Medicine

## 2013-06-30 ENCOUNTER — Other Ambulatory Visit (INDEPENDENT_AMBULATORY_CARE_PROVIDER_SITE_OTHER): Payer: Managed Care, Other (non HMO)

## 2013-06-30 DIAGNOSIS — Z0000 Encounter for general adult medical examination without abnormal findings: Secondary | ICD-10-CM

## 2013-06-30 LAB — CBC WITH DIFFERENTIAL/PLATELET
Basophils Absolute: 0 10*3/uL (ref 0.0–0.1)
Basophils Relative: 0.6 % (ref 0.0–3.0)
Eosinophils Absolute: 0.2 10*3/uL (ref 0.0–0.7)
Eosinophils Relative: 2.5 % (ref 0.0–5.0)
HCT: 36.1 % (ref 36.0–46.0)
Hemoglobin: 12.3 g/dL (ref 12.0–15.0)
Lymphocytes Relative: 35 % (ref 12.0–46.0)
Lymphs Abs: 2.4 10*3/uL (ref 0.7–4.0)
MCHC: 34 g/dL (ref 30.0–36.0)
MCV: 87.8 fl (ref 78.0–100.0)
Monocytes Absolute: 0.5 10*3/uL (ref 0.1–1.0)
Monocytes Relative: 6.7 % (ref 3.0–12.0)
Neutro Abs: 3.7 10*3/uL (ref 1.4–7.7)
Neutrophils Relative %: 55.2 % (ref 43.0–77.0)
Platelets: 208 10*3/uL (ref 150.0–400.0)
RBC: 4.12 Mil/uL (ref 3.87–5.11)
RDW: 13.8 % (ref 11.5–14.6)
WBC: 6.8 10*3/uL (ref 4.5–10.5)

## 2013-06-30 LAB — HEPATIC FUNCTION PANEL
ALT: 14 U/L (ref 0–35)
AST: 14 U/L (ref 0–37)
Albumin: 3.9 g/dL (ref 3.5–5.2)
Alkaline Phosphatase: 84 U/L (ref 39–117)
Bilirubin, Direct: 0 mg/dL (ref 0.0–0.3)
Total Bilirubin: 0.4 mg/dL (ref 0.3–1.2)
Total Protein: 6.9 g/dL (ref 6.0–8.3)

## 2013-06-30 LAB — URINALYSIS, ROUTINE W REFLEX MICROSCOPIC
Bilirubin Urine: NEGATIVE
Hgb urine dipstick: NEGATIVE
Ketones, ur: NEGATIVE
Leukocytes, UA: NEGATIVE
Nitrite: NEGATIVE
RBC / HPF: NONE SEEN (ref 0–?)
Specific Gravity, Urine: 1.025 (ref 1.000–1.030)
Total Protein, Urine: NEGATIVE
Urine Glucose: NEGATIVE
Urobilinogen, UA: 0.2 (ref 0.0–1.0)
pH: 6 (ref 5.0–8.0)

## 2013-06-30 LAB — BASIC METABOLIC PANEL
BUN: 14 mg/dL (ref 6–23)
CO2: 28 mEq/L (ref 19–32)
Calcium: 9.5 mg/dL (ref 8.4–10.5)
Chloride: 103 mEq/L (ref 96–112)
Creatinine, Ser: 0.7 mg/dL (ref 0.4–1.2)
GFR: 88.45 mL/min (ref >60.00)
Glucose, Bld: 87 mg/dL (ref 70–99)
Potassium: 4.5 mEq/L (ref 3.5–5.1)
Sodium: 137 mEq/L (ref 135–145)

## 2013-06-30 LAB — LIPID PANEL
Cholesterol: 197 mg/dL (ref 0–200)
HDL: 58.8 mg/dL (ref >39.00)
LDL Cholesterol: 82 mg/dL (ref 0–99)
Total CHOL/HDL Ratio: 3
Triglycerides: 279 mg/dL — ABNORMAL HIGH (ref 0.0–149.0)
VLDL: 55.8 mg/dL — ABNORMAL HIGH (ref 0.0–40.0)

## 2013-06-30 LAB — TSH: TSH: 1.65 u[IU]/mL (ref 0.35–5.50)

## 2013-07-06 ENCOUNTER — Ambulatory Visit (INDEPENDENT_AMBULATORY_CARE_PROVIDER_SITE_OTHER): Payer: Managed Care, Other (non HMO) | Admitting: Internal Medicine

## 2013-07-06 ENCOUNTER — Encounter: Payer: Self-pay | Admitting: Internal Medicine

## 2013-07-06 VITALS — BP 150/70 | HR 76 | Temp 97.8°F | Wt 212.2 lb

## 2013-07-06 DIAGNOSIS — I10 Essential (primary) hypertension: Secondary | ICD-10-CM

## 2013-07-06 DIAGNOSIS — M542 Cervicalgia: Secondary | ICD-10-CM

## 2013-07-06 DIAGNOSIS — G8929 Other chronic pain: Secondary | ICD-10-CM

## 2013-07-06 DIAGNOSIS — Z0000 Encounter for general adult medical examination without abnormal findings: Secondary | ICD-10-CM

## 2013-07-06 MED ORDER — HYDROCODONE-ACETAMINOPHEN 5-325 MG PO TABS: 1.0000 | ORAL_TABLET | Freq: Every day | ORAL | Status: DC | PRN

## 2013-07-06 MED ORDER — LISINOPRIL 40 MG PO TABS: 40.0000 mg | ORAL_TABLET | Freq: Every day | ORAL | Status: DC

## 2013-07-06 MED ORDER — FLUTICASONE PROPIONATE 50 MCG/ACT NA SUSP: 1.0000 | Freq: Every day | NASAL | Status: DC

## 2013-07-06 MED ORDER — LEVOTHYROXINE SODIUM 150 MCG PO TABS: 150.0000 ug | ORAL_TABLET | Freq: Every day | ORAL | Status: DC

## 2013-07-06 NOTE — Progress Notes (Signed)
Subjective:    Patient ID: [name], female    DOB: Aug 22, 1955, 58 y.o.   MRN: [id number]  HPI  Here for wellness and f/u;  Overall doing ok;  Pt denies CP, worsening SOB, DOE, wheezing, orthopnea, PND, worsening LE edema, palpitations, dizziness or syncope.  Pt denies neurological change such as new headache, facial or extremity weakness.  Pt denies polydipsia, polyuria, or low sugar symptoms. Pt states overall good compliance with treatment and medications, good tolerability, and has been trying to follow lower cholesterol diet.  Pt denies worsening depressive symptoms, suicidal ideation or panic. No fever, night sweats, wt loss, loss of appetite, or other constitutional symptoms.  Pt states good ability with ADL's, has low fall risk, home safety reviewed and adequate, no other significant changes in hearing or vision, and only occasionally active with exercise.  No current complaints Past Medical History  Diagnosis Date  . SINUSITIS- ACUTE-NOS 04/03/2010  . HYPERTENSION 01/10/2007  . HYPOTHYROIDISM 11/02/2006  . HYPERLIPIDEMIA 01/10/2007  . Chronic maxillary sinusitis 10/05/2008  . ALLERGIC RHINITIS 11/02/2006  . GERD 11/02/2006  . NEPHROLITHIASIS, HX OF 01/10/2007  . Chronic neck pain 04/15/2011  . Cervical disc disease 04/15/2011   Past Surgical History  Procedure Laterality Date  . Abdominal hysterectomy  1996  . Tonsillectomy  1961  . S/p selective nerve root block 2002      per Dr. Louanne Skye to right neck    reports that she has never smoked. She has never used smokeless tobacco. She reports that she does not drink alcohol or use illicit drugs. family history includes Diabetes in her other; Hypertension in her other. No Known Allergies Current Outpatient Prescriptions on File Prior to Visit  Medication Sig Dispense Refill  . estradiol (ESTRACE) 2 MG tablet Take 2 mg by mouth daily.       No current facility-administered medications on file prior to visit.   Review of  Systems Constitutional: Negative for diaphoresis, activity change, appetite change or unexpected weight change.  HENT: Negative for hearing loss, ear pain, facial swelling, mouth sores and neck stiffness.   Eyes: Negative for pain, redness and visual disturbance.  Respiratory: Negative for shortness of breath and wheezing.   Cardiovascular: Negative for chest pain and palpitations.  Gastrointestinal: Negative for diarrhea, blood in stool, abdominal distention or other pain Genitourinary: Negative for hematuria, flank pain or change in urine volume.  Musculoskeletal: Negative for myalgias and joint swelling.  Skin: Negative for color change and wound.  Neurological: Negative for syncope and numbness. other than noted Hematological: Negative for adenopathy.  Psychiatric/Behavioral: Negative for hallucinations, self-injury, decreased concentration and agitation.      Objective:   Physical Exam BP 150/70  Pulse 76  Temp(Src) 97.8 F (36.6 C) (Oral)  Wt 212 lb 4 oz (96.276 kg)  SpO2 96% VS noted,  Constitutional: Pt is oriented to person, place, and time. Appears well-developed and well-nourished.  Head: Normocephalic and atraumatic.  Right Ear: External ear normal.  Left Ear: External ear normal.  Nose: Nose normal.  Mouth/Throat: Oropharynx is clear and moist.  Eyes: Conjunctivae and EOM are normal. Pupils are equal, round, and reactive to light.  Neck: Normal range of motion. Neck supple. No JVD present. No tracheal deviation present.  Cardiovascular: Normal rate, regular rhythm, normal heart sounds and intact distal pulses.   Pulmonary/Chest: Effort normal and breath sounds normal.  Abdominal: Soft. Bowel sounds are normal. There is no tenderness. No HSM  Musculoskeletal: Normal range of motion.  Exhibits no edema.  Lymphadenopathy:  Has no cervical adenopathy.  Neurological: Pt is alert and oriented to person, place, and time. Pt has normal reflexes. No cranial nerve deficit.   Skin: Skin is warm and dry. No rash noted.  Psychiatric:  Has  normal mood and affect. Behavior is normal.      Assessment & Plan:

## 2013-07-06 NOTE — Patient Instructions (Addendum)
Please continue all other medications as before, and refills have been done if requested. Please have the pharmacy call with any other refills you may need.  Please continue your efforts at being more active, low cholesterol diet, and weight control. You are otherwise up to date with prevention measures today.  Please keep your appointments with your specialists as you have planned  Please remember to sign up for MyChart if you have not done so, as this will be important to you in the future with finding out test results, communicating by private email, and scheduling acute appointments online when needed.  Please remember to check your blood pressure on a regular basis, with the goal being to be less than 140/90  Please return in 1 year for your yearly visit, or sooner if needed, with Lab testing done 3-5 days before

## 2013-07-06 NOTE — Progress Notes (Signed)
Pre visit review using our clinic review tool, if applicable. No additional management support is needed unless otherwise documented below in the visit note. 

## 2013-07-06 NOTE — Assessment & Plan Note (Signed)
For med refill asd,  to f/u any worsening symptoms or concerns

## 2013-07-06 NOTE — Assessment & Plan Note (Signed)
Mild elev today, but did not take BP med this am, to cont same meds, f/u bp at home and next visit.  to f/u any worsening symptoms or concerns BP Readings from Last 3 Encounters:  07/06/13 150/70  06/06/13 150/78  05/31/13 154/75

## 2013-07-06 NOTE — Assessment & Plan Note (Signed)

## 2013-07-10 ENCOUNTER — Telehealth: Payer: Self-pay | Admitting: Internal Medicine

## 2013-07-10 NOTE — Telephone Encounter (Signed)
Relevant patient education assigned to patient using Emmi. ° °

## 2013-08-04 ENCOUNTER — Ambulatory Visit (INDEPENDENT_AMBULATORY_CARE_PROVIDER_SITE_OTHER)
Admission: RE | Admit: 2013-08-04 | Discharge: 2013-08-04 | Disposition: A | Discharge status: Home / Self Care | Payer: Managed Care, Other (non HMO) | Source: Ambulatory Visit | Attending: Physician Assistant | Admitting: Physician Assistant

## 2013-08-04 ENCOUNTER — Encounter: Payer: Self-pay | Admitting: Physician Assistant

## 2013-08-04 ENCOUNTER — Ambulatory Visit (INDEPENDENT_AMBULATORY_CARE_PROVIDER_SITE_OTHER): Payer: Managed Care, Other (non HMO) | Admitting: Physician Assistant

## 2013-08-04 VITALS — BP 128/80 | HR 107 | Temp 98.4°F | Resp 18 | Wt 212.0 lb

## 2013-08-04 DIAGNOSIS — J209 Acute bronchitis, unspecified: Secondary | ICD-10-CM

## 2013-08-04 DIAGNOSIS — J019 Acute sinusitis, unspecified: Secondary | ICD-10-CM

## 2013-08-04 MED ORDER — HYDROCODONE-HOMATROPINE 5-1.5 MG/5ML PO SYRP: 5.0000 mL | ORAL_SOLUTION | Freq: Four times a day (QID) | ORAL | Status: DC | PRN

## 2013-08-04 MED ORDER — MOXIFLOXACIN HCL 400 MG PO TABS: 400.0000 mg | ORAL_TABLET | Freq: Every day | ORAL | Status: DC

## 2013-08-04 NOTE — Patient Instructions (Addendum)
You will have an x-ray done today at the [facility] facility. We will call with the results as soon as they are available.  We have sent in a prescription for moxifloxacin which she will take daily at the same time of day for the next 10 days for your infection.  You cannot drive while taking the cough syrup as it can inhibit your ability to operate a vehicle.  Stay hydrated. Drink plenty of clear fluids.  Followup if symptoms fail to improve or worsen despite treatment.  Sinusitis Sinusitis is redness, soreness, and puffiness (inflammation) of the air pockets in the bones of your face (sinuses). The redness, soreness, and puffiness can cause air and mucus to get trapped in your sinuses. This can allow germs to grow and cause an infection.  HOME CARE   Drink enough fluids to keep your pee (urine) clear or pale yellow.  Use a humidifier in your home.  Run a hot shower to create steam in the bathroom. Sit in the bathroom with the door closed. Breathe in the steam 3 4 times a day.  Put a warm, moist washcloth on your face 3 4 times a day, or as told by your doctor.  Use salt water sprays (saline sprays) to wet the thick fluid in your nose. This can help the sinuses drain.  Only take medicine as told by your doctor. GET HELP RIGHT AWAY IF:   Your pain gets worse.  You have very bad headaches.  You are sick to your stomach (nauseous).  You throw up (vomit).  You are very sleepy (drowsy) all the time.  Your face is puffy (swollen).  Your vision changes.  You have a stiff neck.  You have trouble breathing. MAKE SURE YOU:   Understand these instructions.  Will watch your condition.  Will get help right away if you are not doing well or get worse. Document Released: 09/09/2007 Document Revised: 12/16/2011 Document Reviewed: 10/27/2011 [facility] Patient Information 2014 [employer].

## 2013-08-04 NOTE — Progress Notes (Signed)
Pre visit review using our clinic review tool, if applicable. No additional management support is needed unless otherwise documented below in the visit note. 

## 2013-08-04 NOTE — Progress Notes (Signed)
Subjective:    Patient ID: [name], female    DOB: 1955/05/04, 58 y.o.   MRN: [id number]  Cough This is a new problem. The current episode started 1 to 4 weeks ago. The problem has been gradually worsening. The problem occurs constantly. The cough is productive of sputum (yellow sputum). Associated symptoms include chills, ear congestion, a fever, headaches, myalgias, nasal congestion, postnasal drip, a sore throat, shortness of breath, sweats and wheezing. Pertinent negatives include no chest pain, ear pain, heartburn, hemoptysis, rash, rhinorrhea or weight loss. Nothing aggravates the symptoms. Treatments tried: Mucinex D, DayQuil, and NyQuil. The treatment provided no relief.  Patient was seen 06/06/2013 for similar symptoms, was diagnosed with sinusitis and bronchitis and treated with Levaquin. Patient states that her symptoms resolved at that time, however she is concerned at how quickly she became sick with the same symptoms again.   Review of Systems  Constitutional: Positive for fever and chills. Negative for weight loss.  HENT: Positive for postnasal drip and sore throat. Negative for ear pain and rhinorrhea.   Respiratory: Positive for cough, shortness of breath and wheezing. Negative for apnea, hemoptysis, choking and chest tightness.   Cardiovascular: Negative for chest pain, palpitations and leg swelling.  Gastrointestinal: Negative for heartburn, nausea, vomiting and diarrhea.  Musculoskeletal: Positive for myalgias.  Skin: Negative for rash.  Neurological: Positive for headaches.  All other systems reviewed and are negative.   Past Medical History  Diagnosis Date  . SINUSITIS- ACUTE-NOS 04/03/2010  . HYPERTENSION 01/10/2007  . HYPOTHYROIDISM 11/02/2006  . HYPERLIPIDEMIA 01/10/2007  . Chronic maxillary sinusitis 10/05/2008  . ALLERGIC RHINITIS 11/02/2006  . GERD 11/02/2006  . NEPHROLITHIASIS, HX OF 01/10/2007  . Chronic neck pain 04/15/2011  . Cervical disc disease  04/15/2011   Past Surgical History  Procedure Laterality Date  . Abdominal hysterectomy  1996  . Tonsillectomy  1961  . S/p selective nerve root block 2002      per Dr. Louanne Skye to right neck    reports that she has never smoked. She has never used smokeless tobacco. She reports that she does not drink alcohol or use illicit drugs. family history includes Diabetes in her other; Hypertension in her other. No Known Allergies     Objective:   Physical Exam  Nursing note and vitals reviewed. Constitutional: She is oriented to person, place, and time. She appears well-developed and well-nourished. No distress.  HENT:  Head: Normocephalic and atraumatic.  Right Ear: External ear normal.  Left Ear: External ear normal.  Nose: Nose normal.  Oropharynx slightly erythematous, no exudate.  Bilateral tympanic membranes are bulgy, otherwise normal in appearance.  Left maxillary sinus exquisitely tender to palpation. Right maxillary sinus nontender to palpation. Bilateral frontal sinuses mildly tender to palpation.  Eyes: Conjunctivae and EOM are normal. Pupils are equal, round, and reactive to light. No scleral icterus.  Neck: Normal range of motion. Neck supple. No JVD present. No tracheal deviation present. No thyromegaly present.  Cardiovascular: Normal rate, regular rhythm, normal heart sounds and intact distal pulses.  Exam reveals no gallop and no friction rub.   No murmur heard. Pulmonary/Chest: Effort normal. No stridor. No respiratory distress. She has no wheezes. She has no rales. She exhibits no tenderness.  Rhonchi heard on auscultation of the bilateral lung bases.  Abdominal: Soft. Bowel sounds are normal. There is no tenderness.  Musculoskeletal: Normal range of motion. She exhibits no edema.  Lymphadenopathy:    She has no cervical adenopathy.  Neurological: She is alert and oriented to person, place, and time. She has normal reflexes. No cranial nerve deficit. Coordination normal.   Skin: Skin is warm and dry. No rash noted. She is not diaphoretic. No erythema. No pallor.  Psychiatric: She has a normal mood and affect. Her behavior is normal. Judgment and thought content normal.   Filed Vitals:   08/04/13 1057  BP: 128/80  Pulse: 107  Temp: 98.4 F (36.9 C)  Resp: 18   SpO2: 96% on RA.  (08/04/2013)  Lab Results  Component Value Date   WBC 6.8 06/30/2013   HGB 12.3 06/30/2013   HCT 36.1 06/30/2013   PLT 208.0 06/30/2013   GLUCOSE 87 06/30/2013   CHOL 197 06/30/2013   TRIG 279.0* 06/30/2013   HDL 58.80 06/30/2013   LDLDIRECT 114.4 04/09/2011   LDLCALC 82 06/30/2013   ALT 14 06/30/2013   AST 14 06/30/2013   NA 137 06/30/2013   K 4.5 06/30/2013   CL 103 06/30/2013   CREATININE 0.7 06/30/2013   BUN 14 06/30/2013   CO2 28 06/30/2013   TSH 1.65 06/30/2013      Assessment & Plan:  [name] was seen today for cough.  Diagnoses and associated orders for this visit:  Acute sinusitis - moxifloxacin (AVELOX) 400 MG tablet; Take 1 tablet (400 mg total) by mouth daily at 8 pm.  Acute bronchitis - moxifloxacin (AVELOX) 400 MG tablet; Take 1 tablet (400 mg total) by mouth daily at 8 pm. - DG Chest 2 View; Future - HYDROcodone-homatropine (HYCODAN) 5-1.5 MG/5ML syrup; Take 5 mLs by mouth every 6 (six) hours as needed for cough.    Patient Instructions  You will have an x-ray done today at the [facility] facility. We will call with the results as soon as they are available.  We have sent in a prescription for moxifloxacin which she will take daily at the same time of day for the next 10 days for your infection.  You cannot drive while taking the cough syrup as it can inhibit your ability to operate a vehicle.  Stay hydrated. Drink plenty of clear fluids.  Followup if symptoms fail to improve or worsen despite treatment.

## 2014-01-22 ENCOUNTER — Telehealth: Payer: Self-pay

## 2014-01-22 MED ORDER — HYOSCYAMINE SULFATE 0.125 MG SL SUBL: 0.1250 mg | SUBLINGUAL_TABLET | SUBLINGUAL | Status: DC | PRN

## 2014-01-22 NOTE — Telephone Encounter (Signed)
Received a fax from [facility] in [place] for a refill request on Hyoscyamine 0.125 mg #60 directions to place 1 tablet under the tongue every 4 hours as needed. I do not see this on present med list. Please advise.

## 2014-01-22 NOTE — Telephone Encounter (Signed)
done erx 

## 2014-03-22 ENCOUNTER — Ambulatory Visit (INDEPENDENT_AMBULATORY_CARE_PROVIDER_SITE_OTHER): Payer: Managed Care, Other (non HMO) | Admitting: Internal Medicine

## 2014-03-22 ENCOUNTER — Telehealth: Payer: Self-pay | Admitting: *Deleted

## 2014-03-22 ENCOUNTER — Encounter: Payer: Self-pay | Admitting: Internal Medicine

## 2014-03-22 VITALS — BP 140/80 | HR 102 | Temp 98.0°F | Ht 66.5 in | Wt 212.2 lb

## 2014-03-22 DIAGNOSIS — I10 Essential (primary) hypertension: Secondary | ICD-10-CM

## 2014-03-22 DIAGNOSIS — M545 Low back pain, unspecified: Secondary | ICD-10-CM

## 2014-03-22 MED ORDER — CYCLOBENZAPRINE HCL 5 MG PO TABS: 5.0000 mg | ORAL_TABLET | Freq: Three times a day (TID) | ORAL | Status: DC | PRN

## 2014-03-22 MED ORDER — HYDROCODONE-ACETAMINOPHEN 5-325 MG PO TABS: 1.0000 | ORAL_TABLET | Freq: Every day | ORAL | Status: DC | PRN

## 2014-03-22 NOTE — Patient Instructions (Signed)
Please take all new medication as prescribed  Please continue all other medications as before, and refills have been done if requested.  Please have the pharmacy call with any other refills you may need.  Please keep your appointments with your specialists as you may have planned     

## 2014-03-22 NOTE — Progress Notes (Signed)
Subjective:    Patient ID: [name], female    DOB: [birth year], 57 y.o.   MRN: [id number]  HPI  Here acute onset left LBP, sharp,  without  bowel or bladder change, fever, wt loss,  worsening LE pain/numbness/weakness, gait change or falls, all for 1 wk with twinges and flares. Pt denies chest pain, increased sob or doe, wheezing, orthopnea, PND, increased LE swelling, palpitations, dizziness or syncope.  Pt denies new neurological symptoms such as new headache, or facial or extremity weakness or numbness.   Pt denies fever, wt loss, night sweats, loss of appetite, or other constitutional symptoms Past Medical History  Diagnosis Date  . SINUSITIS- ACUTE-NOS 04/03/2010  . HYPERTENSION 01/10/2007  . HYPOTHYROIDISM 11/02/2006  . HYPERLIPIDEMIA 01/10/2007  . Chronic maxillary sinusitis 10/05/2008  . ALLERGIC RHINITIS 11/02/2006  . GERD 11/02/2006  . NEPHROLITHIASIS, HX OF 01/10/2007  . Chronic neck pain 04/15/2011  . Cervical disc disease 04/15/2011   Past Surgical History  Procedure Laterality Date  . Abdominal hysterectomy  1996  . Tonsillectomy  1961  . S/p selective nerve root block 2002      per Dr. Louanne Skye to right neck    reports that she has never smoked. She has never used smokeless tobacco. She reports that she does not drink alcohol or use illicit drugs. family history includes Diabetes in her other; Hypertension in her other. No Known Allergies Current Outpatient Prescriptions on File Prior to Visit  Medication Sig Dispense Refill  . estradiol (ESTRACE) 2 MG tablet Take 2 mg by mouth daily.    . fluticasone (FLONASE) 50 MCG/ACT nasal spray Place 1 spray into both nostrils daily. 16 g 11  . hyoscyamine (LEVSIN SL) 0.125 MG SL tablet Place 1 tablet (0.125 mg total) under the tongue every 4 (four) hours as needed. 90 tablet 1  . levothyroxine (SYNTHROID, LEVOTHROID) 150 MCG tablet Take 1 tablet (150 mcg total) by mouth daily before breakfast. 90 tablet 3  . lisinopril  (PRINIVIL,ZESTRIL) 40 MG tablet Take 1 tablet (40 mg total) by mouth daily. 90 tablet 3   No current facility-administered medications on file prior to visit.   Review of Systems  Constitutional: Negative for unusual diaphoresis or other sweats  HENT: Negative for ringing in ear Eyes: Negative for double vision or worsening visual disturbance.  Respiratory: Negative for choking and stridor.   Gastrointestinal: Negative for vomiting or other signifcant bowel change Genitourinary: Negative for hematuria or decreased urine volume.  Musculoskeletal: Negative for other MSK pain or swelling Skin: Negative for color change and worsening wound.  Neurological: Negative for tremors and numbness other than noted  Psychiatric/Behavioral: Negative for decreased concentration or agitation other than above       Objective:   Physical Exam BP 140/80 mmHg  Pulse 102  Temp(Src) 98 F (36.7 C) (Oral)  Ht 5' 6.5" (1.689 m)  Wt 212 lb 4 oz (96.276 kg)  BMI 33.75 kg/m2  SpO2 97% VS noted,  Constitutional: Pt appears well-developed, well-nourished.  HENT: Head: NCAT.  Right Ear: External ear normal.  Left Ear: External ear normal.  Eyes: . Pupils are equal, round, and reactive to light. Conjunctivae and EOM are normal Neck: Normal range of motion. Neck supple.  Cardiovascular: Normal rate and regular rhythm.   Pulmonary/Chest: Effort normal and breath sounds normal.  Abd:  Soft, NT, ND, + BS Spine nontender + mod left lumbar paravertebral tender/spasm Neurological: Pt is alert. Not confused , motor grossly intact, sens intact  and gait intact Skin: Skin is warm. No rash Psychiatric: Pt behavior is normal. No agitation.     Assessment & Plan:

## 2014-03-22 NOTE — Telephone Encounter (Signed)
[facility] - Client TELEPHONE [place] [facility] Patient Name: [name] Gender: Female DOB: 03/06/1962 Age: 58 Y 16 D Return Phone Number: Address: City/State/Zip: [place] [employer] [facility] - Client Client Site [place] - Night Contact Type Call Call Type Page Only Caller Name Courtney Paris Cone Relationship To Patient Care Giver Is this call to report lab results? No Return Phone Number Unavailable Initial Comment (1st attempt) Caller states the patient had a 6 second pause. CBN: [phone] Stated his doctor is Dr. Merleen Milliner Nurse Assessment Guidelines Guideline Title Affirmed Question Affirmed Notes Nurse Date/Time Eilene Ghazi Time) Disp. Time Eilene Ghazi Time) Disposition Final User 03/21/2014 6:50:39 PM Send to [place], [place] 03/21/2014 7:10:59 PM Called On-Call Provider Clydene Laming, Amy 03/21/2014 7:13:05 PM Page Completed Yes Clydene Laming, Amy After Care Instructions Given Call Event Type User Date / Time Description Comments User: Greenway Lions Date/Time Eilene Ghazi Time): 03/21/2014 7:12:39 PM Phone caller back and told her on-call was for out-patient not in-patient. She said she would try another number. [place] DateTime Result/Outcome Notes Kathlene November [phone] 03/21/2014 7:10:59 PM Called On Call Provider - [place], [place] 03/21/2014 7:11:47 PM Spoke with On Call - General Phoned on-call. He stated he was on-call for out-patient no inpatient

## 2014-03-22 NOTE — Progress Notes (Signed)
Pre visit review using our clinic review tool, if applicable. No additional management support is needed unless otherwise documented below in the visit note. 

## 2014-03-24 DIAGNOSIS — M545 Low back pain, unspecified: Secondary | ICD-10-CM | POA: Insufficient documentation

## 2014-03-24 NOTE — Assessment & Plan Note (Signed)
stable overall by history and exam, recent data reviewed with pt, and pt to continue medical treatment as before,  to f/u any worsening symptoms or concerns BP Readings from Last 3 Encounters:  03/22/14 140/80  08/04/13 128/80  07/06/13 150/70

## 2014-03-24 NOTE — Assessment & Plan Note (Signed)
C/w mechanical back pain, for pain control, muscle relaxer, gradual increased activity levels, no indication for imaging at this time/no neuro changes

## 2014-04-10 ENCOUNTER — Telehealth: Payer: Self-pay | Admitting: Internal Medicine

## 2014-04-10 NOTE — Telephone Encounter (Signed)
Needs CPE labs ordered prior to physical on 4/2

## 2014-04-10 NOTE — Telephone Encounter (Signed)
Already ordered - see "order review" tab

## 2014-06-29 ENCOUNTER — Ambulatory Visit (INDEPENDENT_AMBULATORY_CARE_PROVIDER_SITE_OTHER): Payer: Managed Care, Other (non HMO) | Admitting: Family Medicine

## 2014-06-29 VITALS — BP 116/62 | HR 94 | Temp 97.8°F | Resp 18 | Ht 67.0 in | Wt 208.0 lb

## 2014-06-29 DIAGNOSIS — R10819 Abdominal tenderness, unspecified site: Secondary | ICD-10-CM

## 2014-06-29 DIAGNOSIS — N390 Urinary tract infection, site not specified: Secondary | ICD-10-CM

## 2014-06-29 DIAGNOSIS — Z87442 Personal history of urinary calculi: Secondary | ICD-10-CM

## 2014-06-29 DIAGNOSIS — R319 Hematuria, unspecified: Secondary | ICD-10-CM

## 2014-06-29 DIAGNOSIS — R300 Dysuria: Secondary | ICD-10-CM | POA: Diagnosis not present

## 2014-06-29 DIAGNOSIS — R109 Unspecified abdominal pain: Secondary | ICD-10-CM

## 2014-06-29 LAB — POCT CBC
Granulocyte percent: 63 %G (ref 37–80)
HCT, POC: 36.7 % — AB (ref 37.7–47.9)
Hemoglobin: 11.2 g/dL — AB (ref 12.2–16.2)
Lymph, poc: 2.6 (ref 0.6–3.4)
MCH, POC: 27.6 pg (ref 27–31.2)
MCHC: 30.6 g/dL — AB (ref 31.8–35.4)
MCV: 90 fL (ref 80–97)
MID (cbc): 0.4 (ref 0–0.9)
MPV: 6.3 fL (ref 0–99.8)
POC Granulocyte: 5.2 (ref 2–6.9)
POC LYMPH PERCENT: 31.8 %L (ref 10–50)
POC MID %: 5.2 %M (ref 0–12)
Platelet Count, POC: 184 10*3/uL (ref 142–424)
RBC: 4.08 M/uL (ref 4.04–5.48)
RDW, POC: 14.4 %
WBC: 8.3 10*3/uL (ref 4.6–10.2)

## 2014-06-29 LAB — BASIC METABOLIC PANEL
BUN: 12 mg/dL (ref 6–23)
CO2: 24 mEq/L (ref 19–32)
Calcium: 9.1 mg/dL (ref 8.4–10.5)
Chloride: 103 mEq/L (ref 96–112)
Creat: 0.77 mg/dL (ref 0.50–1.10)
Glucose, Bld: 83 mg/dL (ref 70–99)
Potassium: 4.3 mEq/L (ref 3.5–5.3)
Sodium: 137 mEq/L (ref 135–145)

## 2014-06-29 LAB — POCT URINALYSIS DIPSTICK
Bilirubin, UA: NEGATIVE
Glucose, UA: NEGATIVE
Nitrite, UA: POSITIVE
Protein, UA: 100
Spec Grav, UA: 1.025
Urobilinogen, UA: 0.2
pH, UA: 5.5

## 2014-06-29 LAB — POCT UA - MICROSCOPIC ONLY
Casts, Ur, LPF, POC: NEGATIVE
Crystals, Ur, HPF, POC: NEGATIVE
Mucus, UA: NEGATIVE
Yeast, UA: NEGATIVE

## 2014-06-29 MED ORDER — PHENAZOPYRIDINE HCL 200 MG PO TABS: 200.0000 mg | ORAL_TABLET | Freq: Three times a day (TID) | ORAL | Status: DC | PRN

## 2014-06-29 MED ORDER — CIPROFLOXACIN HCL 500 MG PO TABS: 500.0000 mg | ORAL_TABLET | Freq: Two times a day (BID) | ORAL | Status: AC

## 2014-06-29 NOTE — Progress Notes (Signed)
MRN: [id number] DOB: 01/23/1956  Subjective:   [name] is a 59 y.o. female presenting for chief complaint of Flank Pain; Dysuria; and Hematuria  Reports 3 day history left flank pain, dysuria, pelvic pressure, hematuria, cloudy urine, nausea elicited from flank pain. Took a muscle relaxer x1 with minimal relief. Denies fevers, vomiting, vaginal irritation, vaginal discharge, rashes, history of kidney disease. Admits history of renal stone (left side) 1980's, 2015; abd U/S from 05/31/2013 showed mild-moderate left hydronephrosis, diagnosed clinically with renal stone with mild hydronephrosis, treated with pain meds and fluids. She also used to have frequent UTI's but has not had issues with this in the past several years. Reports that this feels like UTI's she's had in the past, not like the renal stones. Drinks less than a liter of water daily. Of note, patient uses opioid pain medication for neck pain, reports that she uses this intermittently, not daily. Denies smoking or alcohol use. Denies any other aggravating or relieving factors, no other questions or concerns.  [name] has a current medication list which includes the following prescription(s): alprazolam, cyclobenzaprine, estradiol, fluticasone, hydrocodone-acetaminophen, levothyroxine, and lisinopril. She has No Known Allergies.  [name]  has a past medical history of [place] (04/03/2010); HYPERTENSION (01/10/2007); HYPOTHYROIDISM (11/02/2006); HYPERLIPIDEMIA (01/10/2007); Chronic maxillary sinusitis (10/05/2008); ALLERGIC RHINITIS (11/02/2006); GERD (11/02/2006); NEPHROLITHIASIS, HX OF (01/10/2007); Chronic neck pain (04/15/2011); and Cervical disc disease (04/15/2011). Also  has past surgical history that includes Abdominal hysterectomy (1996); Tonsillectomy (1961); and s/p selective nerve root block 2002.  ROS As in subjective.  Objective:   Vitals: BP 116/62 mmHg  Pulse 94  Temp(Src) 97.8 F (36.6 C)  Resp 18  Ht 5\' 7"   (1.702 m)  Wt 208 lb (94.348 kg)  BMI 32.57 kg/m2  SpO2 97%  Physical Exam  Constitutional: She is oriented to person, place, and time and well-developed, well-nourished, and in no distress.  Eyes: No scleral icterus.  Cardiovascular: Normal rate.   Pulmonary/Chest: Effort normal.  Abdominal: Soft. Bowel sounds are normal. She exhibits no distension and no mass. There is tenderness (left-sided).  Left-sided CVA tenderness. No peri-umbilical or flank ecchymosis.  Musculoskeletal: Normal range of motion. She exhibits no edema or tenderness.  Neurological: She is alert and oriented to person, place, and time.  Skin: Skin is warm and dry. No rash noted. No erythema. No pallor.   Results for orders placed or performed in visit on 06/29/14 (from the past 24 hour(s))  POCT urinalysis dipstick     Status: None   Collection Time: 06/29/14  1:47 PM  Result Value Ref Range   Color, UA yellow    Clarity, UA cloudy    Glucose, UA neg    Bilirubin, UA neg    Ketones, UA trace    Spec Grav, UA 1.025    Blood, UA large    pH, UA 5.5    Protein, UA 100    Urobilinogen, UA 0.2    Nitrite, UA pos    Leukocytes, UA small (1+)   POCT UA - Microscopic Only     Status: None   Collection Time: 06/29/14  1:59 PM  Result Value Ref Range   WBC, Ur, HPF, POC TNTC    RBC, urine, microscopic TNTC    Bacteria, U Microscopic 2+    Mucus, UA neg    Epithelial cells, urine per micros 0-2    Crystals, Ur, HPF, POC neg    Casts, Ur, LPF, POC neg    Yeast,  UA neg   POCT CBC     Status: Abnormal   Collection Time: 06/29/14  1:59 PM  Result Value Ref Range   WBC 8.3 4.6 - 10.2 K/uL   Lymph, poc 2.6 0.6 - 3.4   POC LYMPH PERCENT 31.8 10 - 50 %L   MID (cbc) 0.4 0 - 0.9   POC MID % 5.2 0 - 12 %M   POC Granulocyte 5.2 2 - 6.9   Granulocyte percent 63.0 37 - 80 %G   RBC 4.08 4.04 - 5.48 M/uL   Hemoglobin 11.2 (A) 12.2 - 16.2 g/dL   HCT, POC 36.7 (A) 37.7 - 47.9 %   MCV 90.0 80 - 97 fL   MCH, POC 27.6 27  - 31.2 pg   MCHC 30.6 (A) 31.8 - 35.4 g/dL   RDW, POC 14.4 %   Platelet Count, POC 184 142 - 424 K/uL   MPV 6.3 0 - 99.8 fL   Assessment and Plan :   1. Dysuria 2. Flank pain 3. Abdominal tenderness in left flank 4. Urinary tract infection with hematuria, site unspecified 5. History of renal stone - Start Ciprofloxacin x10 days for UTI, urine culture pending, pyridium for dysuria. Consider recurrent renal stone, also consider obtaining CT scan, labs pending. Consider referral to urology for continued management.  - Advised to return to clinic in 2 days if no improvement in symptoms or report to ED if worsening symptoms.   Jaynee Eagles, PA-C [facility] [phone] 06/29/2014 1:31 PM   Robyn Haber, MD

## 2014-06-29 NOTE — Patient Instructions (Signed)

## 2014-06-30 ENCOUNTER — Encounter: Payer: Self-pay | Admitting: Urgent Care

## 2014-06-30 ENCOUNTER — Other Ambulatory Visit: Payer: Self-pay | Admitting: Internal Medicine

## 2014-07-02 LAB — URINE CULTURE: Colony Count: >=100000

## 2014-07-06 ENCOUNTER — Other Ambulatory Visit (INDEPENDENT_AMBULATORY_CARE_PROVIDER_SITE_OTHER): Payer: Managed Care, Other (non HMO)

## 2014-07-06 DIAGNOSIS — Z0000 Encounter for general adult medical examination without abnormal findings: Secondary | ICD-10-CM

## 2014-07-06 DIAGNOSIS — R7989 Other specified abnormal findings of blood chemistry: Secondary | ICD-10-CM | POA: Diagnosis not present

## 2014-07-06 LAB — LDL CHOLESTEROL, DIRECT: Direct LDL: 112 mg/dL

## 2014-07-06 LAB — BASIC METABOLIC PANEL
BUN: 12 mg/dL (ref 6–23)
CO2: 31 mEq/L (ref 19–32)
Calcium: 9.6 mg/dL (ref 8.4–10.5)
Chloride: 101 mEq/L (ref 96–112)
Creatinine, Ser: 0.77 mg/dL (ref 0.40–1.20)
GFR: 81.57 mL/min (ref >60.00)
Glucose, Bld: 83 mg/dL (ref 70–99)
Potassium: 4.3 mEq/L (ref 3.5–5.1)
Sodium: 135 mEq/L (ref 135–145)

## 2014-07-06 LAB — LIPID PANEL
Cholesterol: 204 mg/dL — ABNORMAL HIGH (ref 0–200)
HDL: 57.9 mg/dL (ref >39.00)
NonHDL: 146.1
Total CHOL/HDL Ratio: 4
Triglycerides: 257 mg/dL — ABNORMAL HIGH (ref 0.0–149.0)
VLDL: 51.4 mg/dL — ABNORMAL HIGH (ref 0.0–40.0)

## 2014-07-06 LAB — CBC WITH DIFFERENTIAL/PLATELET
Basophils Absolute: 0 10*3/uL (ref 0.0–0.1)
Basophils Relative: 0.6 % (ref 0.0–3.0)
Eosinophils Absolute: 0.1 10*3/uL (ref 0.0–0.7)
Eosinophils Relative: 2 % (ref 0.0–5.0)
HCT: 37.5 % (ref 36.0–46.0)
Hemoglobin: 12.7 g/dL (ref 12.0–15.0)
Lymphocytes Relative: 34.9 % (ref 12.0–46.0)
Lymphs Abs: 2.1 10*3/uL (ref 0.7–4.0)
MCHC: 33.8 g/dL (ref 30.0–36.0)
MCV: 86.1 fl (ref 78.0–100.0)
Monocytes Absolute: 0.4 10*3/uL (ref 0.1–1.0)
Monocytes Relative: 6.9 % (ref 3.0–12.0)
Neutro Abs: 3.3 10*3/uL (ref 1.4–7.7)
Neutrophils Relative %: 55.6 % (ref 43.0–77.0)
Platelets: 216 10*3/uL (ref 150.0–400.0)
RBC: 4.35 Mil/uL (ref 3.87–5.11)
RDW: 13.2 % (ref 11.5–15.5)
WBC: 6 10*3/uL (ref 4.0–10.5)

## 2014-07-06 LAB — HEPATIC FUNCTION PANEL
ALT: 10 U/L (ref 0–35)
AST: 13 U/L (ref 0–37)
Albumin: 4 g/dL (ref 3.5–5.2)
Alkaline Phosphatase: 99 U/L (ref 39–117)
Bilirubin, Direct: 0.1 mg/dL (ref 0.0–0.3)
Total Bilirubin: 0.3 mg/dL (ref 0.2–1.2)
Total Protein: 6.8 g/dL (ref 6.0–8.3)

## 2014-07-06 LAB — URINALYSIS, ROUTINE W REFLEX MICROSCOPIC
Bilirubin Urine: NEGATIVE
Hgb urine dipstick: NEGATIVE
Ketones, ur: NEGATIVE
Leukocytes, UA: NEGATIVE
Nitrite: NEGATIVE
Specific Gravity, Urine: <=1.005 — AB (ref 1.000–1.030)
Total Protein, Urine: NEGATIVE
Urine Glucose: NEGATIVE
Urobilinogen, UA: 0.2 (ref 0.0–1.0)
pH: 7 (ref 5.0–8.0)

## 2014-07-06 LAB — TSH: TSH: 0.3 u[IU]/mL — ABNORMAL LOW (ref 0.35–4.50)

## 2014-07-10 ENCOUNTER — Encounter: Payer: Self-pay | Admitting: Internal Medicine

## 2014-07-10 ENCOUNTER — Ambulatory Visit (INDEPENDENT_AMBULATORY_CARE_PROVIDER_SITE_OTHER): Payer: Managed Care, Other (non HMO) | Admitting: Internal Medicine

## 2014-07-10 VITALS — BP 128/84 | HR 77 | Temp 98.2°F | Resp 16 | Ht 67.0 in | Wt 208.0 lb

## 2014-07-10 DIAGNOSIS — Z0000 Encounter for general adult medical examination without abnormal findings: Secondary | ICD-10-CM

## 2014-07-10 MED ORDER — LISINOPRIL 40 MG PO TABS: ORAL_TABLET | ORAL | Status: DC

## 2014-07-10 MED ORDER — LEVOTHYROXINE SODIUM 150 MCG PO TABS: ORAL_TABLET | ORAL | Status: DC

## 2014-07-10 NOTE — Progress Notes (Signed)
Pre visit review using our clinic review tool, if applicable. No additional management support is needed unless otherwise documented below in the visit note. 

## 2014-07-10 NOTE — Progress Notes (Signed)
Subjective:    Patient ID: [name], female    DOB: 12-04-1955, 59 y.o.   MRN: [id number]  HPI  Here for wellness and f/u;  Overall doing ok;  Pt denies Chest pain, worsening SOB, DOE, wheezing, orthopnea, PND, worsening LE edema, palpitations, dizziness or syncope.  Pt denies neurological change such as new headache, facial or extremity weakness.  Pt denies polydipsia, polyuria, or low sugar symptoms. Pt states overall good compliance with treatment and medications, good tolerability, and has been trying to follow appropriate diet.  Pt denies worsening depressive symptoms, suicidal ideation or panic. No fever, night sweats, wt loss, loss of appetite, or other constitutional symptoms.  Pt states good ability with ADL's, has low fall risk, home safety reviewed and adequate, no other significant changes in hearing or vision, and only occasionally active with exercise. Wt Readings from Last 3 Encounters:  07/10/14 208 lb (94.348 kg)  06/29/14 208 lb (94.348 kg)  03/22/14 212 lb 4 oz (96.276 kg)  No current complaints. Has seen Dr Nelva Bush for right neck and arm pain recent, for pain control for now, no ESI or other planned Past Medical History  Diagnosis Date  . SINUSITIS- ACUTE-NOS 04/03/2010  . HYPERTENSION 01/10/2007  . HYPOTHYROIDISM 11/02/2006  . HYPERLIPIDEMIA 01/10/2007  . Chronic maxillary sinusitis 10/05/2008  . ALLERGIC RHINITIS 11/02/2006  . GERD 11/02/2006  . NEPHROLITHIASIS, HX OF 01/10/2007  . Chronic neck pain 04/15/2011  . Cervical disc disease 04/15/2011   Past Surgical History  Procedure Laterality Date  . Abdominal hysterectomy  1996  . Tonsillectomy  1961  . S/p selective nerve root block 2002      per Dr. Louanne Skye to right neck    reports that she has never smoked. She has never used smokeless tobacco. She reports that she does not drink alcohol or use illicit drugs. family history includes Diabetes in her other; Hypertension in her other. No Known Allergies Current Outpatient  Prescriptions on File Prior to Visit  Medication Sig Dispense Refill  . ALPRAZolam (XANAX) 0.5 MG tablet   2  . cyclobenzaprine (FLEXERIL) 5 MG tablet   0  . estradiol (ESTRACE) 2 MG tablet Take 2 mg by mouth daily.    . fluticasone (FLONASE) 50 MCG/ACT nasal spray Place 1 spray into both nostrils daily. 16 g 11  . HYDROcodone-acetaminophen (NORCO/VICODIN) 5-325 MG per tablet   0  . levothyroxine (SYNTHROID, LEVOTHROID) 150 MCG tablet TAKE 1 TABLET (150 MCG TOTAL) BY MOUTH DAILY BEFORE BREAKFAST. 90 tablet 0  . lisinopril (PRINIVIL,ZESTRIL) 40 MG tablet TAKE 1 TABLET (40 MG TOTAL) BY MOUTH DAILY. 90 tablet 0  . phenazopyridine (PYRIDIUM) 200 MG tablet Take 1 tablet (200 mg total) by mouth 3 (three) times daily as needed for pain. 9 tablet 0   No current facility-administered medications on file prior to visit.   Review of Systems Constitutional: Negative for increased diaphoresis, other activity, appetite or siginficant weight change other than noted HENT: Negative for worsening hearing loss, ear pain, facial swelling, mouth sores and neck stiffness.   Eyes: Negative for other worsening pain, redness or visual disturbance.  Respiratory: Negative for shortness of breath and wheezing  Cardiovascular: Negative for chest pain and palpitations.  Gastrointestinal: Negative for diarrhea, blood in stool, abdominal distention or other pain Genitourinary: Negative for hematuria, flank pain or change in urine volume.  Musculoskeletal: Negative for myalgias or other joint complaints.  Skin: Negative for color change and wound or drainage.  Neurological: Negative for  syncope and numbness. other than noted Hematological: Negative for adenopathy. or other swelling Psychiatric/Behavioral: Negative for hallucinations, SI, self-injury, decreased concentration or other worsening agitation.      Objective:   Physical Exam BP 128/84 mmHg  Pulse 77  Temp(Src) 98.2 F (36.8 C) (Oral)  Resp 16  Ht 5\' 7"   (1.702 m)  Wt 208 lb (94.348 kg)  BMI 32.57 kg/m2  SpO2 99% VS noted,  Constitutional: Pt is oriented to person, place, and time. Appears well-developed and well-nourished, in no significant distress Head: Normocephalic and atraumatic.  Right Ear: External ear normal.  Left Ear: External ear normal.  Nose: Nose normal.  Mouth/Throat: Oropharynx is clear and moist.  Eyes: Conjunctivae and EOM are normal. Pupils are equal, round, and reactive to light.  Neck: Normal range of motion. Neck supple. No JVD present. No tracheal deviation present or significant neck LA or mass Cardiovascular: Normal rate, regular rhythm, normal heart sounds and intact distal pulses.   Pulmonary/Chest: Effort normal and breath sounds without rales or wheezing  Abdominal: Soft. Bowel sounds are normal. NT. No HSM  Musculoskeletal: Normal range of motion. Exhibits no edema.  Lymphadenopathy:  Has no cervical adenopathy.  Neurological: Pt is alert and oriented to person, place, and time. Pt has normal reflexes. No cranial nerve deficit. Motor grossly intact Skin: Skin is warm and dry. No rash noted.  Psychiatric:  Has normal mood and affect. Behavior is normal.     Assessment & Plan:

## 2014-07-10 NOTE — Addendum Note (Signed)
Addended by: Biagio Borg on: 07/10/2014 10:25 AM   Modules accepted: Miquel Dunn

## 2014-07-10 NOTE — Patient Instructions (Addendum)
Your EKG was OK today, as well as the Labs  Please continue all other medications as before, and refills have been done if requested.  Please have the pharmacy call with any other refills you may need.  Please continue your efforts at being more active, low cholesterol diet, and weight control.  You are otherwise up to date with prevention measures today.  Please keep your appointments with your specialists as you may have planned  Please return in 1 year for your yearly visit, or sooner if needed, with Lab testing done 3-5 days before

## 2014-07-10 NOTE — Addendum Note (Signed)
Addended by: Valerie Salts on: 07/10/2014 10:26 AM   Modules accepted: Orders

## 2014-07-10 NOTE — Assessment & Plan Note (Addendum)

## 2014-10-05 LAB — HM MAMMOGRAPHY

## 2015-05-08 ENCOUNTER — Encounter: Payer: Self-pay | Admitting: Gastroenterology

## 2015-05-31 ENCOUNTER — Other Ambulatory Visit (INDEPENDENT_AMBULATORY_CARE_PROVIDER_SITE_OTHER): Payer: Managed Care, Other (non HMO)

## 2015-05-31 DIAGNOSIS — Z0000 Encounter for general adult medical examination without abnormal findings: Secondary | ICD-10-CM

## 2015-05-31 LAB — CBC WITH DIFFERENTIAL/PLATELET
Basophils Absolute: 0 10*3/uL (ref 0.0–0.1)
Basophils Relative: 0.5 % (ref 0.0–3.0)
Eosinophils Absolute: 0.1 10*3/uL (ref 0.0–0.7)
Eosinophils Relative: 1.7 % (ref 0.0–5.0)
HCT: 37.3 % (ref 36.0–46.0)
Hemoglobin: 12.5 g/dL (ref 12.0–15.0)
Lymphocytes Relative: 34.2 % (ref 12.0–46.0)
Lymphs Abs: 2.5 10*3/uL (ref 0.7–4.0)
MCHC: 33.5 g/dL (ref 30.0–36.0)
MCV: 86.8 fl (ref 78.0–100.0)
Monocytes Absolute: 0.4 10*3/uL (ref 0.1–1.0)
Monocytes Relative: 5.8 % (ref 3.0–12.0)
Neutro Abs: 4.2 10*3/uL (ref 1.4–7.7)
Neutrophils Relative %: 57.8 % (ref 43.0–77.0)
Platelets: 228 10*3/uL (ref 150.0–400.0)
RBC: 4.3 Mil/uL (ref 3.87–5.11)
RDW: 14.2 % (ref 11.5–15.5)
WBC: 7.2 10*3/uL (ref 4.0–10.5)

## 2015-05-31 LAB — BASIC METABOLIC PANEL
BUN: 12 mg/dL (ref 6–23)
CO2: 28 mEq/L (ref 19–32)
Calcium: 9.3 mg/dL (ref 8.4–10.5)
Chloride: 103 mEq/L (ref 96–112)
Creatinine, Ser: 0.78 mg/dL (ref 0.40–1.20)
GFR: 80.11 mL/min (ref >60.00)
Glucose, Bld: 92 mg/dL (ref 70–99)
Potassium: 4.8 mEq/L (ref 3.5–5.1)
Sodium: 138 mEq/L (ref 135–145)

## 2015-05-31 LAB — URINALYSIS, ROUTINE W REFLEX MICROSCOPIC
Bilirubin Urine: NEGATIVE
Ketones, ur: NEGATIVE
Leukocytes, UA: NEGATIVE
Nitrite: NEGATIVE
Specific Gravity, Urine: 1.015 (ref 1.000–1.030)
Total Protein, Urine: NEGATIVE
Urine Glucose: NEGATIVE
Urobilinogen, UA: 0.2 (ref 0.0–1.0)
pH: 6 (ref 5.0–8.0)

## 2015-05-31 LAB — LIPID PANEL
Cholesterol: 201 mg/dL — ABNORMAL HIGH (ref 0–200)
HDL: 59.8 mg/dL (ref >39.00)
LDL Cholesterol: 105 mg/dL — ABNORMAL HIGH (ref 0–99)
NonHDL: 140.91
Total CHOL/HDL Ratio: 3
Triglycerides: 179 mg/dL — ABNORMAL HIGH (ref 0.0–149.0)
VLDL: 35.8 mg/dL (ref 0.0–40.0)

## 2015-05-31 LAB — HEPATIC FUNCTION PANEL
ALT: 13 U/L (ref 0–35)
AST: 14 U/L (ref 0–37)
Albumin: 4.1 g/dL (ref 3.5–5.2)
Alkaline Phosphatase: 94 U/L (ref 39–117)
Bilirubin, Direct: 0.1 mg/dL (ref 0.0–0.3)
Total Bilirubin: 0.3 mg/dL (ref 0.2–1.2)
Total Protein: 6.9 g/dL (ref 6.0–8.3)

## 2015-05-31 LAB — TSH: TSH: 1.08 u[IU]/mL (ref 0.35–4.50)

## 2015-06-10 ENCOUNTER — Ambulatory Visit (INDEPENDENT_AMBULATORY_CARE_PROVIDER_SITE_OTHER): Payer: Managed Care, Other (non HMO) | Admitting: Internal Medicine

## 2015-06-10 ENCOUNTER — Encounter: Payer: Self-pay | Admitting: Internal Medicine

## 2015-06-10 VITALS — BP 154/80 | HR 85 | Temp 98.0°F | Ht 67.0 in | Wt 212.8 lb

## 2015-06-10 DIAGNOSIS — Z0000 Encounter for general adult medical examination without abnormal findings: Secondary | ICD-10-CM

## 2015-06-10 DIAGNOSIS — I10 Essential (primary) hypertension: Secondary | ICD-10-CM | POA: Diagnosis not present

## 2015-06-10 DIAGNOSIS — E785 Hyperlipidemia, unspecified: Secondary | ICD-10-CM

## 2015-06-10 DIAGNOSIS — J0191 Acute recurrent sinusitis, unspecified: Secondary | ICD-10-CM

## 2015-06-10 DIAGNOSIS — Z0001 Encounter for general adult medical examination with abnormal findings: Secondary | ICD-10-CM | POA: Diagnosis not present

## 2015-06-10 MED ORDER — ASPIRIN EC 81 MG PO TBEC: 81.0000 mg | DELAYED_RELEASE_TABLET | Freq: Every day | ORAL | Status: AC

## 2015-06-10 MED ORDER — AMLODIPINE BESYLATE 5 MG PO TABS: 5.0000 mg | ORAL_TABLET | Freq: Every day | ORAL | Status: DC

## 2015-06-10 MED ORDER — LEVOFLOXACIN 250 MG PO TABS: 250.0000 mg | ORAL_TABLET | Freq: Every day | ORAL | Status: DC

## 2015-06-10 NOTE — Assessment & Plan Note (Signed)
Mild uncontrolled, to add amlodipine 5 qd, cont all other meds, cont wt loss efforts, o/w stable overall by history and exam, recent data reviewed with pt, and pt to continue medical treatment as before,  to f/u any worsening symptoms or concerns BP Readings from Last 3 Encounters:  06/10/15 154/80  07/10/14 128/84  06/29/14 116/62

## 2015-06-10 NOTE — Progress Notes (Signed)
Subjective:    Patient ID: [name], female    DOB: 03-19-1956, 60 y.o.   MRN: [id number]  HPI  Here for wellness and f/u;  Overall doing ok;  Pt denies Chest pain, worsening SOB, DOE, wheezing, orthopnea, PND, worsening LE edema, palpitations, dizziness or syncope.  Pt denies neurological change such as new headache, facial or extremity weakness.  Pt denies polydipsia, polyuria, or low sugar symptoms. Pt states overall good compliance with treatment and medications, good tolerability, and has been trying to follow appropriate diet.  Pt denies worsening depressive symptoms, suicidal ideation or panic. No night sweats, wt loss, loss of appetite, or other constitutional symptoms.  Pt states good ability with ADL's, has low fall risk, home safety reviewed and adequate, no other significant changes in hearing or vision, and only occasionally active with exercise.  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  BP has been elevated mildly on several occasions recent in the 150 range despite good med compliance, though may have gained several lbs.  Denies symptoms of sleep apnea Wt Readings from Last 3 Encounters:  06/10/15 212 lb 12 oz (96.503 kg)  07/10/14 208 lb (94.348 kg)  06/29/14 208 lb (94.348 kg)  Also incidentally  Here with 2-3 days acute onset fever, facial pain, pressure, headache, general weakness and malaise, and greenish d/c, with mild ST and cough,  Is chronic recurrent issue, often takes 2 wks antibx course, has seen allergy and ENT, had CT scan sinus neg x 3.  Not yet taking asa for prophylaxis Past Medical History  Diagnosis Date  . SINUSITIS- ACUTE-NOS 04/03/2010  . HYPERTENSION 01/10/2007  . HYPOTHYROIDISM 11/02/2006  . HYPERLIPIDEMIA 01/10/2007  . Chronic maxillary sinusitis 10/05/2008  . ALLERGIC RHINITIS 11/02/2006  . GERD 11/02/2006  . NEPHROLITHIASIS, HX OF 01/10/2007  . Chronic neck pain 04/15/2011  . Cervical disc disease  04/15/2011   Past Surgical History  Procedure Laterality Date  . Abdominal hysterectomy  1996  . Tonsillectomy  1961  . S/p selective nerve root block 2002      per Dr. Louanne Skye to right neck    reports that she has never smoked. She has never used smokeless tobacco. She reports that she does not drink alcohol or use illicit drugs. family history includes Diabetes in her other; Hypertension in her other. No Known Allergies Current Outpatient Prescriptions on File Prior to Visit  Medication Sig Dispense Refill  . ALPRAZolam (XANAX) 0.5 MG tablet   2  . cyclobenzaprine (FLEXERIL) 5 MG tablet   0  . estradiol (ESTRACE) 2 MG tablet Take 2 mg by mouth daily.    . fluticasone (FLONASE) 50 MCG/ACT nasal spray Place 1 spray into both nostrils daily. 16 g 11  . HYDROcodone-acetaminophen (NORCO/VICODIN) 5-325 MG per tablet   0  . levothyroxine (SYNTHROID, LEVOTHROID) 150 MCG tablet TAKE 1 TABLET (150 MCG TOTAL) BY MOUTH DAILY BEFORE BREAKFAST. 90 tablet 3  . lisinopril (PRINIVIL,ZESTRIL) 40 MG tablet TAKE 1 TABLET (40 MG TOTAL) BY MOUTH DAILY. 90 tablet 3  . phenazopyridine (PYRIDIUM) 200 MG tablet Take 1 tablet (200 mg total) by mouth 3 (three) times daily as needed for pain. 9 tablet 0   No current facility-administered medications on file prior to visit.    Review of Systems Constitutional: Negative for increased diaphoresis, other activity, appetite or siginficant weight change other than noted HENT: Negative for worsening hearing loss, ear pain, facial swelling, mouth sores and  neck stiffness.   Eyes: Negative for other worsening pain, redness or visual disturbance.  Respiratory: Negative for shortness of breath and wheezing  Cardiovascular: Negative for chest pain and palpitations.  Gastrointestinal: Negative for diarrhea, blood in stool, abdominal distention or other pain Genitourinary: Negative for hematuria, flank pain or change in urine volume.  Musculoskeletal: Negative for myalgias or  other joint complaints.  Skin: Negative for color change and wound or drainage.  Neurological: Negative for syncope and numbness. other than noted Hematological: Negative for adenopathy. or other swelling Psychiatric/Behavioral: Negative for hallucinations, SI, self-injury, decreased concentration or other worsening agitation.      Objective:   Physical Exam BP 154/80 mmHg  Pulse 85  Temp(Src) 98 F (36.7 C) (Oral)  Ht 5\' 7"  (1.702 m)  Wt 212 lb 12 oz (96.503 kg)  BMI 33.31 kg/m2  SpO2 97% VS noted,  Constitutional: Pt is oriented to person, place, and time. Appears well-developed and well-nourished, in no significant distress Head: Normocephalic and atraumatic.  Right Ear: External ear normal.  Left Ear: External ear normal.  Nose: Nose normal.  Mouth/Throat: Oropharynx is clear and moist.  Bilat tm's with mild erythema.  Max sinus areas mild to mod tender.  Pharynx with mild erythema, no exudate Eyes: Conjunctivae and EOM are normal. Pupils are equal, round, and reactive to light.  Neck: Normal range of motion. Neck supple. No JVD present. No tracheal deviation present or significant neck LA or mass Cardiovascular: Normal rate, regular rhythm, normal heart sounds and intact distal pulses.   Pulmonary/Chest: Effort normal and breath sounds without rales or wheezing  Abdominal: Soft. Bowel sounds are normal. NT. No HSM  Musculoskeletal: Normal range of motion. Exhibits no edema.  Lymphadenopathy:  Has no cervical adenopathy.  Neurological: Pt is alert and oriented to person, place, and time. Pt has normal reflexes. No cranial nerve deficit. Motor grossly intact Skin: Skin is warm and dry. No rash noted.  Psychiatric:  Has normal mood and affect. Behavior is normal.         Assessment & Plan:

## 2015-06-10 NOTE — Assessment & Plan Note (Signed)
Mild to mod, for antibx course,  to f/u any worsening symptoms or concerns 

## 2015-06-10 NOTE — Assessment & Plan Note (Addendum)
Mild but less well controlled than 3 yrs ago, for lower chol diet, f/u with next labs  Lab Results  Component Value Date   LDLCALC 105* 05/31/2015

## 2015-06-10 NOTE — Patient Instructions (Addendum)
Please start the Aspirin 81 mg per day (coated only)  Please take all new medication as prescribed  - the amlodipine 5 mg per day for blood pressure, and the antibiotic  Please continue all other medications as before, and refills have been done if requested.  Please have the pharmacy call with any other refills you may need.  Please continue your efforts at being more active, low cholesterol diet, and weight control.  You are otherwise up to date with prevention measures today.  Please keep your appointments with your specialists as you may have planned  You will be contacted regarding the referral for: colonoscopy  Please return in 6 months, or sooner if needed

## 2015-06-10 NOTE — Assessment & Plan Note (Signed)

## 2015-06-10 NOTE — Progress Notes (Signed)
Pre visit review using our clinic review tool, if applicable. No additional management support is needed unless otherwise documented below in the visit note. 

## 2015-06-24 ENCOUNTER — Encounter: Payer: Self-pay | Admitting: Gastroenterology

## 2015-07-30 ENCOUNTER — Other Ambulatory Visit: Payer: Self-pay | Admitting: Internal Medicine

## 2015-08-19 ENCOUNTER — Encounter: Payer: Managed Care, Other (non HMO) | Admitting: Gastroenterology

## 2015-09-18 ENCOUNTER — Encounter: Payer: Self-pay | Admitting: Internal Medicine

## 2015-09-18 ENCOUNTER — Ambulatory Visit (INDEPENDENT_AMBULATORY_CARE_PROVIDER_SITE_OTHER): Payer: Managed Care, Other (non HMO) | Admitting: Internal Medicine

## 2015-09-18 VITALS — BP 118/70 | HR 102 | Temp 98.9°F | Resp 20 | Wt 217.0 lb

## 2015-09-18 DIAGNOSIS — J0191 Acute recurrent sinusitis, unspecified: Secondary | ICD-10-CM

## 2015-09-18 DIAGNOSIS — I10 Essential (primary) hypertension: Secondary | ICD-10-CM

## 2015-09-18 DIAGNOSIS — J320 Chronic maxillary sinusitis: Secondary | ICD-10-CM

## 2015-09-18 MED ORDER — CEFUROXIME AXETIL 250 MG PO TABS: 250.0000 mg | ORAL_TABLET | Freq: Two times a day (BID) | ORAL | Status: DC

## 2015-09-18 MED ORDER — HYDROCODONE-HOMATROPINE 5-1.5 MG/5ML PO SYRP: 5.0000 mL | ORAL_SOLUTION | Freq: Four times a day (QID) | ORAL | Status: DC | PRN

## 2015-09-18 NOTE — Progress Notes (Signed)
Subjective:    Patient ID: [name], female    DOB: Jan 12, 1956, 60 y.o.   MRN: [id number]  HPI   Here with 2-3 days acute onset fever, facial pain, pressure, headache, general weakness and malaise, and greenish d/c, with mild ST and cough, but pt denies chest pain, wheezing, increased sob or doe, orthopnea, PND, increased LE swelling, palpitations, dizziness or syncope.  Husband recently ill as well, not clear if same illness.  Pt denies chest pain, increased sob or doe, wheezing, orthopnea, PND, increased LE swelling, palpitations, dizziness or syncope.  Pt denies new neurological symptoms such as new headache, or facial or extremity weakness or numbness   Pt denies polydipsia, polyuria Past Medical History  Diagnosis Date  . SINUSITIS- ACUTE-NOS 04/03/2010  . HYPERTENSION 01/10/2007  . HYPOTHYROIDISM 11/02/2006  . HYPERLIPIDEMIA 01/10/2007  . Chronic maxillary sinusitis 10/05/2008  . ALLERGIC RHINITIS 11/02/2006  . GERD 11/02/2006  . NEPHROLITHIASIS, HX OF 01/10/2007  . Chronic neck pain 04/15/2011  . Cervical disc disease 04/15/2011  . Hyperlipidemia 01/10/2007    Qualifier: Diagnosis of  By: Jenny Reichmann MD, Hunt Oris    Past Surgical History  Procedure Laterality Date  . Abdominal hysterectomy  1996  . Tonsillectomy  1961  . S/p selective nerve root block 2002      per Dr. Louanne Skye to right neck    reports that she has never smoked. She has never used smokeless tobacco. She reports that she does not drink alcohol or use illicit drugs. family history includes Diabetes in her other; Hypertension in her other. No Known Allergies Current Outpatient Prescriptions on File Prior to Visit  Medication Sig Dispense Refill  . ALPRAZolam (XANAX) 0.5 MG tablet   2  . amLODipine (NORVASC) 5 MG tablet Take 1 tablet (5 mg total) by mouth daily. 90 tablet 3  . aspirin EC 81 MG tablet Take 1 tablet (81 mg total) by mouth daily. 90 tablet 11  . cyclobenzaprine (FLEXERIL) 5 MG tablet   0  . estradiol (ESTRACE) 2  MG tablet Take 2 mg by mouth daily.    . fluticasone (FLONASE) 50 MCG/ACT nasal spray Place 1 spray into both nostrils daily. 16 g 11  . HYDROcodone-acetaminophen (NORCO/VICODIN) 5-325 MG per tablet   0  . levofloxacin (LEVAQUIN) 250 MG tablet Take 1 tablet (250 mg total) by mouth daily. 14 tablet 0  . levothyroxine (SYNTHROID, LEVOTHROID) 150 MCG tablet TAKE 1 TABLET BY MOUTH DAILY BEFORE BREAKFAST 90 tablet 2  . lisinopril (PRINIVIL,ZESTRIL) 40 MG tablet TAKE 1 TABLET (40 MG TOTAL) BY MOUTH DAILY. 90 tablet 3  . phenazopyridine (PYRIDIUM) 200 MG tablet Take 1 tablet (200 mg total) by mouth 3 (three) times daily as needed for pain. 9 tablet 0   No current facility-administered medications on file prior to visit.   Review of Systems  Constitutional: Negative for unusual diaphoresis or night sweats HENT: Negative for ear swelling or discharge Eyes: Negative for worsening visual haziness  Respiratory: Negative for choking and stridor.   Gastrointestinal: Negative for distension or worsening eructation Genitourinary: Negative for retention or change in urine volume.  Musculoskeletal: Negative for other MSK pain or swelling Skin: Negative for color change and worsening wound Neurological: Negative for tremors and numbness other than noted  Psychiatric/Behavioral: Negative for decreased concentration or agitation other than above       Objective:   Physical Exam BP 118/70 mmHg  Pulse 102  Temp(Src) 98.9 F (37.2 C) (Oral)  Resp 20  Wt 217 lb (98.431 kg)  SpO2 98% VS noted, mild ill Constitutional: Pt appears in no apparent distress HENT: Head: NCAT.  Right Ear: External ear normal.  Left Ear: External ear normal.  Bilat tm's with mild erythema.  Max sinus areas mild tender.  Pharynx with mild erythema, no exudate Eyes: . Pupils are equal, round, and reactive to light. Conjunctivae and EOM are normal Neck: Normal range of motion. Neck supple.  Cardiovascular: Normal rate and regular  rhythm.   Pulmonary/Chest: Effort normal and breath sounds without rales or wheezing.  Neurological: Pt is alert. Not confused , motor grossly intact Skin: Skin is warm. No rash, no LE edema Psychiatric: Pt behavior is normal. No agitation.     Assessment & Plan:

## 2015-09-18 NOTE — Patient Instructions (Signed)
Please take all new medication as prescribed - the antibiotic, and the cough medicine if needed  Please continue all other medications as before, and refills have been done if requested.  Please have the pharmacy call with any other refills you may need.  Please keep your appointments with your specialists as you may have planned

## 2015-09-18 NOTE — Progress Notes (Signed)
Pre visit review using our clinic review tool, if applicable. No additional management support is needed unless otherwise documented below in the visit note. 

## 2015-09-19 NOTE — Assessment & Plan Note (Signed)
With flare as above, To continue the flonase asd,  to f/u any worsening symptoms or concerns

## 2015-09-19 NOTE — Assessment & Plan Note (Signed)
Mild to mod, for antibx course,  to f/u any worsening symptoms or concerns 

## 2015-09-19 NOTE — Assessment & Plan Note (Signed)
stable overall by history and exam, recent data reviewed with pt, and pt to continue medical treatment as before,  to f/u any worsening symptoms or concerns BP Readings from Last 3 Encounters:  09/18/15 118/70  06/10/15 154/80  07/10/14 128/84  \

## 2015-09-28 ENCOUNTER — Other Ambulatory Visit: Payer: Self-pay | Admitting: Internal Medicine

## 2015-10-15 ENCOUNTER — Ambulatory Visit (INDEPENDENT_AMBULATORY_CARE_PROVIDER_SITE_OTHER): Payer: Managed Care, Other (non HMO) | Admitting: Family Medicine

## 2015-10-15 ENCOUNTER — Encounter: Payer: Self-pay | Admitting: Family Medicine

## 2015-10-15 VITALS — BP 112/80 | HR 81 | Temp 98.1°F | Ht 67.0 in | Wt 211.3 lb

## 2015-10-15 DIAGNOSIS — R300 Dysuria: Secondary | ICD-10-CM

## 2015-10-15 LAB — POCT URINALYSIS DIPSTICK
Bilirubin, UA: NEGATIVE
Glucose, UA: NEGATIVE
Ketones, UA: NEGATIVE
Nitrite, UA: NEGATIVE
Protein, UA: NEGATIVE
Spec Grav, UA: 1.01
Urobilinogen, UA: 0.2
pH, UA: 6

## 2015-10-15 MED ORDER — NITROFURANTOIN MONOHYD MACRO 100 MG PO CAPS: 100.0000 mg | ORAL_CAPSULE | Freq: Two times a day (BID) | ORAL | Status: DC

## 2015-10-15 NOTE — Progress Notes (Signed)
  HPI:  Dysuria: -frequency, urgency, hematuria a few times for 3 days -hx uti -no fevers, NV, flank pain, vaginal discharge  ROS: See pertinent positives and negatives per HPI.  Past Medical History  Diagnosis Date  . SINUSITIS- ACUTE-NOS 04/03/2010  . HYPERTENSION 01/10/2007  . HYPOTHYROIDISM 11/02/2006  . HYPERLIPIDEMIA 01/10/2007  . Chronic maxillary sinusitis 10/05/2008  . ALLERGIC RHINITIS 11/02/2006  . GERD 11/02/2006  . NEPHROLITHIASIS, HX OF 01/10/2007  . Chronic neck pain 04/15/2011  . Cervical disc disease 04/15/2011  . Hyperlipidemia 01/10/2007    Qualifier: Diagnosis of  By: Jenny Reichmann MD, Hunt Oris     Past Surgical History  Procedure Laterality Date  . Abdominal hysterectomy  1996  . Tonsillectomy  1961  . S/p selective nerve root block 2002      per Dr. Louanne Skye to right neck    Family History  Problem Relation Age of Onset  . Hypertension Other   . Diabetes Other     Social History   Social History  . Marital Status: Married    Spouse Name: N/A  . Number of Children: 2  . Years of Education: N/A   Occupational History  . [employer]   Social History Main Topics  . Smoking status: Never Smoker   . Smokeless tobacco: Never Used  . Alcohol Use: No  . Drug Use: No  . Sexual Activity:    Partners: Male   Other Topics Concern  . None   Social History Narrative     Current outpatient prescriptions:  .  ALPRAZolam (XANAX) 0.5 MG tablet, , Disp: , Rfl: 2 .  amLODipine (NORVASC) 5 MG tablet, Take 1 tablet (5 mg total) by mouth daily., Disp: 90 tablet, Rfl: 3 .  aspirin EC 81 MG tablet, Take 1 tablet (81 mg total) by mouth daily., Disp: 90 tablet, Rfl: 11 .  estradiol (ESTRACE) 2 MG tablet, Take 2 mg by mouth daily., Disp: , Rfl:  .  fluticasone (FLONASE) 50 MCG/ACT nasal spray, Place 1 spray into both nostrils daily., Disp: 16 g, Rfl: 11 .  HYDROcodone-acetaminophen (NORCO/VICODIN) 5-325 MG per tablet, , Disp: , Rfl: 0 .  levothyroxine  (SYNTHROID, LEVOTHROID) 150 MCG tablet, TAKE 1 TABLET BY MOUTH DAILY BEFORE BREAKFAST, Disp: 90 tablet, Rfl: 2 .  lisinopril (PRINIVIL,ZESTRIL) 40 MG tablet, TAKE 1 TABLET BY MOUTH EVERY DAY, Disp: 90 tablet, Rfl: 3  EXAM:  Filed Vitals:   10/15/15 1358  BP: 112/80  Pulse: 81  Temp: 98.1 F (36.7 C)    Body mass index is 33.09 kg/(m^2).  GENERAL: vitals reviewed and listed above, alert, oriented, appears well hydrated and in no acute distress  HEENT: atraumatic, conjunttiva clear, no obvious abnormalities on inspection of external nose and ears  NECK: no obvious masses on inspection  LUNGS: clear to auscultation bilaterally, no wheezes, rales or rhonchi, good air movement  CV: HRRR, no peripheral edema  ABD: soft, NTTP, no cva TTP  MS: moves all extremities without noticeable abnormality  PSYCH: pleasant and cooperative, no obvious depression or anxiety  ASSESSMENT AND PLAN:  Discussed the following assessment and plan:  Dysuria - Plan: POC Urinalysis Dipstick  -ua c/w likely infection -opted to treat with macrobid after discussion risks -advised prompt re-eval if symptoms do not resolve, worsening or new symptoms   There are no Patient Instructions on file for this visit.  Colin Benton R., DO

## 2015-10-15 NOTE — Progress Notes (Signed)
Pre visit review using our clinic review tool, if applicable. No additional management support is needed unless otherwise documented below in the visit note. 

## 2016-05-04 ENCOUNTER — Other Ambulatory Visit: Payer: Self-pay | Admitting: Internal Medicine

## 2016-06-10 ENCOUNTER — Encounter: Payer: Self-pay | Admitting: Internal Medicine

## 2016-06-10 ENCOUNTER — Ambulatory Visit (INDEPENDENT_AMBULATORY_CARE_PROVIDER_SITE_OTHER): Payer: No Typology Code available for payment source | Admitting: Internal Medicine

## 2016-06-10 VITALS — BP 142/84 | HR 84 | Temp 98.8°F | Ht 66.5 in | Wt 206.0 lb

## 2016-06-10 DIAGNOSIS — Z0001 Encounter for general adult medical examination with abnormal findings: Secondary | ICD-10-CM

## 2016-06-10 DIAGNOSIS — M25551 Pain in right hip: Secondary | ICD-10-CM

## 2016-06-10 DIAGNOSIS — Z0000 Encounter for general adult medical examination without abnormal findings: Secondary | ICD-10-CM

## 2016-06-10 MED ORDER — LISINOPRIL 40 MG PO TABS: 40.0000 mg | ORAL_TABLET | Freq: Every day | ORAL | 3 refills | Status: DC

## 2016-06-10 MED ORDER — PREDNISONE 10 MG PO TABS: ORAL_TABLET | ORAL | 0 refills | Status: DC

## 2016-06-10 MED ORDER — LEVOTHYROXINE SODIUM 150 MCG PO TABS: 150.0000 ug | ORAL_TABLET | Freq: Every day | ORAL | 3 refills | Status: DC

## 2016-06-10 MED ORDER — HYDROCODONE-ACETAMINOPHEN 5-325 MG PO TABS: 1.0000 | ORAL_TABLET | Freq: Four times a day (QID) | ORAL | 0 refills | Status: DC | PRN

## 2016-06-10 MED ORDER — AMLODIPINE BESYLATE 5 MG PO TABS: 5.0000 mg | ORAL_TABLET | Freq: Every day | ORAL | 3 refills | Status: DC

## 2016-06-10 NOTE — Progress Notes (Signed)
Subjective:    Patient ID: [name], female    DOB: 03-17-1956, 61 y.o.   MRN: [id number]  HPI  Here for wellness and f/u;  Overall doing ok;  Pt denies Chest pain, worsening SOB, DOE, wheezing, orthopnea, PND, worsening LE edema, palpitations, dizziness or syncope.  Pt denies neurological change such as new headache, facial or extremity weakness.  Pt denies polydipsia, polyuria, or low sugar symptoms. Pt states overall good compliance with treatment and medications, good tolerability, and has been trying to follow appropriate diet.  Pt denies worsening depressive symptoms, suicidal ideation or panic. No fever, night sweats, wt loss, loss of appetite, or other constitutional symptoms.  Pt states good ability with ADL's, has low fall risk, home safety reviewed and adequate, no other significant changes in hearing or vision, and only occasionally active with exercise.  Here to f/u with c/o anterior right hip/groin pain with radiation to the knee level, ongoing x 1 mo but worse in the past wk, now severe, sharp and dull, intermittent, sitting usually does not hurt but will after prolonged sitting, is always worse with standing up and walking.  Did have some minor mid low back pain but does not think related, maybe related to walking differently.  Has not tried any otc meds.    Past Medical History:  Diagnosis Date  . ALLERGIC RHINITIS 11/02/2006  . Cervical disc disease 04/15/2011  . Chronic maxillary sinusitis 10/05/2008  . Chronic neck pain 04/15/2011  . GERD 11/02/2006  . HYPERLIPIDEMIA 01/10/2007  . Hyperlipidemia 01/10/2007   Qualifier: Diagnosis of  By: Jenny Reichmann MD, Hunt Oris   . HYPERTENSION 01/10/2007  . HYPOTHYROIDISM 11/02/2006  . NEPHROLITHIASIS, HX OF 01/10/2007  . SINUSITIS- ACUTE-NOS 04/03/2010   Past Surgical History:  Procedure Laterality Date  . ABDOMINAL HYSTERECTOMY  1996  . s/p selective nerve root block 2002     per Dr. Louanne Skye to right neck  . TONSILLECTOMY  1961    reports that  she has never smoked. She has never used smokeless tobacco. She reports that she does not drink alcohol or use drugs. family history includes Diabetes in her other; Hypertension in her other. No Known Allergies Current Outpatient Prescriptions on File Prior to Visit  Medication Sig Dispense Refill  . ALPRAZolam (XANAX) 0.5 MG tablet   2  . aspirin EC 81 MG tablet Take 1 tablet (81 mg total) by mouth daily. 90 tablet 11  . estradiol (ESTRACE) 2 MG tablet Take 2 mg by mouth daily.    . fluticasone (FLONASE) 50 MCG/ACT nasal spray Place 1 spray into both nostrils daily. 16 g 11   No current facility-administered medications on file prior to visit.     Review of Systems  Constitutional: Negative for unusual diaphoresis or night sweats HENT: Negative for ear swelling or discharge Eyes: Negative for worsening visual haziness  Respiratory: Negative for choking and stridor.   Gastrointestinal: Negative for distension or worsening eructation Genitourinary: Negative for retention or change in urine volume.  Musculoskeletal: Negative for other MSK pain or swelling Skin: Negative for color change and worsening wound Neurological: Negative for tremors and numbness other than noted  Psychiatric/Behavioral: Negative for decreased concentration or agitation other than above   All other system neg per pt    Objective:   Physical Exam BP (!) 142/84   Pulse 84   Temp 98.8 F (37.1 C)   Ht 5' 6.5" (1.689 m)   Wt 206 lb (93.4 kg)   SpO2  99%   BMI 32.75 kg/m  VS noted,  Constitutional: Pt appears in no apparent distress HENT: Head: NCAT.  Right Ear: External ear normal.  Left Ear: External ear normal.  Eyes: . Pupils are equal, round, and reactive to light. Conjunctivae and EOM are normal Neck: Normal range of motion. Neck supple.  Cardiovascular: Normal rate and regular rhythm.   Pulmonary/Chest: Effort normal and breath sounds without rales or wheezing.  Right hip with pain on passive  flexion limited by 20 degrees Neurological: Pt is alert. Not confused , motor grossly intact Skin: Skin is warm. No rash, no LE edema Psychiatric: Pt behavior is normal. No agitation.  All other system neg per pt    Assessment & Plan:

## 2016-06-10 NOTE — Patient Instructions (Signed)
Please take all new medication as prescribed - the pain medication, and prednisone  Please call for an appt with Dr Tamala Julian if not improved in 1 week  Please continue all other medications as before, and refills have been done if requested to [facility]  Please have the pharmacy call with any other refills you may need.  Please continue your efforts at being more active, low cholesterol diet, and weight control.  You are otherwise up to date with prevention measures today.  Please keep your appointments with your specialists as you may have planned  Please go to the XRAY Department in the Basement (go straight as you get off the elevator) for the x-ray testing at your convenience  Please go to the LAB in the Basement (turn left off the elevator) for the tests to be done at your convenience  You will be contacted by phone if any changes need to be made immediately.  Otherwise, you will receive a letter about your results with an explanation, but please check with MyChart first.  Please remember to sign up for MyChart if you have not done so, as this will be important to you in the future with finding out test results, communicating by private email, and scheduling acute appointments online when needed.  Please return in 1 year for your yearly visit, or sooner if needed, with Lab testing done 3-5 days before

## 2016-06-11 NOTE — Assessment & Plan Note (Signed)

## 2016-06-11 NOTE — Assessment & Plan Note (Signed)
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

## 2016-12-16 ENCOUNTER — Other Ambulatory Visit: Payer: No Typology Code available for payment source

## 2016-12-16 ENCOUNTER — Telehealth: Payer: Self-pay | Admitting: Internal Medicine

## 2016-12-16 DIAGNOSIS — Z0000 Encounter for general adult medical examination without abnormal findings: Secondary | ICD-10-CM

## 2016-12-16 NOTE — Telephone Encounter (Signed)
Pt came this morning for lab work but the lab orders were dated for 2019, she has not had a CPE for this year she had one scheduled for 9/20 but canceled it because of the lab orders. She would like to know if Jenny Reichmann wants her to come for a physical this year or wait til next year. She said when she was seen in march for her hip he went ahead and refilled her medication for a year. Please advise

## 2016-12-16 NOTE — Telephone Encounter (Signed)
Called pt, left a detailed msg with details below.

## 2016-12-16 NOTE — Telephone Encounter (Signed)
It appears that [name] did not have the labs done that were ordered in mar 2018 and now have timed out.  The 2019 labs were placed at the same time (in mar 2018) in preparation for a return visit about mar 2019  I have reordered labs for now in order not to have to wait further until 2019 for next labs, and she may feel she does not need ROV at this time

## 2016-12-24 ENCOUNTER — Encounter: Payer: No Typology Code available for payment source | Admitting: Internal Medicine

## 2017-03-09 ENCOUNTER — Telehealth: Payer: Self-pay | Admitting: Internal Medicine

## 2017-03-09 NOTE — Telephone Encounter (Signed)
Copied from [place] [id number]. Topic: Inquiry >> Mar 09, 2017  9:38 AM Neva Seat wrote:  Colon spams medication.  Pt needs this medication refilled.  She used to take this a while back.  Can't remember the name of medication

## 2017-03-11 NOTE — Telephone Encounter (Signed)
Called pt attempted to make an appt and pt stated she will call back to make one.

## 2017-03-11 NOTE — Telephone Encounter (Signed)
Patient called again to see if the prescription had been processed.  She was told it could take up to 72hrs to process.

## 2017-03-11 NOTE — Telephone Encounter (Signed)
Per chart no spasm medication on current med list, and pt was last seen back in 06/2016. She canceled her 6 month F/U which was back in Sept. Pt will need to make appt for re-evaluation on sxs.Pls contact pt to make an appt.[name]

## 2017-06-29 ENCOUNTER — Other Ambulatory Visit (INDEPENDENT_AMBULATORY_CARE_PROVIDER_SITE_OTHER): Payer: BLUE CROSS/BLUE SHIELD

## 2017-06-29 DIAGNOSIS — Z0000 Encounter for general adult medical examination without abnormal findings: Secondary | ICD-10-CM | POA: Diagnosis not present

## 2017-06-29 LAB — HEPATIC FUNCTION PANEL
ALT: 9 U/L (ref 0–35)
AST: 11 U/L (ref 0–37)
Albumin: 3.8 g/dL (ref 3.5–5.2)
Alkaline Phosphatase: 142 U/L — ABNORMAL HIGH (ref 39–117)
Bilirubin, Direct: 0.1 mg/dL (ref 0.0–0.3)
Total Bilirubin: 0.4 mg/dL (ref 0.2–1.2)
Total Protein: 7 g/dL (ref 6.0–8.3)

## 2017-06-29 LAB — URINALYSIS, ROUTINE W REFLEX MICROSCOPIC
Bilirubin Urine: NEGATIVE
Hgb urine dipstick: NEGATIVE
Ketones, ur: NEGATIVE
Nitrite: NEGATIVE
Specific Gravity, Urine: 1.025 (ref 1.000–1.030)
Total Protein, Urine: NEGATIVE
Urine Glucose: NEGATIVE
Urobilinogen, UA: 0.2 (ref 0.0–1.0)
pH: 6 (ref 5.0–8.0)

## 2017-06-29 LAB — BASIC METABOLIC PANEL
BUN: 15 mg/dL (ref 6–23)
CO2: 30 mEq/L (ref 19–32)
Calcium: 9.7 mg/dL (ref 8.4–10.5)
Chloride: 100 mEq/L (ref 96–112)
Creatinine, Ser: 0.72 mg/dL (ref 0.40–1.20)
GFR: 87.26 mL/min (ref >60.00)
Glucose, Bld: 101 mg/dL — ABNORMAL HIGH (ref 70–99)
Potassium: 4.9 mEq/L (ref 3.5–5.1)
Sodium: 138 mEq/L (ref 135–145)

## 2017-06-29 LAB — CBC WITH DIFFERENTIAL/PLATELET
Basophils Absolute: 0 10*3/uL (ref 0.0–0.1)
Basophils Relative: 0.5 % (ref 0.0–3.0)
Eosinophils Absolute: 0.2 10*3/uL (ref 0.0–0.7)
Eosinophils Relative: 2.4 % (ref 0.0–5.0)
HCT: 38.1 % (ref 36.0–46.0)
Hemoglobin: 12.8 g/dL (ref 12.0–15.0)
Lymphocytes Relative: 28.3 % (ref 12.0–46.0)
Lymphs Abs: 2.6 10*3/uL (ref 0.7–4.0)
MCHC: 33.7 g/dL (ref 30.0–36.0)
MCV: 86.7 fl (ref 78.0–100.0)
Monocytes Absolute: 0.6 10*3/uL (ref 0.1–1.0)
Monocytes Relative: 6.3 % (ref 3.0–12.0)
Neutro Abs: 5.7 10*3/uL (ref 1.4–7.7)
Neutrophils Relative %: 62.5 % (ref 43.0–77.0)
Platelets: 318 10*3/uL (ref 150.0–400.0)
RBC: 4.4 Mil/uL (ref 3.87–5.11)
RDW: 13.2 % (ref 11.5–15.5)
WBC: 9.1 10*3/uL (ref 4.0–10.5)

## 2017-06-29 LAB — LIPID PANEL
Cholesterol: 178 mg/dL (ref 0–200)
HDL: 59.6 mg/dL (ref >39.00)
LDL Cholesterol: 79 mg/dL (ref 0–99)
NonHDL: 118.28
Total CHOL/HDL Ratio: 3
Triglycerides: 197 mg/dL — ABNORMAL HIGH (ref 0.0–149.0)
VLDL: 39.4 mg/dL (ref 0.0–40.0)

## 2017-06-29 LAB — TSH: TSH: 0.97 u[IU]/mL (ref 0.35–4.50)

## 2017-06-30 ENCOUNTER — Ambulatory Visit (INDEPENDENT_AMBULATORY_CARE_PROVIDER_SITE_OTHER): Payer: BLUE CROSS/BLUE SHIELD | Admitting: Internal Medicine

## 2017-06-30 ENCOUNTER — Encounter: Payer: Self-pay | Admitting: Internal Medicine

## 2017-06-30 VITALS — BP 142/81 | HR 87 | Temp 98.1°F | Ht 66.5 in | Wt 193.0 lb

## 2017-06-30 DIAGNOSIS — Z0000 Encounter for general adult medical examination without abnormal findings: Secondary | ICD-10-CM

## 2017-06-30 DIAGNOSIS — M545 Low back pain, unspecified: Secondary | ICD-10-CM

## 2017-06-30 DIAGNOSIS — I10 Essential (primary) hypertension: Secondary | ICD-10-CM

## 2017-06-30 DIAGNOSIS — Z0001 Encounter for general adult medical examination with abnormal findings: Secondary | ICD-10-CM

## 2017-06-30 LAB — HEPATITIS C ANTIBODY
Hepatitis C Ab: NONREACTIVE
SIGNAL TO CUT-OFF: 0.05 (ref <1.00)

## 2017-06-30 MED ORDER — CYCLOBENZAPRINE HCL 5 MG PO TABS: 5.0000 mg | ORAL_TABLET | Freq: Three times a day (TID) | ORAL | 1 refills | Status: DC | PRN

## 2017-06-30 MED ORDER — LISINOPRIL 40 MG PO TABS: 40.0000 mg | ORAL_TABLET | Freq: Every day | ORAL | 3 refills | Status: DC

## 2017-06-30 MED ORDER — LEVOTHYROXINE SODIUM 150 MCG PO TABS
150.0000 ug | ORAL_TABLET | Freq: Every day | ORAL | 3 refills | Status: DC
Start: 2017-06-30 — End: 2017-10-14

## 2017-06-30 MED ORDER — AMLODIPINE BESYLATE 5 MG PO TABS: 5.0000 mg | ORAL_TABLET | Freq: Every day | ORAL | 3 refills | Status: DC

## 2017-06-30 NOTE — Patient Instructions (Addendum)
Please call if you change your mind about the colonoscopy  Please call for higher dose amlodipine if your average blood pressure at home are more 130/80  Please take all new medication as prescribed - the muscle relaxer  Please continue all other medications as before, and refills have been done if requested.  Please have the pharmacy call with any other refills you may need.  Please continue your efforts at being more active, low cholesterol diet, and weight control.  You are otherwise up to date with prevention measures today.  Please keep your appointments with your specialists as you may have planned  Please return in 1 year for your yearly visit, or sooner if needed, with Lab testing done 3-5 days before

## 2017-06-30 NOTE — Progress Notes (Signed)
Subjective:    Patient ID: [name], female    DOB: 1955/10/30, 62 y.o.   MRN: [id number]  HPI  Here for wellness and f/u;  Overall doing ok;  Pt denies Chest pain, worsening SOB, DOE, wheezing, orthopnea, PND, worsening LE edema, palpitations, dizziness or syncope.  Pt denies neurological change such as new headache, facial or extremity weakness.  Pt denies polydipsia, polyuria, or low sugar symptoms. Pt states overall good compliance with treatment and medications, good tolerability, and has been trying to follow appropriate diet.  Pt denies worsening depressive symptoms, suicidal ideation or panic. No fever, night sweats, wt loss, loss of appetite, or other constitutional symptoms.  Pt states good ability with ADL's, has low fall risk, home safety reviewed and adequate, no other significant changes in hearing or vision, and only occasionally active with exercise.  More stress recently with right ankle fx non healing last few months. Bp at home usually < 140/90 but not checked recently, will restart checking  Also c/o neck, whole arm, upper anterior legs and occasional LBP. Does push her husband about 300 lbs in wheelchair. Pain usually worse with first few steps but better after that. No other interval hx or new complaints Past Medical History:  Diagnosis Date  . ALLERGIC RHINITIS 11/02/2006  . Cervical disc disease 04/15/2011  . Chronic maxillary sinusitis 10/05/2008  . Chronic neck pain 04/15/2011  . GERD 11/02/2006  . HYPERLIPIDEMIA 01/10/2007  . Hyperlipidemia 01/10/2007   Qualifier: Diagnosis of  By: Jenny Reichmann MD, Hunt Oris   . HYPERTENSION 01/10/2007  . HYPOTHYROIDISM 11/02/2006  . NEPHROLITHIASIS, HX OF 01/10/2007  . SINUSITIS- ACUTE-NOS 04/03/2010   Past Surgical History:  Procedure Laterality Date  . ABDOMINAL HYSTERECTOMY  1996  . s/p selective nerve root block 2002     per Dr. Louanne Skye to right neck  . TONSILLECTOMY  1961    reports that she has never smoked. She has never used smokeless  tobacco. She reports that she does not drink alcohol or use drugs. family history includes Diabetes in her other; Hypertension in her other. No Known Allergies Current Outpatient Medications on File Prior to Visit  Medication Sig Dispense Refill  . ALPRAZolam (XANAX) 0.5 MG tablet   2  . aspirin EC 81 MG tablet Take 1 tablet (81 mg total) by mouth daily. 90 tablet 11  . estradiol (ESTRACE) 2 MG tablet Take 2 mg by mouth daily.    . fluticasone (FLONASE) 50 MCG/ACT nasal spray Place 1 spray into both nostrils daily. 16 g 11   No current facility-administered medications on file prior to visit.    Review of Systems Constitutional: Negative for other unusual diaphoresis, sweats, appetite or weight changes HENT: Negative for other worsening hearing loss, ear pain, facial swelling, mouth sores or neck stiffness.   Eyes: Negative for other worsening pain, redness or other visual disturbance.  Respiratory: Negative for other stridor or swelling Cardiovascular: Negative for other palpitations or other chest pain  Gastrointestinal: Negative for worsening diarrhea or loose stools, blood in stool, distention or other pain Genitourinary: Negative for hematuria, flank pain or other change in urine volume.  Musculoskeletal: Negative for myalgias or other joint swelling.  Skin: Negative for other color change, or other wound or worsening drainage.  Neurological: Negative for other syncope or numbness. Hematological: Negative for other adenopathy or swelling Psychiatric/Behavioral: Negative for hallucinations, other worsening agitation, SI, self-injury, or new decreased concentration All other system neg per pt    Objective:  Physical Exam BP (!) 142/81 (BP Location: Left Arm, Patient Position: Sitting, Cuff Size: Normal)   Pulse 87   Temp 98.1 F (36.7 C) (Oral)   Ht 5' 6.5" (1.689 m)   Wt 193 lb (87.5 kg)   SpO2 98%   BMI 30.68 kg/m  VS noted,  Constitutional: Pt is oriented to person,  place, and time. Appears well-developed and well-nourished, in no significant distress and comfortable Head: Normocephalic and atraumatic  Eyes: Conjunctivae and EOM are normal. Pupils are equal, round, and reactive to light Right Ear: External ear normal without discharge Left Ear: External ear normal without discharge Nose: Nose without discharge or deformity Mouth/Throat: Oropharynx is without other ulcerations and moist  Neck: Normal range of motion. Neck supple. No JVD present. No tracheal deviation present or significant neck LA or mass Cardiovascular: Normal rate, regular rhythm, normal heart sounds and intact distal pulses.   Pulmonary/Chest: WOB normal and breath sounds without rales or wheezing  Abdominal: Soft. Bowel sounds are normal. NT. No HSM  Musculoskeletal: Normal range of motion. Exhibits no edema Lymphadenopathy: Has no other cervical adenopathy.  Neurological: Pt is alert and oriented to person, place, and time. Pt has normal reflexes. No cranial nerve deficit. Motor grossly intact, Gait intact Skin: Skin is warm and dry. No rash noted or new ulcerations Psychiatric:  Has mild nervous mood and affect. Behavior is normal without agitation No other exam findings Lab Results  Component Value Date   WBC 9.1 06/29/2017   HGB 12.8 06/29/2017   HCT 38.1 06/29/2017   PLT 318.0 06/29/2017   GLUCOSE 101 (H) 06/29/2017   CHOL 178 06/29/2017   TRIG 197.0 (H) 06/29/2017   HDL 59.60 06/29/2017   LDLDIRECT 112.0 07/06/2014   LDLCALC 79 06/29/2017   ALT 9 06/29/2017   AST 11 06/29/2017   NA 138 06/29/2017   K 4.9 06/29/2017   CL 100 06/29/2017   CREATININE 0.72 06/29/2017   BUN 15 06/29/2017   CO2 30 06/29/2017   TSH 0.97 06/29/2017      Assessment & Plan:

## 2017-07-01 MED ORDER — AMLODIPINE BESYLATE 5 MG PO TABS: 5.0000 mg | ORAL_TABLET | Freq: Every day | ORAL | 3 refills | Status: DC

## 2017-07-03 NOTE — Assessment & Plan Note (Signed)
Mild elevated, o/w stable overall by history and exam, recent data reviewed with pt, and pt to continue medical treatment as before as declines med change, to f/u any worsening symptoms or concerns BP Readings from Last 3 Encounters:  06/30/17 (!) 142/81  06/10/16 (!) 142/84  10/15/15 112/80

## 2017-07-03 NOTE — Assessment & Plan Note (Addendum)
With other myalgias due to increased pushing wheelchair with obese husband, for flexeril prn

## 2017-07-03 NOTE — Assessment & Plan Note (Signed)

## 2017-10-14 ENCOUNTER — Ambulatory Visit: Payer: BLUE CROSS/BLUE SHIELD | Admitting: Internal Medicine

## 2017-10-14 ENCOUNTER — Other Ambulatory Visit (INDEPENDENT_AMBULATORY_CARE_PROVIDER_SITE_OTHER): Payer: BLUE CROSS/BLUE SHIELD

## 2017-10-14 ENCOUNTER — Encounter: Payer: Self-pay | Admitting: Internal Medicine

## 2017-10-14 ENCOUNTER — Other Ambulatory Visit: Payer: Self-pay | Admitting: Internal Medicine

## 2017-10-14 ENCOUNTER — Ambulatory Visit (INDEPENDENT_AMBULATORY_CARE_PROVIDER_SITE_OTHER)
Admission: RE | Admit: 2017-10-14 | Discharge: 2017-10-14 | Disposition: A | Discharge status: Home / Self Care | Payer: BLUE CROSS/BLUE SHIELD | Source: Ambulatory Visit | Attending: Internal Medicine | Admitting: Internal Medicine

## 2017-10-14 VITALS — BP 158/90 | HR 94 | Temp 98.3°F | Ht 66.5 in | Wt 191.0 lb

## 2017-10-14 DIAGNOSIS — G4700 Insomnia, unspecified: Secondary | ICD-10-CM | POA: Diagnosis not present

## 2017-10-14 DIAGNOSIS — M25512 Pain in left shoulder: Secondary | ICD-10-CM

## 2017-10-14 DIAGNOSIS — E049 Nontoxic goiter, unspecified: Secondary | ICD-10-CM

## 2017-10-14 DIAGNOSIS — R634 Abnormal weight loss: Secondary | ICD-10-CM

## 2017-10-14 DIAGNOSIS — M6289 Other specified disorders of muscle: Secondary | ICD-10-CM | POA: Insufficient documentation

## 2017-10-14 DIAGNOSIS — M25511 Pain in right shoulder: Secondary | ICD-10-CM

## 2017-10-14 LAB — BASIC METABOLIC PANEL
BUN: 13 mg/dL (ref 6–23)
CO2: 28 mEq/L (ref 19–32)
Calcium: 9.3 mg/dL (ref 8.4–10.5)
Chloride: 101 mEq/L (ref 96–112)
Creatinine, Ser: 0.73 mg/dL (ref 0.40–1.20)
GFR: 85.8 mL/min (ref >60.00)
Glucose, Bld: 100 mg/dL — ABNORMAL HIGH (ref 70–99)
Potassium: 3.8 mEq/L (ref 3.5–5.1)
Sodium: 137 mEq/L (ref 135–145)

## 2017-10-14 LAB — CBC WITH DIFFERENTIAL/PLATELET
Basophils Absolute: 0.1 10*3/uL (ref 0.0–0.1)
Basophils Relative: 0.8 % (ref 0.0–3.0)
Eosinophils Absolute: 0.2 10*3/uL (ref 0.0–0.7)
Eosinophils Relative: 2.3 % (ref 0.0–5.0)
HCT: 37.5 % (ref 36.0–46.0)
Hemoglobin: 12.4 g/dL (ref 12.0–15.0)
Lymphocytes Relative: 31.7 % (ref 12.0–46.0)
Lymphs Abs: 2.7 10*3/uL (ref 0.7–4.0)
MCHC: 33 g/dL (ref 30.0–36.0)
MCV: 85 fl (ref 78.0–100.0)
Monocytes Absolute: 0.5 10*3/uL (ref 0.1–1.0)
Monocytes Relative: 6.3 % (ref 3.0–12.0)
Neutro Abs: 5 10*3/uL (ref 1.4–7.7)
Neutrophils Relative %: 58.9 % (ref 43.0–77.0)
Platelets: 264 10*3/uL (ref 150.0–400.0)
RBC: 4.42 Mil/uL (ref 3.87–5.11)
RDW: 13.6 % (ref 11.5–15.5)
WBC: 8.5 10*3/uL (ref 4.0–10.5)

## 2017-10-14 LAB — HEPATIC FUNCTION PANEL
ALT: 11 U/L (ref 0–35)
AST: 13 U/L (ref 0–37)
Albumin: 4.2 g/dL (ref 3.5–5.2)
Alkaline Phosphatase: 124 U/L — ABNORMAL HIGH (ref 39–117)
Bilirubin, Direct: 0.1 mg/dL (ref 0.0–0.3)
Total Bilirubin: 0.2 mg/dL (ref 0.2–1.2)
Total Protein: 7.3 g/dL (ref 6.0–8.3)

## 2017-10-14 LAB — TSH: TSH: 0.24 u[IU]/mL — ABNORMAL LOW (ref 0.35–4.50)

## 2017-10-14 LAB — T4, FREE: Free T4: 0.93 ng/dL (ref 0.60–1.60)

## 2017-10-14 LAB — SEDIMENTATION RATE: Sed Rate: 41 mm/hr — ABNORMAL HIGH (ref 0–30)

## 2017-10-14 LAB — C-REACTIVE PROTEIN: CRP: 2.9 mg/dL (ref 0.5–20.0)

## 2017-10-14 LAB — CK: Total CK: 44 U/L (ref 7–177)

## 2017-10-14 MED ORDER — ZOLPIDEM TARTRATE 10 MG PO TABS: 10.0000 mg | ORAL_TABLET | Freq: Every evening | ORAL | 2 refills | Status: DC | PRN

## 2017-10-14 MED ORDER — TRAMADOL HCL 50 MG PO TABS: 50.0000 mg | ORAL_TABLET | Freq: Three times a day (TID) | ORAL | 0 refills | Status: DC | PRN

## 2017-10-14 MED ORDER — LEVOTHYROXINE SODIUM 125 MCG PO TABS: 125.0000 ug | ORAL_TABLET | Freq: Every day | ORAL | 3 refills | Status: DC

## 2017-10-14 NOTE — Assessment & Plan Note (Signed)
For TFT's, and thyroid u/s

## 2017-10-14 NOTE — Patient Instructions (Addendum)
Please take all new medication as prescribed - the pain medication, and sleep medication  Please continue all other medications as before, and refills have been done if requested.  Please have the pharmacy call with any other refills you may need.  Please continue your efforts at being more active, low cholesterol diet, and weight control.  You are otherwise up to date with prevention measures today.  Please keep your appointments with your specialists as you may have planned  You will be contacted regarding the referral for: Sports Medicine , and the thyroid ultrasound  We can consider Rheumatology depending on the results of the sports medicine and lab testing  Please go to the XRAY Department in the Basement (go straight as you get off the elevator) for the x-ray testing  Please go to the LAB in the Basement (turn left off the elevator) for the tests to be done today  You will be contacted by phone if any changes need to be made immediately.  Otherwise, you will receive a letter about your results with an explanation, but please check with MyChart first.  Please remember to sign up for MyChart if you have not done so, as this will be important to you in the future with finding out test results, communicating by private email, and scheduling acute appointments online when needed.

## 2017-10-14 NOTE — Assessment & Plan Note (Signed)
For mult labs as ordered, r/o polymyositis like illness

## 2017-10-14 NOTE — Assessment & Plan Note (Addendum)
Etiology unclear, also for sport med referral to r/o other cause  Note:  Total time for pt hx, exam, review of record with pt in the room, determination of diagnoses and plan for further eval and tx is > 40 min, with over 50% spent in coordination and counseling of patient including the differential dx, tx, further evaluation and other management of bilat shoulder pain, enlarged thyroid, insomnia, prox limb weakness, wt loss

## 2017-10-14 NOTE — Progress Notes (Signed)
Subjective:    Patient ID: [name], female    DOB: [birth year], 62 y.o.   MRN: [id number]  HPI  Here with 3 mo onset bilat upper arm and bilat upper leg (front and back) pain and mild subjective weakness, without other worsening neck or back pain;    Arm pain is worse to abduct, worse to fix hair and dressing/changing clothes  Also with General weakness and has dropped things unusual for her.  Has some shakiness with holding the coffee cup in the AM.  No falls.  Has had some wt loss, also ? Right thyroid swelling and recent onset mild insomnia last few weeks not clearly related to increased stress but could be.   Pt denies fever, night sweats, loss of appetite, or other constitutional symptoms Wt Readings from Last 3 Encounters:  10/14/17 191 lb (86.6 kg)  06/30/17 193 lb (87.5 kg)  06/10/16 206 lb (93.4 kg)   Past Medical History:  Diagnosis Date  . ALLERGIC RHINITIS 11/02/2006  . Cervical disc disease 04/15/2011  . Chronic maxillary sinusitis 10/05/2008  . Chronic neck pain 04/15/2011  . GERD 11/02/2006  . HYPERLIPIDEMIA 01/10/2007  . Hyperlipidemia 01/10/2007   Qualifier: Diagnosis of  By: Jenny Reichmann MD, Hunt Oris   . HYPERTENSION 01/10/2007  . HYPOTHYROIDISM 11/02/2006  . NEPHROLITHIASIS, HX OF 01/10/2007  . SINUSITIS- ACUTE-NOS 04/03/2010   Past Surgical History:  Procedure Laterality Date  . ABDOMINAL HYSTERECTOMY  1996  . s/p selective nerve root block 2002     per Dr. Louanne Skye to right neck  . TONSILLECTOMY  1961    reports that she has never smoked. She has never used smokeless tobacco. She reports that she does not drink alcohol or use drugs. family history includes Diabetes in her other; Hypertension in her other. No Known Allergies Current Outpatient Medications on File Prior to Visit  Medication Sig Dispense Refill  . ALPRAZolam (XANAX) 0.5 MG tablet   2  . amLODipine (NORVASC) 5 MG tablet Take 1 tablet (5 mg total) by mouth daily. 90 tablet 3  . aspirin EC 81 MG tablet Take 1  tablet (81 mg total) by mouth daily. 90 tablet 11  . cyclobenzaprine (FLEXERIL) 5 MG tablet Take 1 tablet (5 mg total) by mouth 3 (three) times daily as needed for muscle spasms. 60 tablet 1  . estradiol (ESTRACE) 2 MG tablet Take 2 mg by mouth daily.    . fluticasone (FLONASE) 50 MCG/ACT nasal spray Place 1 spray into both nostrils daily. 16 g 11  . lisinopril (PRINIVIL,ZESTRIL) 40 MG tablet Take 1 tablet (40 mg total) by mouth daily. 90 tablet 3   No current facility-administered medications on file prior to visit.    Review of Systems  Constitutional: Negative for other unusual diaphoresis or sweats HENT: Negative for ear discharge or swelling Eyes: Negative for other worsening visual disturbances Respiratory: Negative for stridor or other swelling  Gastrointestinal: Negative for worsening distension or other blood Genitourinary: Negative for retention or other urinary change Musculoskeletal: Negative for other MSK pain or swelling Skin: Negative for color change or other new lesions Neurological: Negative for worsening tremors and other numbness  Psychiatric/Behavioral: Negative for worsening agitation or other fatigue All other system neg per pt    Objective:   Physical Exam BP (!) 158/90 (BP Location: Left Arm, Patient Position: Sitting, Cuff Size: Normal)   Pulse 94   Temp 98.3 F (36.8 C) (Oral)   Ht 5' 6.5" (1.689 m)  Wt 191 lb (86.6 kg)   SpO2 97%   BMI 30.37 kg/m  VS noted, fatigued, not ill appearing Constitutional: Pt appears in NAD HENT: Head: NCAT.  Right Ear: External ear normal.  Left Ear: External ear normal.  Eyes: . Pupils are equal, round, and reactive to light. Conjunctivae and EOM are normal Nose: without d/c or deformity Neck: Neck supple. Gross normal ROM; thyroid with ? Right nodule enlarged Cardiovascular: Normal rate and regular rhythm.   Pulmonary/Chest: Effort normal and breath sounds without rales or wheezing.  Abd:  Soft, NT, ND, + BS, no  organomegaly Neurological: Pt is alert. At baseline orientation, motor intact except for ? Mild prox decreased strength Skin: Skin is warm. No rashes, other new lesions, no LE edema Psychiatric: Pt behavior is normal without agitation  No other exam findings      Assessment & Plan:

## 2017-10-14 NOTE — Assessment & Plan Note (Signed)
Also for cxr,  to f/u any worsening symptoms or concerns 

## 2017-10-14 NOTE — Assessment & Plan Note (Signed)
For ambien qhs prn 

## 2017-10-15 ENCOUNTER — Telehealth: Payer: Self-pay | Admitting: Internal Medicine

## 2017-10-15 LAB — RNP ANTIBODIES: ENA RNP Ab: <0.2 AI (ref 0.0–0.9)

## 2017-10-15 NOTE — Telephone Encounter (Signed)
I have scheduled patient for Dr. Thompson Caul next available in August.  There was an opening on Friday the 19th but patient stated she could not come in that day.  This was an urgent referral from Dr. Jenny Reichmann.  Patient is requesting a call back with another possible workin date and time.

## 2017-10-18 ENCOUNTER — Telehealth: Payer: Self-pay

## 2017-10-18 LAB — ALDOLASE: Aldolase: 3.4 U/L (ref <=8.1)

## 2017-10-18 LAB — ANTI-NUCLEAR AB-TITER (ANA TITER): ANA Titer 1: 1:80 {titer} — ABNORMAL HIGH

## 2017-10-18 LAB — SJOGRENS SYNDROME-A EXTRACTABLE NUCLEAR ANTIBODY: SSA (Ro) (ENA) Antibody, IgG: <1 AI

## 2017-10-18 LAB — ANA: Anti Nuclear Antibody(ANA): POSITIVE — AB

## 2017-10-18 LAB — JO-1 ANTIBODY-IGG: Jo-1 Autoabs: <1 AI

## 2017-10-18 LAB — ANTI-SMOOTH MUSCLE ANTIBODY, IGG: Actin (Smooth Muscle) Antibody (IGG): <20 U (ref <20)

## 2017-10-18 NOTE — Telephone Encounter (Signed)
-----   Message from Biagio Borg, MD sent at 10/14/2017  5:43 PM EDT ----- Left message on MyChart, pt to cont same tx except  The test results show that your current treatment is OK, except the thyroid test is off meaning you are now on a bit too much medication We need to decrease the thyroid medication from 150 to 125 per day.  There are several other blood tests still pending that will take several days.    [name] to please inform pt, I will do rx

## 2017-10-18 NOTE — Telephone Encounter (Signed)
Pt has viewed results via MyChart  

## 2017-10-18 NOTE — Telephone Encounter (Signed)
Left message to call back for appointment with Dr. Tamala Julian.

## 2017-10-19 ENCOUNTER — Telehealth: Payer: Self-pay

## 2017-10-19 DIAGNOSIS — R768 Other specified abnormal immunological findings in serum: Secondary | ICD-10-CM

## 2017-10-19 NOTE — Telephone Encounter (Signed)
Ok, this is done 

## 2017-10-19 NOTE — Addendum Note (Signed)
Addended by: Biagio Borg on: 10/19/2017 06:25 PM   Modules accepted: Orders

## 2017-10-19 NOTE — Telephone Encounter (Signed)
Copied from [place] [id number]. Topic: General - Other >> Oct 19, 2017  1:08 PM Adelene Idler wrote: Pt wants to know if she should be referred to a rheumatologist instead of sports med doctor  CB# [phone]

## 2017-10-21 ENCOUNTER — Encounter: Payer: Self-pay | Admitting: Internal Medicine

## 2017-10-25 NOTE — Telephone Encounter (Signed)
I faxed referral to [facility] today. It looks like pt also has questions for Dr. Jenny Reichmann

## 2017-11-05 NOTE — Progress Notes (Signed)
[name] [name] [name], [name] [address] Phone: [phone] Subjective:    CC: Shoulder pain  VPX:[id number]  [name] is a 62 y.o. female coming in with complaint of polyarthralgia. She notes pain in both legs and pain in upper arms. She notes that when she extends her elbow she feels like her muscles are "tearing." Notes pain in the tricep. Feels this pain at rest as well. Did have to care for husband pushing him in wheel chair and carrying around ramps.   Patient notes that she when stands up after sitting she notices pain in the hips. She feels both legs are weak. Denies any numbness or tingling. Did have a fall a month or so ago where she tripped at a restaurant. No other incidences of giving out. Feels unsteady on stairs and that her gait is not smooth. Has tried Aleve and Tramadol for pain. Has not seen a decrease in pain using either.       Past Medical History:  Diagnosis Date  . ALLERGIC RHINITIS 11/02/2006  . Cervical disc disease 04/15/2011  . Chronic maxillary sinusitis 10/05/2008  . Chronic neck pain 04/15/2011  . GERD 11/02/2006  . HYPERLIPIDEMIA 01/10/2007  . Hyperlipidemia 01/10/2007   Qualifier: Diagnosis of  By: Jenny Reichmann MD, Hunt Oris   . HYPERTENSION 01/10/2007  . HYPOTHYROIDISM 11/02/2006  . NEPHROLITHIASIS, HX OF 01/10/2007  . SINUSITIS- ACUTE-NOS 04/03/2010   Past Surgical History:  Procedure Laterality Date  . ABDOMINAL HYSTERECTOMY  1996  . s/p selective nerve root block 2002     per Dr. Louanne Skye to right neck  . TONSILLECTOMY  1961   Social History   Socioeconomic History  . Marital status: Married    Spouse name: Not on file  . Number of children: 2  . Years of education: Not on file  . Highest education level: Not on file  Occupational History  . Occupation: ELIGIBILITY    Employer: [employer]  . Financial resource strain: Not on file  . Food insecurity:    Worry: Not on file    Inability:  Not on file  . Transportation needs:    Medical: Not on file    Non-medical: Not on file  Tobacco Use  . Smoking status: Never Smoker  . Smokeless tobacco: Never Used  Substance and Sexual Activity  . Alcohol use: No  . Drug use: No  . Sexual activity: Yes    Partners: Male  Lifestyle  . Physical activity:    Days per week: Not on file    Minutes per session: Not on file  . Stress: Not on file  Relationships  . Social connections:    Talks on phone: Not on file    Gets together: Not on file    Attends religious service: Not on file    Active member of club or organization: Not on file    Attends meetings of clubs or organizations: Not on file    Relationship status: Not on file  Other Topics Concern  . Not on file  Social History Narrative  . Not on file   No Known Allergies Family History  Problem Relation Age of Onset  . Hypertension Other   . Diabetes Other      Past medical history, social, surgical and family history all reviewed in electronic medical record.  No pertanent information unless stated regarding to the chief complaint.   Review of Systems:Review of systems  updated and as accurate as of 11/08/17  No headache, visual changes, nausea, vomiting, diarrhea, constipation, dizziness, abdominal pain, skin rash, fevers, chills, night sweats, weight loss, swollen lymph nodes,  joint swelling,  chest pain, shortness of breath, mood changes.  Positive muscle aches, body aches  Objective  Blood pressure 140/72, pulse 64, height 5' 6.5" (1.689 m), weight 188 lb (85.3 kg), SpO2 98 %. Systems examined below as of 11/08/17   General: No apparent distress alert and oriented x3 mood and affect normal, dressed appropriately.  HEENT: Pupils equal, extraocular movements intact  Respiratory: Patient's speak in full sentences and does not appear short of breath  Cardiovascular: Trace lower extremity edema, non tender, no erythema  Skin: Warm dry intact with no signs of  infection or rash on extremities or on axial skeleton.  Abdomen: Soft nontender  Neuro: Cranial nerves II through XII are intact, neurovascularly intact in all extremities with 1+ DTRs and 2+ pulses.  Lymph: No lymphadenopathy of posterior or anterior cervical chain or axillae bilaterally.  Gait antalgic MSK:  tender with  Mild loss range of motion  and symmetric strength and tone of  elbows, wrist, hip, knee and ankles bilaterally.  Bilateral shoulder exam shows the patient has flexion of 90 degrees actively but is unable to go any higher.  Seems to be more significant weakness noted.  Patient's hips also show that we have 3+ out of 5 strength of the hip flexors bilaterally.  Patient has fairly good range of motion though otherwise.  Neck exam does show some mild positive cervical radiculopathy.  History of having this she states.  Grip strength was 5 out of 5.     Impression and Recommendations:     This case required medical decision making of moderate complexity.      Note: This dictation was prepared with Dragon dictation along with smaller phrase technology. Any transcriptional errors that result from this process are unintentional.

## 2017-11-08 ENCOUNTER — Ambulatory Visit: Payer: BLUE CROSS/BLUE SHIELD | Admitting: Family Medicine

## 2017-11-08 ENCOUNTER — Encounter: Payer: Self-pay | Admitting: Family Medicine

## 2017-11-08 ENCOUNTER — Ambulatory Visit (INDEPENDENT_AMBULATORY_CARE_PROVIDER_SITE_OTHER)
Admission: RE | Admit: 2017-11-08 | Discharge: 2017-11-08 | Disposition: A | Discharge status: Home / Self Care | Payer: BLUE CROSS/BLUE SHIELD | Source: Ambulatory Visit | Attending: Family Medicine | Admitting: Family Medicine

## 2017-11-08 VITALS — BP 140/72 | HR 64 | Ht 66.5 in | Wt 188.0 lb

## 2017-11-08 DIAGNOSIS — M542 Cervicalgia: Secondary | ICD-10-CM

## 2017-11-08 DIAGNOSIS — M6289 Other specified disorders of muscle: Secondary | ICD-10-CM | POA: Diagnosis not present

## 2017-11-08 MED ORDER — PREDNISONE 20 MG PO TABS: 20.0000 mg | ORAL_TABLET | Freq: Every day | ORAL | 0 refills | Status: DC

## 2017-11-08 MED ORDER — VITAMIN D (ERGOCALCIFEROL) 1.25 MG (50000 UNIT) PO CAPS: 50000.0000 [IU] | ORAL_CAPSULE | ORAL | 0 refills | Status: DC

## 2017-11-08 NOTE — Patient Instructions (Addendum)
Good to see you  xrays downstairs Ice 20 minutes 2 times daily. Usually after activity and before bed. Once weekly vitamin D for 12 weeks(new prescription) Turmeric 500mg  daily  Tart cherry extract any dose at night CoQ10 400mg  daily  prednisone daily for 10 days as well to help  See me again in 4 weeks

## 2017-11-08 NOTE — Assessment & Plan Note (Signed)
I agree with the proximal muscle weakness.  It appears to be significant at the shoulder girdle.  Patient is not on any medications that likely would be contributing to this.  We discussed icing regimen and home exercises.  We discussed which activities to do which wants to avoid.  Increase activity as tolerated.  Patient wanted to try other medications.  History of thyroid disease but has been well controlled recently.  Do not feel further autoimmune work-up would be beneficial at this point.  Patient will follow-up with me again in 4 weeks.

## 2017-11-30 ENCOUNTER — Ambulatory Visit: Payer: BLUE CROSS/BLUE SHIELD | Admitting: Family Medicine

## 2017-12-08 ENCOUNTER — Ambulatory Visit: Payer: BLUE CROSS/BLUE SHIELD | Admitting: Family Medicine

## 2018-03-03 ENCOUNTER — Other Ambulatory Visit: Payer: Self-pay | Admitting: Internal Medicine

## 2018-03-07 NOTE — Telephone Encounter (Signed)
   LOV: 10/14/17 NextOV:n/s Last Filled/Quantity:01/31/18  30#

## 2018-03-07 NOTE — Telephone Encounter (Signed)
Done erx 

## 2018-04-29 ENCOUNTER — Other Ambulatory Visit (INDEPENDENT_AMBULATORY_CARE_PROVIDER_SITE_OTHER): Payer: PRIVATE HEALTH INSURANCE

## 2018-04-29 ENCOUNTER — Ambulatory Visit: Payer: PRIVATE HEALTH INSURANCE | Admitting: Internal Medicine

## 2018-04-29 ENCOUNTER — Encounter: Payer: Self-pay | Admitting: Internal Medicine

## 2018-04-29 VITALS — BP 124/76 | HR 88 | Temp 98.3°F | Ht 66.5 in | Wt 200.0 lb

## 2018-04-29 DIAGNOSIS — N10 Acute tubulo-interstitial nephritis: Secondary | ICD-10-CM

## 2018-04-29 DIAGNOSIS — I10 Essential (primary) hypertension: Secondary | ICD-10-CM | POA: Diagnosis not present

## 2018-04-29 DIAGNOSIS — N200 Calculus of kidney: Secondary | ICD-10-CM

## 2018-04-29 LAB — URINALYSIS, ROUTINE W REFLEX MICROSCOPIC
Bilirubin Urine: NEGATIVE
Nitrite: POSITIVE — AB
Specific Gravity, Urine: 1.02 (ref 1.000–1.030)
Total Protein, Urine: >=300 — AB
Urine Glucose: NEGATIVE
Urobilinogen, UA: 1 (ref 0.0–1.0)
pH: 7 (ref 5.0–8.0)

## 2018-04-29 MED ORDER — CIPROFLOXACIN HCL 500 MG PO TABS: 500.0000 mg | ORAL_TABLET | Freq: Two times a day (BID) | ORAL | 0 refills | Status: AC

## 2018-04-29 MED ORDER — ONDANSETRON HCL 4 MG PO TABS: 4.0000 mg | ORAL_TABLET | Freq: Three times a day (TID) | ORAL | 1 refills | Status: DC | PRN

## 2018-04-29 MED ORDER — CEFTRIAXONE SODIUM 1 G IJ SOLR
1.0000 g | Freq: Once | INTRAMUSCULAR | Status: AC
  Administered 2018-04-29: 1 g via INTRAMUSCULAR

## 2018-04-29 NOTE — Progress Notes (Signed)
Subjective:    Patient ID: [name], female    DOB: [birth year], 63 y.o.   MRN: [id number]  HPI   Here to f/u with c/o rather sudden onset middle of the night last night symptoms of :Nausea, HA, left lower side and left flank pain, and gross hemturia, with feeling warm but no high fever, chills, vomiting. May have some mild urgency and frequency as well, but not really dysuria..  Feels tired but not overly sick.  Has hx of renal stone, seen per [facility] 0240, no hx of complicated stones and was able to pass left stone at the time in fragments.  Pt denies chest pain, increased sob or doe, wheezing, orthopnea, PND, increased LE swelling, palpitations, dizziness or syncope.   Pt denies polydipsia, polyuria Past Medical History:  Diagnosis Date  . ALLERGIC RHINITIS 11/02/2006  . Cervical disc disease 04/15/2011  . Chronic maxillary sinusitis 10/05/2008  . Chronic neck pain 04/15/2011  . GERD 11/02/2006  . HYPERLIPIDEMIA 01/10/2007  . Hyperlipidemia 01/10/2007   Qualifier: Diagnosis of  By: Jenny Reichmann MD, Hunt Oris   . HYPERTENSION 01/10/2007  . HYPOTHYROIDISM 11/02/2006  . NEPHROLITHIASIS, HX OF 01/10/2007  . SINUSITIS- ACUTE-NOS 04/03/2010   Past Surgical History:  Procedure Laterality Date  . ABDOMINAL HYSTERECTOMY  1996  . s/p selective nerve root block 2002     per Dr. Louanne Skye to right neck  . TONSILLECTOMY  1961    reports that she has never smoked. She has never used smokeless tobacco. She reports that she does not drink alcohol or use drugs. family history includes Diabetes in an other family member; Hypertension in an other family member. No Known Allergies Current Outpatient Medications on File Prior to Visit  Medication Sig Dispense Refill  . ALPRAZolam (XANAX) 0.5 MG tablet   2  . amLODipine (NORVASC) 5 MG tablet Take 1 tablet (5 mg total) by mouth daily. 90 tablet 3  . aspirin EC 81 MG tablet Take 1 tablet (81 mg total) by mouth daily. 90 tablet 11  . cyclobenzaprine (FLEXERIL) 5 MG  tablet Take 1 tablet (5 mg total) by mouth 3 (three) times daily as needed for muscle spasms. 60 tablet 1  . estradiol (ESTRACE) 2 MG tablet Take 2 mg by mouth daily.    . fluticasone (FLONASE) 50 MCG/ACT nasal spray Place 1 spray into both nostrils daily. 16 g 11  . levothyroxine (SYNTHROID, LEVOTHROID) 125 MCG tablet Take 1 tablet (125 mcg total) by mouth daily. 90 tablet 3  . lisinopril (PRINIVIL,ZESTRIL) 40 MG tablet Take 1 tablet (40 mg total) by mouth daily. 90 tablet 3  . predniSONE (DELTASONE) 20 MG tablet Take 1 tablet (20 mg total) by mouth daily with breakfast. 10 tablet 0  . traMADol (ULTRAM) 50 MG tablet Take 1 tablet (50 mg total) by mouth every 8 (eight) hours as needed. 30 tablet 0  . Vitamin D, Ergocalciferol, (DRISDOL) 50000 units CAPS capsule Take 1 capsule (50,000 Units total) by mouth every 7 (seven) days. 12 capsule 0  . zolpidem (AMBIEN) 10 MG tablet TAKE 1 TABLET(10 MG) BY MOUTH AT BEDTIME AS NEEDED FOR SLEEP 30 tablet 5   No current facility-administered medications on file prior to visit.    Review of Systems  Constitutional: Negative for other unusual diaphoresis or sweats HENT: Negative for ear discharge or swelling Eyes: Negative for other worsening visual disturbances Respiratory: Negative for stridor or other swelling  Gastrointestinal: Negative for worsening distension or other blood Genitourinary:  Negative for retention or other urinary change Musculoskeletal: Negative for other MSK pain or swelling Skin: Negative for color change or other new lesions Neurological: Negative for worsening tremors and other numbness  Psychiatric/Behavioral: Negative for worsening agitation or other fatigue All other system neg per pt    Objective:   Physical Exam BP 124/76   Pulse 88   Temp 98.3 F (36.8 C) (Oral)   Ht 5' 6.5" (1.689 m)   Wt 200 lb (90.7 kg)   SpO2 97%   BMI 31.80 kg/m  VS noted, mild to mod ill appaering Constitutional: Pt appears in NAD HENT:  Head: NCAT.  Right Ear: External ear normal.  Left Ear: External ear normal.  Eyes: . Pupils are equal, round, and reactive to light. Conjunctivae and EOM are normal Nose: without d/c or deformity Neck: Neck supple. Gross normal ROM Cardiovascular: Normal rate and regular rhythm.   Pulmonary/Chest: Effort normal and breath sounds without rales or wheezing.  Abd:  Soft, ND, + BS, no organomegaly, + mild low mid abd tender, and left flank tender Neurological: Pt is alert. At baseline orientation, motor grossly intact Skin: Skin is warm. No rashes, other new lesions, no LE edema Psychiatric: Pt behavior is normal without agitation  No other exam findings Lab Results  Component Value Date   WBC 8.5 10/14/2017   HGB 12.4 10/14/2017   HCT 37.5 10/14/2017   PLT 264.0 10/14/2017   GLUCOSE 100 (H) 10/14/2017   CHOL 178 06/29/2017   TRIG 197.0 (H) 06/29/2017   HDL 59.60 06/29/2017   LDLDIRECT 112.0 07/06/2014   LDLCALC 79 06/29/2017   ALT 11 10/14/2017   AST 13 10/14/2017   NA 137 10/14/2017   K 3.8 10/14/2017   CL 101 10/14/2017   CREATININE 0.73 10/14/2017   BUN 13 10/14/2017   CO2 28 10/14/2017   TSH 0.24 (L) 10/14/2017       Assessment & Plan:

## 2018-04-29 NOTE — Assessment & Plan Note (Signed)
Has hx of left renal stone, consider renal u/s and or CT to r/o obstruction, recurrent stone, consider urology referral

## 2018-04-29 NOTE — Assessment & Plan Note (Signed)
stable overall by history and exam, recent data reviewed with pt, and pt to continue medical treatment as before,  to f/u any worsening symptoms or concerns  

## 2018-04-29 NOTE — Patient Instructions (Addendum)
You had the antibiotic shot today - rocephin  Please take all new medication as prescribed - the ciprofloxocin, and zofran for nausea if needed  Please continue all other medications as before, and refills have been done if requested.  Please have the pharmacy call with any other refills you may need.  Please keep your appointments with your specialists as you may have planned  Please go to the LAB in the Basement (turn left off the elevator) for the tests to be done today - just the urine testing today  You will be contacted by phone if any changes need to be made immediately.  Otherwise, you will receive a letter about your results with an explanation, but please check with MyChart first.  Please remember to sign up for MyChart if you have not done so, as this will be important to you in the future with finding out test results, communicating by private email, and scheduling acute appointments online when needed.

## 2018-04-29 NOTE — Assessment & Plan Note (Addendum)
Clinical diagnosis, for urine studies, give rocephin 1 gm IM, cipro course pending culture results, to f/u any worsening symptoms or concerns

## 2018-05-01 LAB — URINE CULTURE
MICRO NUMBER:: 101202
SPECIMEN QUALITY:: ADEQUATE

## 2018-06-22 ENCOUNTER — Other Ambulatory Visit: Payer: Self-pay | Admitting: Internal Medicine

## 2018-09-14 ENCOUNTER — Ambulatory Visit: Payer: Self-pay

## 2018-09-14 NOTE — Telephone Encounter (Signed)
Patient called to discuss her numbness and tingling that she sent to the office via [facility]. She says this is an ongoing problem for her, sometimes it's both arms, but today it's the left arm. She has numbness and tingling down the arm to her fingers and pain at a 5. She says it's been ongoing off and on for a couple of weeks, but today it's been pretty constant. She denies weakness to the arm, denies slurred speech, denies blurry vision or vision changes, denies being unsteady on her feet, denies a headache today and denies dizziness. I advised someone from the office will call tomorrow to schedule a virtual visit, care advice given, she verbalized understanding and asks to be called on her cell number [phone].  Reason for Disposition . [1] Numbness or tingling in one or both hands AND [2] is a chronic symptom (recurrent or ongoing AND present > 4 weeks)  Answer Assessment - Initial Assessment Questions 1. SYMPTOM: "What is the main symptom you are concerned about?" (e.g., weakness, numbness)     Numbness and tingling down the left arm 2. ONSET: "When did this start?" (minutes, hours, days; while sleeping)     Couple of weeks 3. LAST NORMAL: "When was the last time you were normal (no symptoms)?"     This is an ongoing problem 4. PATTERN "Does this come and go, or has it been constant since it started?"  "Is it present now?"     Come and go; yes, present now all day 5. CARDIAC SYMPTOMS: "Have you had any of the following symptoms: chest pain, difficulty breathing, palpitations?"     No 6. NEUROLOGIC SYMPTOMS: "Have you had any of the following symptoms: headache, dizziness, vision loss, double vision, changes in speech, unsteady on your feet?"     No 7. OTHER SYMPTOMS: "Do you have any other symptoms?"     No 8. PREGNANCY: "Is there any chance you are pregnant?" "When was your last menstrual period?"     No  Protocols used: NEUROLOGIC DEFICIT-A-AH

## 2018-09-15 ENCOUNTER — Other Ambulatory Visit: Payer: Self-pay

## 2018-09-15 ENCOUNTER — Encounter: Payer: Self-pay | Admitting: Internal Medicine

## 2018-09-15 ENCOUNTER — Ambulatory Visit (INDEPENDENT_AMBULATORY_CARE_PROVIDER_SITE_OTHER): Payer: PRIVATE HEALTH INSURANCE | Admitting: Internal Medicine

## 2018-09-15 DIAGNOSIS — M5412 Radiculopathy, cervical region: Secondary | ICD-10-CM | POA: Insufficient documentation

## 2018-09-15 DIAGNOSIS — I10 Essential (primary) hypertension: Secondary | ICD-10-CM

## 2018-09-15 DIAGNOSIS — E039 Hypothyroidism, unspecified: Secondary | ICD-10-CM | POA: Diagnosis not present

## 2018-09-15 MED ORDER — CYCLOBENZAPRINE HCL 5 MG PO TABS: 5.0000 mg | ORAL_TABLET | Freq: Three times a day (TID) | ORAL | 1 refills | Status: DC | PRN

## 2018-09-15 MED ORDER — GABAPENTIN 100 MG PO CAPS: 100.0000 mg | ORAL_CAPSULE | Freq: Three times a day (TID) | ORAL | 3 refills | Status: DC

## 2018-09-15 MED ORDER — HYDROCODONE-ACETAMINOPHEN 5-325 MG PO TABS: 1.0000 | ORAL_TABLET | Freq: Four times a day (QID) | ORAL | 0 refills | Status: DC | PRN

## 2018-09-15 NOTE — Telephone Encounter (Signed)
LVM for patient to call back to schedule an appointment.

## 2018-09-15 NOTE — Assessment & Plan Note (Signed)
mod, for hydrocodone prn, gabapentin 100 tid, and flexeril trial prn, to f/u any worsening symptoms or concerns, consider MRI and/or f/u pain management Dr Gloris Manchester

## 2018-09-15 NOTE — Patient Instructions (Signed)
Please take all new medication as prescribed - the hydrocodone as needed, muscle relaxer, and gabapentin as directed  Please continue all other medications as before, and refills have been done if requested.  Please have the pharmacy call with any other refills you may need.  Please continue your efforts at being more active, low cholesterol diet, and weight control.  Please keep your appointments with your specialists as you may have planned  If not better, we may need to consider MRI and/or follow up with Dr Nelva Bush

## 2018-09-15 NOTE — Assessment & Plan Note (Signed)
stable overall by history and exam, recent data reviewed with pt, and pt to continue medical treatment as before,  to f/u any worsening symptoms or concerns  

## 2018-09-15 NOTE — Telephone Encounter (Signed)
Noted  

## 2018-09-15 NOTE — Progress Notes (Signed)
Patient ID: [name], female   DOB: 1955/08/31, 63 y.o.   MRN: [id number]  Virtual Visit via Video Note  I connected with Frederic Jericho on 09/15/18 at  2:00 PM EDT by a video enabled telemedicine application and verified that I am speaking with the correct person using two identifiers.  Location: Patient: at home Provider: at office   I discussed the limitations of evaluation and management by telemedicine and the availability of in person appointments. The patient expressed understanding and agreed to proceed.  History of Present Illness: Here to f/u with c/o left neck and LUE numbness and tingling and discomfort x 2 wks mod intermittent, nothing makes better or worse.  Has known hx of cervical disc dz and new dx of PMR 2019 with long tapering prednisone per rheumatology, still on 10 mg prednisone now.   Pt denies chest pain, increased sob or doe, wheezing, orthopnea, PND, increased LE swelling, palpitations, dizziness or syncope.  Pt denies other new neurological symptoms such as new headache, or facial weakness or numbness.   Pt denies fever, wt loss, night sweats, loss of appetite, or other constitutional symptoms Denies hyper or hypo thyroid symptoms such as voice, skin or hair change.  BP at home usually < 140/90 Past Medical History:  Diagnosis Date  . ALLERGIC RHINITIS 11/02/2006  . Cervical disc disease 04/15/2011  . Chronic maxillary sinusitis 10/05/2008  . Chronic neck pain 04/15/2011  . GERD 11/02/2006  . HYPERLIPIDEMIA 01/10/2007  . Hyperlipidemia 01/10/2007   Qualifier: Diagnosis of  By: Jenny Reichmann MD, Hunt Oris   . HYPERTENSION 01/10/2007  . HYPOTHYROIDISM 11/02/2006  . NEPHROLITHIASIS, HX OF 01/10/2007  . SINUSITIS- ACUTE-NOS 04/03/2010   Past Surgical History:  Procedure Laterality Date  . ABDOMINAL HYSTERECTOMY  1996  . s/p selective nerve root block 2002     per Dr. Louanne Skye to right neck  . TONSILLECTOMY  1961    reports that she has never smoked. She has never used smokeless  tobacco. She reports that she does not drink alcohol or use drugs. family history includes Diabetes in an other family member; Hypertension in an other family member. No Known Allergies Current Outpatient Medications on File Prior to Visit  Medication Sig Dispense Refill  . ALPRAZolam (XANAX) 0.5 MG tablet   2  . amLODipine (NORVASC) 5 MG tablet TAKE 1 TABLET(5 MG) BY MOUTH DAILY. 90 tablet 3  . aspirin EC 81 MG tablet Take 1 tablet (81 mg total) by mouth daily. 90 tablet 11  . estradiol (ESTRACE) 2 MG tablet Take 2 mg by mouth daily.    . fluticasone (FLONASE) 50 MCG/ACT nasal spray Place 1 spray into both nostrils daily. 16 g 11  . levothyroxine (SYNTHROID, LEVOTHROID) 125 MCG tablet Take 1 tablet (125 mcg total) by mouth daily. 90 tablet 3  . lisinopril (PRINIVIL,ZESTRIL) 40 MG tablet TAKE 1 TABLET(40 MG) BY MOUTH DAILY 90 tablet 0  . ondansetron (ZOFRAN) 4 MG tablet Take 1 tablet (4 mg total) by mouth every 8 (eight) hours as needed for nausea or vomiting. 20 tablet 1  . predniSONE (DELTASONE) 20 MG tablet Take 1 tablet (20 mg total) by mouth daily with breakfast. 10 tablet 0  . traMADol (ULTRAM) 50 MG tablet Take 1 tablet (50 mg total) by mouth every 8 (eight) hours as needed. 30 tablet 0  . Vitamin D, Ergocalciferol, (DRISDOL) 50000 units CAPS capsule Take 1 capsule (50,000 Units total) by mouth every 7 (seven) days. 12 capsule 0  .  zolpidem (AMBIEN) 10 MG tablet TAKE 1 TABLET(10 MG) BY MOUTH AT BEDTIME AS NEEDED FOR SLEEP 30 tablet 5   No current facility-administered medications on file prior to visit.     Observations/Objective: Alert, NAD, appropriate mood and affect, resps normal, cn 2-12 intact, moves all 4s, no visible rash or swelling Lab Results  Component Value Date   WBC 8.5 10/14/2017   HGB 12.4 10/14/2017   HCT 37.5 10/14/2017   PLT 264.0 10/14/2017   GLUCOSE 100 (H) 10/14/2017   CHOL 178 06/29/2017   TRIG 197.0 (H) 06/29/2017   HDL 59.60 06/29/2017   LDLDIRECT  112.0 07/06/2014   LDLCALC 79 06/29/2017   ALT 11 10/14/2017   AST 13 10/14/2017   NA 137 10/14/2017   K 3.8 10/14/2017   CL 101 10/14/2017   CREATININE 0.73 10/14/2017   BUN 13 10/14/2017   CO2 28 10/14/2017   TSH 0.24 (L) 10/14/2017   Assessment and Plan: See notes  Follow Up Instructions: See notes   I discussed the assessment and treatment plan with the patient. The patient was provided an opportunity to ask questions and all were answered. The patient agreed with the plan and demonstrated an understanding of the instructions.   The patient was advised to call back or seek an in-person evaluation if the symptoms worsen or if the condition fails to improve as anticipated.   Cathlean Cower, MD

## 2018-09-28 ENCOUNTER — Telehealth: Payer: Self-pay | Admitting: Internal Medicine

## 2018-09-28 MED ORDER — ZOLPIDEM TARTRATE 10 MG PO TABS: ORAL_TABLET | ORAL | 5 refills | Status: DC

## 2018-09-28 NOTE — Telephone Encounter (Signed)
Done erx 

## 2018-10-20 ENCOUNTER — Encounter: Payer: Self-pay | Admitting: Internal Medicine

## 2018-10-20 MED ORDER — LISINOPRIL 40 MG PO TABS: ORAL_TABLET | ORAL | 0 refills | Status: DC

## 2018-11-02 ENCOUNTER — Ambulatory Visit: Payer: PRIVATE HEALTH INSURANCE | Admitting: Internal Medicine

## 2018-11-10 ENCOUNTER — Other Ambulatory Visit (INDEPENDENT_AMBULATORY_CARE_PROVIDER_SITE_OTHER): Payer: PRIVATE HEALTH INSURANCE

## 2018-11-10 ENCOUNTER — Ambulatory Visit (INDEPENDENT_AMBULATORY_CARE_PROVIDER_SITE_OTHER): Payer: PRIVATE HEALTH INSURANCE | Admitting: Internal Medicine

## 2018-11-10 ENCOUNTER — Encounter (HOSPITAL_COMMUNITY): Payer: Self-pay | Admitting: Emergency Medicine

## 2018-11-10 ENCOUNTER — Encounter: Payer: Self-pay | Admitting: Internal Medicine

## 2018-11-10 ENCOUNTER — Other Ambulatory Visit: Payer: Self-pay | Admitting: Internal Medicine

## 2018-11-10 ENCOUNTER — Other Ambulatory Visit: Payer: Self-pay

## 2018-11-10 ENCOUNTER — Emergency Department (HOSPITAL_COMMUNITY)
Admission: EM | Admit: 2018-11-10 | Discharge: 2018-11-10 | Disposition: A | Discharge status: Home / Self Care | Payer: PRIVATE HEALTH INSURANCE | Attending: Emergency Medicine | Admitting: Emergency Medicine

## 2018-11-10 VITALS — BP 142/88 | HR 92 | Temp 98.2°F | Ht 66.5 in | Wt 201.0 lb

## 2018-11-10 DIAGNOSIS — E611 Iron deficiency: Secondary | ICD-10-CM | POA: Diagnosis not present

## 2018-11-10 DIAGNOSIS — Z7982 Long term (current) use of aspirin: Secondary | ICD-10-CM | POA: Diagnosis not present

## 2018-11-10 DIAGNOSIS — F329 Major depressive disorder, single episode, unspecified: Secondary | ICD-10-CM

## 2018-11-10 DIAGNOSIS — E559 Vitamin D deficiency, unspecified: Secondary | ICD-10-CM

## 2018-11-10 DIAGNOSIS — E871 Hypo-osmolality and hyponatremia: Secondary | ICD-10-CM

## 2018-11-10 DIAGNOSIS — Z0001 Encounter for general adult medical examination with abnormal findings: Secondary | ICD-10-CM | POA: Diagnosis not present

## 2018-11-10 DIAGNOSIS — Z79899 Other long term (current) drug therapy: Secondary | ICD-10-CM | POA: Diagnosis not present

## 2018-11-10 DIAGNOSIS — M353 Polymyalgia rheumatica: Secondary | ICD-10-CM | POA: Insufficient documentation

## 2018-11-10 DIAGNOSIS — E538 Deficiency of other specified B group vitamins: Secondary | ICD-10-CM

## 2018-11-10 DIAGNOSIS — F419 Anxiety disorder, unspecified: Secondary | ICD-10-CM | POA: Diagnosis not present

## 2018-11-10 DIAGNOSIS — R899 Unspecified abnormal finding in specimens from other organs, systems and tissues: Secondary | ICD-10-CM

## 2018-11-10 DIAGNOSIS — E039 Hypothyroidism, unspecified: Secondary | ICD-10-CM | POA: Insufficient documentation

## 2018-11-10 DIAGNOSIS — R739 Hyperglycemia, unspecified: Secondary | ICD-10-CM

## 2018-11-10 DIAGNOSIS — I10 Essential (primary) hypertension: Secondary | ICD-10-CM | POA: Diagnosis not present

## 2018-11-10 DIAGNOSIS — R7989 Other specified abnormal findings of blood chemistry: Secondary | ICD-10-CM | POA: Diagnosis present

## 2018-11-10 DIAGNOSIS — F32A Depression, unspecified: Secondary | ICD-10-CM | POA: Insufficient documentation

## 2018-11-10 HISTORY — DX: Polymyalgia rheumatica: M35.3

## 2018-11-10 LAB — BASIC METABOLIC PANEL
Anion gap: 11 (ref 5–15)
BUN: 12 mg/dL (ref 6–23)
BUN: 13 mg/dL (ref 8–23)
CO2: 27 mEq/L (ref 19–32)
CO2: 26 mmol/L (ref 22–32)
Calcium: 9.6 mg/dL (ref 8.4–10.5)
Calcium: 9.6 mg/dL (ref 8.9–10.3)
Chloride: 89 mEq/L — ABNORMAL LOW (ref 96–112)
Chloride: 103 mmol/L (ref 98–111)
Creatinine, Ser: 0.75 mg/dL (ref 0.40–1.20)
Creatinine, Ser: 0.71 mg/dL (ref 0.44–1.00)
GFR calc Af Amer: >60 mL/min (ref >60)
GFR calc non Af Amer: >60 mL/min (ref >60)
GFR: 77.97 mL/min (ref >60.00)
Glucose, Bld: 91 mg/dL (ref 70–99)
Glucose, Bld: 100 mg/dL — ABNORMAL HIGH (ref 70–99)
Potassium: 3.7 mEq/L (ref 3.5–5.1)
Potassium: 3.6 mmol/L (ref 3.5–5.1)
Sodium: 119 mEq/L — CL (ref 135–145)
Sodium: 140 mmol/L (ref 135–145)

## 2018-11-10 LAB — CBC
HCT: 39.6 % (ref 36.0–46.0)
Hemoglobin: 12.1 g/dL (ref 12.0–15.0)
MCH: 27.5 pg (ref 26.0–34.0)
MCHC: 30.6 g/dL (ref 30.0–36.0)
MCV: 90 fL (ref 80.0–100.0)
Platelets: 278 10*3/uL (ref 150–400)
RBC: 4.4 MIL/uL (ref 3.87–5.11)
RDW: 13.5 % (ref 11.5–15.5)
WBC: 9.6 10*3/uL (ref 4.0–10.5)
nRBC: 0 % (ref 0.0–0.2)

## 2018-11-10 LAB — LDL CHOLESTEROL, DIRECT: Direct LDL: 115 mg/dL

## 2018-11-10 LAB — URINALYSIS, ROUTINE W REFLEX MICROSCOPIC
Bilirubin Urine: NEGATIVE
Hgb urine dipstick: NEGATIVE
Ketones, ur: NEGATIVE
Leukocytes,Ua: NEGATIVE
Nitrite: NEGATIVE
RBC / HPF: NONE SEEN (ref 0–?)
Specific Gravity, Urine: 1.02 (ref 1.000–1.030)
Total Protein, Urine: NEGATIVE
Urine Glucose: NEGATIVE
Urobilinogen, UA: 0.2 (ref 0.0–1.0)
WBC, UA: NONE SEEN (ref 0–?)
pH: 6 (ref 5.0–8.0)

## 2018-11-10 LAB — LIPID PANEL
Cholesterol: 213 mg/dL — ABNORMAL HIGH (ref 0–200)
HDL: 68.4 mg/dL (ref >39.00)
NonHDL: 144.17
Total CHOL/HDL Ratio: 3
Triglycerides: 292 mg/dL — ABNORMAL HIGH (ref 0.0–149.0)
VLDL: 58.4 mg/dL — ABNORMAL HIGH (ref 0.0–40.0)

## 2018-11-10 LAB — HEPATIC FUNCTION PANEL
ALT: 9 U/L (ref 0–35)
AST: 12 U/L (ref 0–37)
Albumin: 4.3 g/dL (ref 3.5–5.2)
Alkaline Phosphatase: 114 U/L (ref 39–117)
Bilirubin, Direct: 0 mg/dL (ref 0.0–0.3)
Total Bilirubin: 0.3 mg/dL (ref 0.2–1.2)
Total Protein: 7.2 g/dL (ref 6.0–8.3)

## 2018-11-10 LAB — CBC WITH DIFFERENTIAL/PLATELET
Basophils Absolute: 0.1 10*3/uL (ref 0.0–0.1)
Basophils Relative: 1 % (ref 0.0–3.0)
Eosinophils Absolute: 0.2 10*3/uL (ref 0.0–0.7)
Eosinophils Relative: 2.4 % (ref 0.0–5.0)
HCT: 38.5 % (ref 36.0–46.0)
Hemoglobin: 12.7 g/dL (ref 12.0–15.0)
Lymphocytes Relative: 34.2 % (ref 12.0–46.0)
Lymphs Abs: 3.1 10*3/uL (ref 0.7–4.0)
MCHC: 32.9 g/dL (ref 30.0–36.0)
MCV: 85 fl (ref 78.0–100.0)
Monocytes Absolute: 0.6 10*3/uL (ref 0.1–1.0)
Monocytes Relative: 7 % (ref 3.0–12.0)
Neutro Abs: 5 10*3/uL (ref 1.4–7.7)
Neutrophils Relative %: 55.4 % (ref 43.0–77.0)
Platelets: 276 10*3/uL (ref 150.0–400.0)
RBC: 4.53 Mil/uL (ref 3.87–5.11)
RDW: 14.1 % (ref 11.5–15.5)
WBC: 9.1 10*3/uL (ref 4.0–10.5)

## 2018-11-10 LAB — IBC PANEL
Iron: 54 ug/dL (ref 42–145)
Saturation Ratios: 9.2 % — ABNORMAL LOW (ref 20.0–50.0)
Transferrin: 418 mg/dL — ABNORMAL HIGH (ref 212.0–360.0)

## 2018-11-10 LAB — VITAMIN B12: Vitamin B-12: 170 pg/mL — ABNORMAL LOW (ref 211–911)

## 2018-11-10 LAB — TSH: TSH: 2.1 u[IU]/mL (ref 0.35–4.50)

## 2018-11-10 LAB — HEMOGLOBIN A1C: Hgb A1c MFr Bld: 5.2 % (ref 4.6–6.5)

## 2018-11-10 LAB — VITAMIN D 25 HYDROXY (VIT D DEFICIENCY, FRACTURES): VITD: 56.14 ng/mL (ref 30.00–100.00)

## 2018-11-10 MED ORDER — LISINOPRIL 40 MG PO TABS: ORAL_TABLET | ORAL | 3 refills | Status: DC

## 2018-11-10 MED ORDER — VITAMIN D (ERGOCALCIFEROL) 1.25 MG (50000 UNIT) PO CAPS: 50000.0000 [IU] | ORAL_CAPSULE | ORAL | 0 refills | Status: DC

## 2018-11-10 MED ORDER — LEVOTHYROXINE SODIUM 125 MCG PO TABS: 125.0000 ug | ORAL_TABLET | Freq: Every day | ORAL | 3 refills | Status: DC

## 2018-11-10 MED ORDER — PREDNISONE 10 MG PO TABS: ORAL_TABLET | ORAL | 0 refills | Status: DC

## 2018-11-10 MED ORDER — CITALOPRAM HYDROBROMIDE 10 MG PO TABS: 10.0000 mg | ORAL_TABLET | Freq: Every day | ORAL | 3 refills | Status: DC

## 2018-11-10 MED ORDER — AMLODIPINE BESYLATE 5 MG PO TABS: ORAL_TABLET | ORAL | 3 refills | Status: DC

## 2018-11-10 NOTE — Patient Instructions (Signed)
Please take all new medication as prescribed - the celexa 10 mg per day, and prednisone  Please call to make your next appt with Rheumatology  Please continue all other medications as before, and refills have been done if requested.  Please have the pharmacy call with any other refills you may need.  Please continue your efforts at being more active, low cholesterol diet, and weight control.  You are otherwise up to date with prevention measures today.  Please keep your appointments with your specialists as you may have planned  Please go to the LAB in the Basement (turn left off the elevator) for the tests to be done today  You will be contacted by phone if any changes need to be made immediately.  Otherwise, you will receive a letter about your results with an explanation, but please check with MyChart first.  Please remember to sign up for MyChart if you have not done so, as this will be important to you in the future with finding out test results, communicating by private email, and scheduling acute appointments online when needed.  Please return in 6 months, or sooner if needed

## 2018-11-10 NOTE — ED Triage Notes (Addendum)
Patient reports sent from PCP for abnormal sodium level 119. Denies weakness, N/V/D.

## 2018-11-10 NOTE — ED Provider Notes (Signed)
[place] DEPT Provider Note   CSN: [id number] Arrival date & time: 11/10/18  1342     History   Chief Complaint Chief Complaint  Patient presents with  . Abnormal Lab    HPI [name] is a 63 y.o. female.  Presents with abnormal lab value.  Patient had routine blood work performed and it showed a sodium level of 119.  Patient states that she has been having no symptoms, feels that she is in her normal state of health.  Specifically, denies any changes in diet, no new medications, no recent illnesses, no urinary symptoms or change in urinary frequency.     HPI  Past Medical History:  Diagnosis Date  . ALLERGIC RHINITIS 11/02/2006  . Cervical disc disease 04/15/2011  . Chronic maxillary sinusitis 10/05/2008  . Chronic neck pain 04/15/2011  . GERD 11/02/2006  . HYPERLIPIDEMIA 01/10/2007  . Hyperlipidemia 01/10/2007   Qualifier: Diagnosis of  By: Jenny Reichmann MD, Hunt Oris   . HYPERTENSION 01/10/2007  . HYPOTHYROIDISM 11/02/2006  . NEPHROLITHIASIS, HX OF 01/10/2007  . PMR (polymyalgia rheumatica) ([place]) 11/10/2018  . SINUSITIS- ACUTE-NOS 04/03/2010    Patient Active Problem List   Diagnosis Date Noted  . PMR (polymyalgia rheumatica) ([facility]) 11/10/2018  . Anxiety and depression 11/10/2018  . Acute pyelonephritis 04/29/2018  . Proximal limb weakness 10/14/2017  . Bilateral shoulder pain 10/14/2017  . Insomnia 10/14/2017  . Weight loss 10/14/2017  . Enlarged thyroid gland 10/14/2017  . Right hip pain 06/10/2016  . Lower back pain 03/24/2014  . Renal stone 06/06/2013  . Acute sinus infection 02/14/2013  . Cervical disc disease 04/15/2011  . Chronic neck pain 04/15/2011  . Encounter for well adult exam with abnormal findings 07/29/2010  . HYPERSOMNIA 02/06/2009  . Chronic maxillary sinusitis 10/05/2008  . THRUSH 05/18/2008  . PLANTAR FASCIITIS 05/18/2008  . GANGLION CYST, WRIST, LEFT 03/06/2008  . Hyperlipidemia 01/10/2007  . Essential hypertension  01/10/2007  . CERVICAL RADICULOPATHY 01/10/2007  . NEPHROLITHIASIS, HX OF 01/10/2007  . Hypothyroidism 11/02/2006  . ALLERGIC RHINITIS 11/02/2006  . GERD 11/02/2006    Past Surgical History:  Procedure Laterality Date  . ABDOMINAL HYSTERECTOMY  1996  . s/p selective nerve root block 2002     per Dr. Louanne Skye to right neck  . TONSILLECTOMY  1961     OB History   No obstetric history on file.      Home Medications    Prior to Admission medications   Medication Sig Start Date End Date Taking? Authorizing Provider  ALPRAZolam Duanne Moron) 0.5 MG tablet  05/12/14   [provider]  amLODipine (NORVASC) 5 MG tablet TAKE 1 TABLET(5 MG) BY MOUTH DAILY. 11/10/18   Biagio Borg, MD  aspirin EC 81 MG tablet Take 1 tablet (81 mg total) by mouth daily. 06/10/15   Biagio Borg, MD  citalopram (CELEXA) 10 MG tablet Take 1 tablet (10 mg total) by mouth daily. 11/10/18 11/10/19  Biagio Borg, MD  cyclobenzaprine (FLEXERIL) 5 MG tablet Take 1 tablet (5 mg total) by mouth 3 (three) times daily as needed for muscle spasms. 09/15/18   Biagio Borg, MD  estradiol (ESTRACE) 2 MG tablet Take 2 mg by mouth daily.    [provider]  gabapentin (NEURONTIN) 100 MG capsule Take 1 capsule (100 mg total) by mouth 3 (three) times daily. 09/15/18   Biagio Borg, MD  HYDROcodone-acetaminophen (NORCO/VICODIN) 5-325 MG tablet Take 1 tablet by mouth every 6 (six)  hours as needed. 09/15/18   Biagio Borg, MD  levothyroxine (SYNTHROID) 125 MCG tablet Take 1 tablet (125 mcg total) by mouth daily. 11/10/18   Biagio Borg, MD  lisinopril (ZESTRIL) 40 MG tablet TAKE 1 TABLET(40 MG) BY MOUTH DAILY 11/10/18   Biagio Borg, MD  predniSONE (DELTASONE) 10 MG tablet 3 tabs by mouth per day for 3 days,2tabs per day for 3 days,1tab per day for 3 days 11/10/18   Biagio Borg, MD  Vitamin D, Ergocalciferol, (DRISDOL) 50000 units CAPS capsule Take 1 capsule (50,000 Units total) by mouth every 7 (seven) days. 11/08/17   Lyndal Pulley, DO  zolpidem (AMBIEN) 10 MG tablet TAKE 1 TABLET(10 MG) BY MOUTH AT BEDTIME AS NEEDED FOR SLEEP 09/28/18   Biagio Borg, MD    Family History Family History  Problem Relation Age of Onset  . Hypertension Other   . Diabetes Other     Social History Social History   Tobacco Use  . Smoking status: Never Smoker  . Smokeless tobacco: Never Used  Substance Use Topics  . Alcohol use: No  . Drug use: No     Allergies   Patient has no known allergies.   Review of Systems Review of Systems  Constitutional: Negative for chills and fever.  HENT: Negative for ear pain and sore throat.   Eyes: Negative for pain and visual disturbance.  Respiratory: Negative for cough and shortness of breath.   Cardiovascular: Negative for chest pain and palpitations.  Gastrointestinal: Negative for abdominal pain and vomiting.  Genitourinary: Negative for dysuria and hematuria.  Musculoskeletal: Negative for arthralgias and back pain.  Skin: Negative for color change and rash.  Neurological: Negative for seizures and syncope.  All other systems reviewed and are negative.    Physical Exam Updated Vital Signs BP (!) 167/91 (BP Location: Right Arm)   Pulse 95   Temp 99 F (37.2 C) (Oral)   Resp 18   SpO2 100%   Physical Exam Vitals signs and nursing note reviewed.  Constitutional:      General: She is not in acute distress.    Appearance: She is well-developed.  HENT:     Head: Normocephalic and atraumatic.  Eyes:     Conjunctiva/sclera: Conjunctivae normal.  Neck:     Musculoskeletal: Neck supple.  Cardiovascular:     Rate and Rhythm: Normal rate and regular rhythm.     Heart sounds: No murmur.  Pulmonary:     Effort: Pulmonary effort is normal. No respiratory distress.     Breath sounds: Normal breath sounds.  Abdominal:     Palpations: Abdomen is soft.     Tenderness: There is no abdominal tenderness.  Skin:    General: Skin is warm and dry.  Neurological:     Mental  Status: She is alert.      ED Treatments / Results  Labs (all labs ordered are listed, but only abnormal results are displayed) Labs Reviewed  BASIC METABOLIC PANEL  CBC    EKG None  Radiology No results found.  Procedures Procedures (including critical care time)  Medications Ordered in ED Medications - No data to display   Initial Impression / Assessment and Plan / ED Course  I have reviewed the triage vital signs and the nursing notes.  Pertinent labs & imaging results that were available during my care of the patient were reviewed by me and considered in my medical decision making (see chart for details).  63 year old lady who had a sodium of 119 on routine blood work.  Here patient was well-appearing had no symptoms.  Repeated blood work and sodium was 140.  Suspect lab error.  Recommended recheck with primary doctor and repeat labs.  Final Clinical Impressions(s) / ED Diagnoses   Final diagnoses:  Abnormal laboratory test result    ED Discharge Orders    None       Lucrezia Starch, MD 11/10/18 2333

## 2018-11-10 NOTE — Discharge Instructions (Signed)
Call your primary doctor to schedule a follow up appointment and repeat lab work as an outpatient.  If you develop weakness, vomiting, episodes of passing out, chest pain, difficulty breathing or other concerning symptom, please return here for reassessment.

## 2018-11-10 NOTE — Progress Notes (Signed)
Subjective:    Patient ID: [name], female    DOB: 31-May-1955, 63 y.o.   MRN: [id number]  HPI   Here for wellness and f/u;  Overall doing ok;  Pt denies Chest pain, worsening SOB, DOE, wheezing, orthopnea, PND, worsening LE edema, palpitations, dizziness or syncope.  Pt denies neurological change such as new headache, facial or extremity weakness.  Pt denies polydipsia, polyuria, or low sugar symptoms. Pt states overall good compliance with treatment and medications, good tolerability, and has been trying to follow appropriate diet.  Pt denies worsening depressive symptoms, suicidal ideation or panic. No fever, night sweats, wt loss, loss of appetite, or other constitutional symptoms.  Pt states good ability with ADL's, has low fall risk, home safety reviewed and adequate, no other significant changes in hearing or vision, and only occasionally active with exercise. Pt not sure about colonoscopy so wants to check with insurance first Also, C/o worsening depression last few wks, daughter 8yo moved back in with her, had 2 involuntary psych admits, and also recent ankle fracture.  Denies suicidal ideation, or panic; has ongoing anxiety, not increased recently.  Has son also out of work and had just bought a home prior.  Second duaghter husband out of work as well.  Declines counseling.   Also mentions has PMR per rheumatology, now on prednisone since saw Dr Tamala Julian prior as well, has responded very well.  Started 20 mg last sept 2020, and tapered down 1 mg per month to now off, but pain and fatigue are starting to return, plans to f/u with rheum again.    Past Medical History:  Diagnosis Date  . ALLERGIC RHINITIS 11/02/2006  . Cervical disc disease 04/15/2011  . Chronic maxillary sinusitis 10/05/2008  . Chronic neck pain 04/15/2011  . GERD 11/02/2006  . HYPERLIPIDEMIA 01/10/2007  . Hyperlipidemia 01/10/2007   Qualifier: Diagnosis of  By: Jenny Reichmann MD, Hunt Oris   . HYPERTENSION 01/10/2007  . HYPOTHYROIDISM  11/02/2006  . NEPHROLITHIASIS, HX OF 01/10/2007  . PMR (polymyalgia rheumatica) ([place]) 11/10/2018  . SINUSITIS- ACUTE-NOS 04/03/2010   Past Surgical History:  Procedure Laterality Date  . ABDOMINAL HYSTERECTOMY  1996  . s/p selective nerve root block 2002     per Dr. Louanne Skye to right neck  . TONSILLECTOMY  1961    reports that she has never smoked. She has never used smokeless tobacco. She reports that she does not drink alcohol or use drugs. family history includes Diabetes in an other family member; Hypertension in an other family member. No Known Allergies Current Outpatient Medications on File Prior to Visit  Medication Sig Dispense Refill  . ALPRAZolam (XANAX) 0.5 MG tablet   2  . aspirin EC 81 MG tablet Take 1 tablet (81 mg total) by mouth daily. 90 tablet 11  . cyclobenzaprine (FLEXERIL) 5 MG tablet Take 1 tablet (5 mg total) by mouth 3 (three) times daily as needed for muscle spasms. 60 tablet 1  . estradiol (ESTRACE) 2 MG tablet Take 2 mg by mouth daily.    Marland Kitchen gabapentin (NEURONTIN) 100 MG capsule Take 1 capsule (100 mg total) by mouth 3 (three) times daily. 90 capsule 3  . HYDROcodone-acetaminophen (NORCO/VICODIN) 5-325 MG tablet Take 1 tablet by mouth every 6 (six) hours as needed. 30 tablet 0  . zolpidem (AMBIEN) 10 MG tablet TAKE 1 TABLET(10 MG) BY MOUTH AT BEDTIME AS NEEDED FOR SLEEP 30 tablet 5   No current facility-administered medications on file prior to visit.  Review of Systems Constitutional: Negative for other unusual diaphoresis, sweats, appetite or weight changes HENT: Negative for other worsening hearing loss, ear pain, facial swelling, mouth sores or neck stiffness.   Eyes: Negative for other worsening pain, redness or other visual disturbance.  Respiratory: Negative for other stridor or swelling Cardiovascular: Negative for other palpitations or other chest pain  Gastrointestinal: Negative for worsening diarrhea or loose stools, blood in stool, distention or  other pain Genitourinary: Negative for hematuria, flank pain or other change in urine volume.  Musculoskeletal: Negative for myalgias or other joint swelling.  Skin: Negative for other color change, or other wound or worsening drainage.  Neurological: Negative for other syncope or numbness. Hematological: Negative for other adenopathy or swelling Psychiatric/Behavioral: Negative for hallucinations, other worsening agitation, SI, self-injury, or new decreased concentration All other system neg per pt    Objective:   Physical Exam BP (!) 142/88   Pulse 92   Temp 98.2 F (36.8 C) (Oral)   Ht 5' 6.5" (1.689 m)   Wt 201 lb (91.2 kg)   SpO2 97%   BMI 31.96 kg/m  VS noted,  Constitutional: Pt is oriented to person, place, and time. Appears well-developed and well-nourished, in no significant distress and comfortable Head: Normocephalic and atraumatic  Eyes: Conjunctivae and EOM are normal. Pupils are equal, round, and reactive to light Right Ear: External ear normal without discharge Left Ear: External ear normal without discharge Nose: Nose without discharge or deformity Mouth/Throat: Oropharynx is without other ulcerations and moist  Neck: Normal range of motion. Neck supple. No JVD present. No tracheal deviation present or significant neck LA or mass Cardiovascular: Normal rate, regular rhythm, normal heart sounds and intact distal pulses.   Pulmonary/Chest: WOB normal and breath sounds without rales or wheezing  Abdominal: Soft. Bowel sounds are normal. NT. No HSM  Musculoskeletal: Normal range of motion. Exhibits no edema Lymphadenopathy: Has no other cervical adenopathy.  Neurological: Pt is alert and oriented to person, place, and time. Pt has normal reflexes. No cranial nerve deficit. Motor grossly intact, Gait intact Skin: Skin is warm and dry. No rash noted or new ulcerations Psychiatric:  Has anxiety depressed mood and affect. Behavior is normal without agitation No other  exam findings Lab Results  Component Value Date   WBC 9.6 11/10/2018   HGB 12.1 11/10/2018   HCT 39.6 11/10/2018   PLT 278 11/10/2018   GLUCOSE 100 (H) 11/10/2018   CHOL 213 (H) 11/10/2018   TRIG 292.0 (H) 11/10/2018   HDL 68.40 11/10/2018   LDLDIRECT 115.0 11/10/2018   LDLCALC 79 06/29/2017   ALT 9 11/10/2018   AST 12 11/10/2018   NA 140 11/10/2018   K 3.6 11/10/2018   CL 103 11/10/2018   CREATININE 0.71 11/10/2018   BUN 13 11/10/2018   CO2 26 11/10/2018   TSH 2.10 11/10/2018   HGBA1C 5.2 11/10/2018      Assessment & Plan:

## 2018-11-11 ENCOUNTER — Telehealth: Payer: Self-pay

## 2018-11-11 NOTE — Telephone Encounter (Signed)
Pt has viewed results via MyChart  

## 2018-11-11 NOTE — Telephone Encounter (Signed)
-----   Message from Biagio Borg, MD sent at 11/10/2018  7:40 PM EDT ----- Left message on MyChart, pt to cont same tx except  The test results show that your current treatment is OK, except the sodium level is dangerously low.  You have been called about this and recommended to go to the ED.  Also, your Vitamin D level is low.  Please take Vitamin D 50000 units weekly for 12 weeks, then plan to change to OTC Vitamin D3 at 2000 units per day, indefinitely.Redmond Baseman to please inform pt, I will do rx

## 2018-11-13 ENCOUNTER — Encounter: Payer: Self-pay | Admitting: Internal Medicine

## 2018-11-13 DIAGNOSIS — R739 Hyperglycemia, unspecified: Secondary | ICD-10-CM | POA: Insufficient documentation

## 2018-11-13 NOTE — Assessment & Plan Note (Addendum)
With increased symptoms, for bridging prednisone until can f/u with rheumatology  In addition to the time spent performing CPE, I spent an additional 25 minutes face to face,in which greater than 50% of this time was spent in counseling and coordination of care for patient's acute illness as documented, including the differential dx, treatment, further evaluation and other management of PMR, anxiety, depression, and hyperglycemia

## 2018-11-13 NOTE — Assessment & Plan Note (Signed)

## 2018-11-13 NOTE — Assessment & Plan Note (Signed)
Recent onset, for celexa 10 qd, declines referral for counseling or psychiatry

## 2018-11-13 NOTE — Assessment & Plan Note (Signed)
stable overall by history and exam, recent data reviewed with pt, and pt to continue medical treatment as before,  to f/u any worsening symptoms or concerns, for a1c with labs 

## 2018-11-29 ENCOUNTER — Encounter: Payer: Self-pay | Admitting: Internal Medicine

## 2018-11-29 DIAGNOSIS — E049 Nontoxic goiter, unspecified: Secondary | ICD-10-CM

## 2018-11-30 ENCOUNTER — Other Ambulatory Visit: Payer: Self-pay | Admitting: Internal Medicine

## 2018-11-30 DIAGNOSIS — E049 Nontoxic goiter, unspecified: Secondary | ICD-10-CM

## 2018-12-08 ENCOUNTER — Ambulatory Visit
Admission: RE | Admit: 2018-12-08 | Discharge: 2018-12-08 | Disposition: A | Discharge status: Home / Self Care | Payer: PRIVATE HEALTH INSURANCE | Source: Ambulatory Visit | Attending: Internal Medicine | Admitting: Internal Medicine

## 2018-12-08 DIAGNOSIS — E049 Nontoxic goiter, unspecified: Secondary | ICD-10-CM

## 2019-02-09 ENCOUNTER — Ambulatory Visit: Payer: PRIVATE HEALTH INSURANCE | Admitting: Endocrinology

## 2019-02-10 ENCOUNTER — Other Ambulatory Visit: Payer: Self-pay

## 2019-02-10 ENCOUNTER — Encounter: Payer: Self-pay | Admitting: Internal Medicine

## 2019-02-10 ENCOUNTER — Ambulatory Visit (INDEPENDENT_AMBULATORY_CARE_PROVIDER_SITE_OTHER): Payer: PRIVATE HEALTH INSURANCE | Admitting: Internal Medicine

## 2019-02-10 DIAGNOSIS — Z20822 Contact with and (suspected) exposure to covid-19: Secondary | ICD-10-CM

## 2019-02-10 DIAGNOSIS — F419 Anxiety disorder, unspecified: Secondary | ICD-10-CM

## 2019-02-10 DIAGNOSIS — F32A Depression, unspecified: Secondary | ICD-10-CM

## 2019-02-10 DIAGNOSIS — J0191 Acute recurrent sinusitis, unspecified: Secondary | ICD-10-CM

## 2019-02-10 DIAGNOSIS — R739 Hyperglycemia, unspecified: Secondary | ICD-10-CM

## 2019-02-10 DIAGNOSIS — F329 Major depressive disorder, single episode, unspecified: Secondary | ICD-10-CM

## 2019-02-10 DIAGNOSIS — Z20828 Contact with and (suspected) exposure to other viral communicable diseases: Secondary | ICD-10-CM

## 2019-02-10 MED ORDER — LEVOFLOXACIN 500 MG PO TABS: 500.0000 mg | ORAL_TABLET | Freq: Every day | ORAL | 0 refills | Status: AC

## 2019-02-10 NOTE — Progress Notes (Deleted)
Subjective:    Patient ID: [name], female    DOB: 1955-08-21, 63 y.o.   MRN: [id number]  HPI   Here with 2-3 days acute onset fever, facial pain, pressure, headache, general weakness and malaise, and greenish d/c, with mild ST and cough, but pt denies chest pain, wheezing, increased sob or doe, orthopnea, PND, increased LE swelling, palpitations, dizziness or syncope.  No sick contacts but husband was recently found exposed to [place] person with his testing currently pending.  Pt denies new neurological symptoms such as new headache, or facial or extremity weakness or numbness   Pt denies polydipsia, polyuria Past Medical History:  Diagnosis Date  . ALLERGIC RHINITIS 11/02/2006  . Cervical disc disease 04/15/2011  . Chronic maxillary sinusitis 10/05/2008  . Chronic neck pain 04/15/2011  . GERD 11/02/2006  . HYPERLIPIDEMIA 01/10/2007  . Hyperlipidemia 01/10/2007   Qualifier: Diagnosis of  By: Jenny Reichmann MD, Hunt Oris   . HYPERTENSION 01/10/2007  . HYPOTHYROIDISM 11/02/2006  . NEPHROLITHIASIS, HX OF 01/10/2007  . PMR (polymyalgia rheumatica) ([place]) 11/10/2018  . SINUSITIS- ACUTE-NOS 04/03/2010   Past Surgical History:  Procedure Laterality Date  . ABDOMINAL HYSTERECTOMY  1996  . s/p selective nerve root block 2002     per Dr. Louanne Skye to right neck  . TONSILLECTOMY  1961    reports that she has never smoked. She has never used smokeless tobacco. She reports that she does not drink alcohol or use drugs. family history includes Diabetes in an other family member; Hypertension in an other family member. No Known Allergies Current Outpatient Medications on File Prior to Visit  Medication Sig Dispense Refill  . ALPRAZolam (XANAX) 0.5 MG tablet   2  . amLODipine (NORVASC) 5 MG tablet TAKE 1 TABLET(5 MG) BY MOUTH DAILY. 90 tablet 3  . aspirin EC 81 MG tablet Take 1 tablet (81 mg total) by mouth daily. 90 tablet 11  . citalopram (CELEXA) 10 MG tablet Take 1 tablet (10 mg total) by mouth daily. 90 tablet 3  .  cyclobenzaprine (FLEXERIL) 5 MG tablet Take 1 tablet (5 mg total) by mouth 3 (three) times daily as needed for muscle spasms. 60 tablet 1  . estradiol (ESTRACE) 2 MG tablet Take 2 mg by mouth daily.    Marland Kitchen gabapentin (NEURONTIN) 100 MG capsule Take 1 capsule (100 mg total) by mouth 3 (three) times daily. 90 capsule 3  . HYDROcodone-acetaminophen (NORCO/VICODIN) 5-325 MG tablet Take 1 tablet by mouth every 6 (six) hours as needed. 30 tablet 0  . levothyroxine (SYNTHROID) 125 MCG tablet Take 1 tablet (125 mcg total) by mouth daily. 90 tablet 3  . lisinopril (ZESTRIL) 40 MG tablet TAKE 1 TABLET(40 MG) BY MOUTH DAILY 90 tablet 3  . predniSONE (DELTASONE) 10 MG tablet 3 tabs by mouth per day for 3 days,2tabs per day for 3 days,1tab per day for 3 days 18 tablet 0  . Vitamin D, Ergocalciferol, (DRISDOL) 1.25 MG (50000 UT) CAPS capsule Take 1 capsule (50,000 Units total) by mouth every 7 (seven) days. 12 capsule 0  . zolpidem (AMBIEN) 10 MG tablet TAKE 1 TABLET(10 MG) BY MOUTH AT BEDTIME AS NEEDED FOR SLEEP 30 tablet 5   No current facility-administered medications on file prior to visit.    Review of Systems  Constitutional: Negative for other unusual diaphoresis or sweats HENT: Negative for ear discharge or swelling Eyes: Negative for other worsening visual disturbances Respiratory: Negative for stridor or other swelling  Gastrointestinal: Negative for worsening  distension or other blood Genitourinary: Negative for retention or other urinary change Musculoskeletal: Negative for other MSK pain or swelling Skin: Negative for color change or other new lesions Neurological: Negative for worsening tremors and other numbness  Psychiatric/Behavioral: Negative for worsening agitation or other fatigue All otherwise neg per pt     Objective:   Physical Exam There were no vitals taken for this visit. VS noted,  Constitutional: Pt is oriented to person, place, and time. Appears well-developed and  well-nourished, in no significant distress and comfortable Head: Normocephalic and atraumatic  Eyes: Conjunctivae and EOM are normal. Pupils are equal, round, and reactive to light Right Ear: External ear normal without discharge Left Ear: External ear normal without discharge Nose: Nose without discharge or deformity Mouth/Throat: Oropharynx is without other ulcerations and moist  Neck: Normal range of motion. Neck supple. No JVD present. No tracheal deviation present or significant neck LA or mass Cardiovascular: Normal rate, regular rhythm, normal heart sounds and intact distal pulses.   Pulmonary/Chest: WOB normal and breath sounds without rales or wheezing  Abdominal: Soft. Bowel sounds are normal. NT. No HSM  Musculoskeletal: Normal range of motion. Exhibits no edema Lymphadenopathy: Has no other cervical adenopathy.  Neurological: Pt is alert and oriented to person, place, and time. Pt has normal reflexes. No cranial nerve deficit. Motor grossly intact, Gait intact Skin: Skin is warm and dry. No rash noted or new ulcerations Psychiatric:  Has normal mood and affect. Behavior is normal without agitation All otherwise neg per pt Lab Results  Component Value Date   WBC 9.6 11/10/2018   HGB 12.1 11/10/2018   HCT 39.6 11/10/2018   PLT 278 11/10/2018   GLUCOSE 100 (H) 11/10/2018   CHOL 213 (H) 11/10/2018   TRIG 292.0 (H) 11/10/2018   HDL 68.40 11/10/2018   LDLDIRECT 115.0 11/10/2018   LDLCALC 79 06/29/2017   ALT 9 11/10/2018   AST 12 11/10/2018   NA 140 11/10/2018   K 3.6 11/10/2018   CL 103 11/10/2018   CREATININE 0.71 11/10/2018   BUN 13 11/10/2018   CO2 26 11/10/2018   TSH 2.10 11/10/2018   HGBA1C 5.2 11/10/2018       Assessment & Plan:

## 2019-02-10 NOTE — Progress Notes (Signed)
Patient ID: [name], female   DOB: 1955/11/22, 63 y.o.   MRN: [id number]  Virtual Visit via Video Note  I connected with [name] on 02/10/19 at  3:20 PM EST by a video enabled telemedicine application and verified that I am speaking with the correct person using two identifiers.  Location: Patient: at home Provider: at office   I discussed the limitations of evaluation and management by telemedicine and the availability of in person appointments. The patient expressed understanding and agreed to proceed.  History of Present Illness: Here with 2-3 days acute onset fever, facial pain, pressure, headache, general weakness and malaise, and greenish d/c, with mild ST and cough, but pt denies chest pain, wheezing, increased sob or doe, orthopnea, PND, increased LE swelling, palpitations, dizziness or syncope.  No sick contacts but husband was recently found exposed to [place] person with his testing currently pending.  Pt denies new neurological symptoms such as new headache, or facial or extremity weakness or numbness   Pt denies polydipsia, polyuria.  Denies worsening depressive symptoms, suicidal ideation, or panic Past Medical History:  Diagnosis Date  . ALLERGIC RHINITIS 11/02/2006  . Cervical disc disease 04/15/2011  . Chronic maxillary sinusitis 10/05/2008  . Chronic neck pain 04/15/2011  . GERD 11/02/2006  . HYPERLIPIDEMIA 01/10/2007  . Hyperlipidemia 01/10/2007   Qualifier: Diagnosis of  By: Jenny Reichmann MD, Hunt Oris   . HYPERTENSION 01/10/2007  . HYPOTHYROIDISM 11/02/2006  . NEPHROLITHIASIS, HX OF 01/10/2007  . PMR (polymyalgia rheumatica) ([place]) 11/10/2018  . SINUSITIS- ACUTE-NOS 04/03/2010   Past Surgical History:  Procedure Laterality Date  . ABDOMINAL HYSTERECTOMY  1996  . s/p selective nerve root block 2002     per Dr. Louanne Skye to right neck  . TONSILLECTOMY  1961    reports that she has never smoked. She has never used smokeless tobacco. She reports that she does not drink alcohol or use  drugs. family history includes Diabetes in an other family member; Hypertension in an other family member. No Known Allergies Current Outpatient Medications on File Prior to Visit  Medication Sig Dispense Refill  . ALPRAZolam (XANAX) 0.5 MG tablet   2  . amLODipine (NORVASC) 5 MG tablet TAKE 1 TABLET(5 MG) BY MOUTH DAILY. 90 tablet 3  . aspirin EC 81 MG tablet Take 1 tablet (81 mg total) by mouth daily. 90 tablet 11  . citalopram (CELEXA) 10 MG tablet Take 1 tablet (10 mg total) by mouth daily. 90 tablet 3  . cyclobenzaprine (FLEXERIL) 5 MG tablet Take 1 tablet (5 mg total) by mouth 3 (three) times daily as needed for muscle spasms. 60 tablet 1  . estradiol (ESTRACE) 2 MG tablet Take 2 mg by mouth daily.    Marland Kitchen gabapentin (NEURONTIN) 100 MG capsule Take 1 capsule (100 mg total) by mouth 3 (three) times daily. 90 capsule 3  . HYDROcodone-acetaminophen (NORCO/VICODIN) 5-325 MG tablet Take 1 tablet by mouth every 6 (six) hours as needed. 30 tablet 0  . levothyroxine (SYNTHROID) 125 MCG tablet Take 1 tablet (125 mcg total) by mouth daily. 90 tablet 3  . lisinopril (ZESTRIL) 40 MG tablet TAKE 1 TABLET(40 MG) BY MOUTH DAILY 90 tablet 3  . predniSONE (DELTASONE) 10 MG tablet 3 tabs by mouth per day for 3 days,2tabs per day for 3 days,1tab per day for 3 days 18 tablet 0  . Vitamin D, Ergocalciferol, (DRISDOL) 1.25 MG (50000 UT) CAPS capsule Take 1 capsule (50,000 Units total) by mouth every 7 (seven)  days. 12 capsule 0  . zolpidem (AMBIEN) 10 MG tablet TAKE 1 TABLET(10 MG) BY MOUTH AT BEDTIME AS NEEDED FOR SLEEP 30 tablet 5   No current facility-administered medications on file prior to visit.     Observations/Objective: Alert, NAD, appropriate mood and affect, resps normal, cn 2-12 intact, moves all 4s, no visible rash or swelling Lab Results  Component Value Date   WBC 9.6 11/10/2018   HGB 12.1 11/10/2018   HCT 39.6 11/10/2018   PLT 278 11/10/2018   GLUCOSE 100 (H) 11/10/2018   CHOL 213 (H)  11/10/2018   TRIG 292.0 (H) 11/10/2018   HDL 68.40 11/10/2018   LDLDIRECT 115.0 11/10/2018   LDLCALC 79 06/29/2017   ALT 9 11/10/2018   AST 12 11/10/2018   NA 140 11/10/2018   K 3.6 11/10/2018   CL 103 11/10/2018   CREATININE 0.71 11/10/2018   BUN 13 11/10/2018   CO2 26 11/10/2018   TSH 2.10 11/10/2018   HGBA1C 5.2 11/10/2018   Assessment and Plan: See notes  Follow Up Instructions: See notes   I discussed the assessment and treatment plan with the patient. The patient was provided an opportunity to ask questions and all were answered. The patient agreed with the plan and demonstrated an understanding of the instructions.   The patient was advised to call back or seek an in-person evaluation if the symptoms worsen or if the condition fails to improve as anticipated.   Cathlean Cower, MD

## 2019-02-10 NOTE — Assessment & Plan Note (Signed)
stable overall by history and exam, recent data reviewed with pt, and pt to continue medical treatment as before,  to f/u any worsening symptoms or concerns  

## 2019-02-10 NOTE — Patient Instructions (Signed)
Please take all new medication as prescribed - the antibiotic  Please continue all other medications as before, and refills have been done if requested.  Please have the pharmacy call with any other refills you may need.  Please continue your efforts at being more active, low cholesterol diet, and weight control.  Please keep your appointments with your specialists as you may have planned    

## 2019-02-10 NOTE — Assessment & Plan Note (Signed)
Mild to mod, for antibx course,  to f/u any worsening symptoms or concerns 

## 2019-02-12 DIAGNOSIS — U071 COVID-19: Secondary | ICD-10-CM | POA: Insufficient documentation

## 2019-02-13 ENCOUNTER — Other Ambulatory Visit: Payer: Self-pay

## 2019-02-13 DIAGNOSIS — Z20822 Contact with and (suspected) exposure to covid-19: Secondary | ICD-10-CM

## 2019-02-15 LAB — NOVEL CORONAVIRUS, NAA: SARS-CoV-2, NAA: DETECTED — AB

## 2019-03-10 ENCOUNTER — Other Ambulatory Visit: Payer: Self-pay

## 2019-03-10 DIAGNOSIS — Z20822 Contact with and (suspected) exposure to covid-19: Secondary | ICD-10-CM

## 2019-03-12 LAB — NOVEL CORONAVIRUS, NAA: SARS-CoV-2, NAA: NOT DETECTED

## 2019-03-16 ENCOUNTER — Other Ambulatory Visit: Payer: Self-pay

## 2019-03-20 ENCOUNTER — Encounter: Payer: Self-pay | Admitting: Endocrinology

## 2019-03-20 ENCOUNTER — Ambulatory Visit (INDEPENDENT_AMBULATORY_CARE_PROVIDER_SITE_OTHER): Payer: PRIVATE HEALTH INSURANCE | Admitting: Endocrinology

## 2019-03-20 ENCOUNTER — Other Ambulatory Visit: Payer: Self-pay

## 2019-03-20 DIAGNOSIS — E049 Nontoxic goiter, unspecified: Secondary | ICD-10-CM

## 2019-03-20 NOTE — Patient Instructions (Addendum)
Please continue the same levothyroxine.   You should take vitamin B-12 (non-prescription) 1 mg per day.   You should have the thyroid examined at least once per year.  Either Dr Jenny Reichmann or I would be happy to do this.

## 2019-03-20 NOTE — Progress Notes (Addendum)
Subjective:    Patient ID: [name], female    DOB: 01/12/1956, 63 y.o.   MRN: [id number]  HPI  telehealth visit today via doxy video visit.  Alternatives to telehealth are presented to this patient, and the patient agrees to the telehealth visit. Pt is advised of the cost of the visit, and agrees to this, also.   Patient is at home, and I am at the office.   Persons attending the telehealth visit: the patient and I.  Pt is referred by Dr [name], for nodular thyroid.  Pt was noted to have a nodule at the thyroid in.  she has had hypothyroidism since 2010.  she has no h/o XRT or surgery to the neck.  She reports slight discomfort at the ant neck, and slight assoc swelling.   Past Medical History:  Diagnosis Date  . ALLERGIC RHINITIS 11/02/2006  . Cervical disc disease 04/15/2011  . Chronic maxillary sinusitis 10/05/2008  . Chronic neck pain 04/15/2011  . GERD 11/02/2006  . HYPERLIPIDEMIA 01/10/2007  . Hyperlipidemia 01/10/2007   Qualifier: Diagnosis of  By: Savannah Reichmann MD, Hunt Oris   . HYPERTENSION 01/10/2007  . HYPOTHYROIDISM 11/02/2006  . NEPHROLITHIASIS, HX OF 01/10/2007  . PMR (polymyalgia rheumatica) ([place]) 11/10/2018  . SINUSITIS- ACUTE-NOS 04/03/2010    Past Surgical History:  Procedure Laterality Date  . ABDOMINAL HYSTERECTOMY  1996  . s/p selective nerve root block 2002     per Dr. Louanne Skye to right neck  . TONSILLECTOMY  1961    Social History   Socioeconomic History  . Marital status: Married    Spouse name: Not on file  . Number of children: 2  . Years of education: Not on file  . Highest education level: Not on file  Occupational History  . Occupation: ELIGIBILITY    Employer: [employer]  Tobacco Use  . Smoking status: Never Smoker  . Smokeless tobacco: Never Used  Substance and Sexual Activity  . Alcohol use: No  . Drug use: No  . Sexual activity: Yes    Partners: Male  Other Topics Concern  . Not on file  Social History Narrative  . Not on file   Social  Determinants of Health   Financial Resource Strain:   . Difficulty of Paying Living Expenses: Not on file  Food Insecurity:   . Worried About Charity fundraiser in the Last Year: Not on file  . Ran Out of Food in the Last Year: Not on file  Transportation Needs:   . Lack of Transportation (Medical): Not on file  . Lack of Transportation (Non-Medical): Not on file  Physical Activity:   . Days of Exercise per Week: Not on file  . Minutes of Exercise per Session: Not on file  Stress:   . Feeling of Stress : Not on file  Social Connections:   . Frequency of Communication with Friends and Family: Not on file  . Frequency of Social Gatherings with Friends and Family: Not on file  . Attends Religious Services: Not on file  . Active Member of Clubs or Organizations: Not on file  . Attends Archivist Meetings: Not on file  . Marital Status: Not on file  Intimate Partner Violence:   . Fear of Current or Ex-Partner: Not on file  . Emotionally Abused: Not on file  . Physically Abused: Not on file  . Sexually Abused: Not on file    Current Outpatient Medications on File Prior to Visit  Medication Sig Dispense Refill  . ALPRAZolam (XANAX) 0.5 MG tablet   2  . amLODipine (NORVASC) 5 MG tablet TAKE 1 TABLET(5 MG) BY MOUTH DAILY. 90 tablet 3  . aspirin EC 81 MG tablet Take 1 tablet (81 mg total) by mouth daily. 90 tablet 11  . citalopram (CELEXA) 10 MG tablet Take 1 tablet (10 mg total) by mouth daily. 90 tablet 3  . cyclobenzaprine (FLEXERIL) 5 MG tablet Take 1 tablet (5 mg total) by mouth 3 (three) times daily as needed for muscle spasms. 60 tablet 1  . estradiol (ESTRACE) 2 MG tablet Take 2 mg by mouth daily.    Marland Kitchen gabapentin (NEURONTIN) 100 MG capsule Take 1 capsule (100 mg total) by mouth 3 (three) times daily. 90 capsule 3  . HYDROcodone-acetaminophen (NORCO/VICODIN) 5-325 MG tablet Take 1 tablet by mouth every 6 (six) hours as needed. 30 tablet 0  . levothyroxine (SYNTHROID)  125 MCG tablet Take 1 tablet (125 mcg total) by mouth daily. 90 tablet 3  . lisinopril (ZESTRIL) 40 MG tablet TAKE 1 TABLET(40 MG) BY MOUTH DAILY 90 tablet 3  . predniSONE (DELTASONE) 10 MG tablet 3 tabs by mouth per day for 3 days,2tabs per day for 3 days,1tab per day for 3 days 18 tablet 0  . Vitamin D, Ergocalciferol, (DRISDOL) 1.25 MG (50000 UT) CAPS capsule Take 1 capsule (50,000 Units total) by mouth every 7 (seven) days. 12 capsule 0  . zolpidem (AMBIEN) 10 MG tablet TAKE 1 TABLET(10 MG) BY MOUTH AT BEDTIME AS NEEDED FOR SLEEP 30 tablet 5   No current facility-administered medications on file prior to visit.    No Known Allergies  Family History  Problem Relation Age of Onset  . Hypertension Other   . Diabetes Other   . Hypothyroidism Mother     There were no vitals taken for this visit.   Review of Systems Denies weight change, hoarseness, visual loss, chest pain, sob, cough, dysphagia, diarrhea, itching, flushing, easy bruising, depression, cold intolerance, headache, numbness, and rhinorrhea.      Objective:   Physical Exam   Lab Results  Component Value Date   TSH 2.10 11/10/2018   [place]: Mildly heterogeneous appearing and atrophic thyroid gland without discrete nodule or mass. Findings are nonspecific though could be seen in the setting of a chronic thyroiditis.   I have reviewed outside records, and summarized: Pt was noted to have goiter, and referred here.  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  She has polyimmunopathy.   Patient Instructions  Please continue the same levothyroxine.   You should take vitamin B-12 (non-prescription) 1 mg per day.   You should have the thyroid examined at least once per year.  Either Dr [name] or I would be happy to do this.

## 2019-04-27 ENCOUNTER — Other Ambulatory Visit: Payer: Self-pay | Admitting: Internal Medicine

## 2019-04-27 NOTE — Telephone Encounter (Signed)
Done erx 

## 2019-05-15 ENCOUNTER — Encounter: Payer: Self-pay | Admitting: Internal Medicine

## 2019-05-15 ENCOUNTER — Other Ambulatory Visit: Payer: Self-pay

## 2019-05-15 ENCOUNTER — Ambulatory Visit: Payer: PRIVATE HEALTH INSURANCE | Admitting: Internal Medicine

## 2019-05-15 VITALS — BP 146/76 | HR 87 | Temp 98.0°F | Ht 66.5 in | Wt 197.0 lb

## 2019-05-15 DIAGNOSIS — M353 Polymyalgia rheumatica: Secondary | ICD-10-CM

## 2019-05-15 DIAGNOSIS — E559 Vitamin D deficiency, unspecified: Secondary | ICD-10-CM | POA: Diagnosis not present

## 2019-05-15 DIAGNOSIS — R739 Hyperglycemia, unspecified: Secondary | ICD-10-CM

## 2019-05-15 DIAGNOSIS — E039 Hypothyroidism, unspecified: Secondary | ICD-10-CM

## 2019-05-15 DIAGNOSIS — E538 Deficiency of other specified B group vitamins: Secondary | ICD-10-CM | POA: Diagnosis not present

## 2019-05-15 DIAGNOSIS — I10 Essential (primary) hypertension: Secondary | ICD-10-CM

## 2019-05-15 LAB — TSH: TSH: 1.6 u[IU]/mL (ref 0.35–4.50)

## 2019-05-15 LAB — VITAMIN D 25 HYDROXY (VIT D DEFICIENCY, FRACTURES): VITD: 49.98 ng/mL (ref 30.00–100.00)

## 2019-05-15 LAB — HEMOGLOBIN A1C: Hgb A1c MFr Bld: 5.3 % (ref 4.6–6.5)

## 2019-05-15 LAB — T4, FREE: Free T4: 0.85 ng/dL (ref 0.60–1.60)

## 2019-05-15 LAB — VITAMIN B12: Vitamin B-12: 382 pg/mL (ref 211–911)

## 2019-05-15 MED ORDER — PREDNISONE 10 MG PO TABS: ORAL_TABLET | ORAL | 0 refills | Status: DC

## 2019-05-15 MED ORDER — HYDROCODONE-ACETAMINOPHEN 5-325 MG PO TABS: 1.0000 | ORAL_TABLET | Freq: Four times a day (QID) | ORAL | 0 refills | Status: DC | PRN

## 2019-05-15 NOTE — Progress Notes (Signed)
Subjective:    Patient ID: [name], female    DOB: May 14, 1955, 64 y.o.   MRN: [id number]  HPI   Here to f/u; overall doing ok,  Pt denies chest pain, increasing sob or doe, wheezing, orthopnea, PND, increased LE swelling, palpitations, dizziness or syncope.  Pt denies new neurological symptoms such as new headache, or facial or extremity weakness or numbness.  Pt denies polydipsia, polyuria, or low sugar episode.  Pt states overall good compliance with meds, mostly trying to follow appropriate diet, with wt overall stable,  but little exercise however. BP Readings from Last 3 Encounters:  05/15/19 (!) 146/76  11/10/18 (!) 171/82  11/10/18 (!) 142/88  S/p covid about 3 mo ago.  Got infection most likely from husband who got it from a friend. Missed f/u with rheum due to covid, next appt mar 2021, now with recurrent flare of bilateral shoulder pain more on the left than right Past Medical History:  Diagnosis Date  . ALLERGIC RHINITIS 11/02/2006  . Cervical disc disease 04/15/2011  . Chronic maxillary sinusitis 10/05/2008  . Chronic neck pain 04/15/2011  . GERD 11/02/2006  . HYPERLIPIDEMIA 01/10/2007  . Hyperlipidemia 01/10/2007   Qualifier: Diagnosis of  By: Jenny Reichmann MD, Hunt Oris   . HYPERTENSION 01/10/2007  . HYPOTHYROIDISM 11/02/2006  . NEPHROLITHIASIS, HX OF 01/10/2007  . PMR (polymyalgia rheumatica) ([place]) 11/10/2018  . SINUSITIS- ACUTE-NOS 04/03/2010   Past Surgical History:  Procedure Laterality Date  . ABDOMINAL HYSTERECTOMY  1996  . s/p selective nerve root block 2002     per Dr. Louanne Skye to right neck  . TONSILLECTOMY  1961    reports that she has never smoked. She has never used smokeless tobacco. She reports that she does not drink alcohol or use drugs. family history includes Diabetes in an other family member; Hypertension in an other family member; Hypothyroidism in her mother. No Known Allergies Current Outpatient Medications on File Prior to Visit  Medication Sig Dispense Refill    . ALPRAZolam (XANAX) 0.5 MG tablet   2  . amLODipine (NORVASC) 5 MG tablet TAKE 1 TABLET(5 MG) BY MOUTH DAILY. 90 tablet 3  . aspirin EC 81 MG tablet Take 1 tablet (81 mg total) by mouth daily. 90 tablet 11  . citalopram (CELEXA) 10 MG tablet Take 1 tablet (10 mg total) by mouth daily. 90 tablet 3  . cyclobenzaprine (FLEXERIL) 5 MG tablet Take 1 tablet (5 mg total) by mouth 3 (three) times daily as needed for muscle spasms. 60 tablet 1  . estradiol (ESTRACE) 2 MG tablet Take 2 mg by mouth daily.    Marland Kitchen gabapentin (NEURONTIN) 100 MG capsule Take 1 capsule (100 mg total) by mouth 3 (three) times daily. 90 capsule 3  . levothyroxine (SYNTHROID) 125 MCG tablet Take 1 tablet (125 mcg total) by mouth daily. 90 tablet 3  . lisinopril (ZESTRIL) 40 MG tablet TAKE 1 TABLET(40 MG) BY MOUTH DAILY 90 tablet 3  . zolpidem (AMBIEN) 10 MG tablet TAKE 1 TABLET(10 MG) BY MOUTH AT BEDTIME AS NEEDED FOR SLEEP 30 tablet 5   No current facility-administered medications on file prior to visit.   Review of Systems  Constitutional: Negative for other unusual diaphoresis or sweats HENT: Negative for ear discharge or swelling Eyes: Negative for other worsening visual disturbances Respiratory: Negative for stridor or other swelling  Gastrointestinal: Negative for worsening distension or other blood Genitourinary: Negative for retention or other urinary change Musculoskeletal: Negative for other MSK pain or  swelling Skin: Negative for color change or other new lesions Neurological: Negative for worsening tremors and other numbness  Psychiatric/Behavioral: Negative for worsening agitation or other fatigue All otherwise neg per pt     Objective:   Physical Exam BP (!) 146/76   Pulse 87   Temp 98 F (36.7 C)   Ht 5' 6.5" (1.689 m)   Wt 197 lb (89.4 kg)   SpO2 99%   BMI 31.32 kg/m  VS noted,  Constitutional: Pt appears in NAD HENT: Head: NCAT.  Right Ear: External ear normal.  Left Ear: External ear normal.   Eyes: . Pupils are equal, round, and reactive to light. Conjunctivae and EOM are normal Nose: without d/c or deformity Neck: Neck supple. Gross normal ROM Cardiovascular: Normal rate and regular rhythm.   Pulmonary/Chest: Effort normal and breath sounds without rales or wheezing.  Abd:  Soft, NT, ND, + BS, no organomegaly Neurological: Pt is alert. At baseline orientation, motor grossly intact Skin: Skin is warm. No rashes, other new lesions, no LE edema Psychiatric: Pt behavior is normal without agitation  All otherwise neg per pt \ Lab Results  Component Value Date   WBC 9.6 11/10/2018   HGB 12.1 11/10/2018   HCT 39.6 11/10/2018   PLT 278 11/10/2018   GLUCOSE 100 (H) 11/10/2018   CHOL 213 (H) 11/10/2018   TRIG 292.0 (H) 11/10/2018   HDL 68.40 11/10/2018   LDLDIRECT 115.0 11/10/2018   LDLCALC 79 06/29/2017   ALT 9 11/10/2018   AST 12 11/10/2018   NA 140 11/10/2018   K 3.6 11/10/2018   CL 103 11/10/2018   CREATININE 0.71 11/10/2018   BUN 13 11/10/2018   CO2 26 11/10/2018   TSH 1.60 05/15/2019   HGBA1C 5.3 05/15/2019      Assessment & Plan:

## 2019-05-15 NOTE — Assessment & Plan Note (Signed)
stable overall by history and exam, recent data reviewed with pt, and pt to continue medical treatment as before,  to f/u any worsening symptoms or concerns  

## 2019-05-15 NOTE — Assessment & Plan Note (Addendum)
stable overall by history and exam, recent data reviewed with pt, and pt to continue medical treatment as before,  to f/u any worsening symptoms or concerns  I spent 40 minutes preparing to see the patient by review of recent labs, imaging and procedures, obtaining and reviewing separately obtained history, communicating with the patient and family or caregiver, ordering medications, tests or procedures, and documenting clinical information in the EHR including the differential Dx, treatment, and any further evaluation and other management of hyperglycemia, PMR, hypothyroid, HTN, vit d def, vit b12 def

## 2019-05-15 NOTE — Patient Instructions (Addendum)
You are on the [facility] Vaccine Wait List  Ok to increase the amlodipine to 10 mg per day  Please take all new medication as prescribed - the prednisone  Please continue all other medications as before, and refills have been done if requested.  Please have the pharmacy call with any other refills you may need.  Please continue your efforts at being more active, low cholesterol diet, and weight control.  Please keep your appointments with your specialists as you may have planned  Please go to the LAB at the blood drawing area for the tests to be done  You will be contacted by phone if any changes need to be made immediately.  Otherwise, you will receive a letter about your results with an explanation, but please check with MyChart first.  Please remember to sign up for MyChart if you have not done so, as this will be important to you in the future with finding out test results, communicating by private email, and scheduling acute appointments online when needed.  Please make an Appointment to return in 6 months, or sooner if needed, also with Lab Appointment for testing done 3-5 days before at the [facility] (so this is for TWO appointments - please see the scheduling desk as you leave)

## 2019-05-15 NOTE — Assessment & Plan Note (Signed)
Mild uncontrolled, for increased amlodipine 10 qd, cont all other tx, f/u bp at home and next visit

## 2019-05-15 NOTE — Assessment & Plan Note (Signed)
Ok for predpac asd,  to f/u any worsening symptoms or concerns per rheum as planned

## 2019-05-15 NOTE — Assessment & Plan Note (Signed)
Cont oral replacement 

## 2019-06-19 ENCOUNTER — Telehealth: Payer: Self-pay

## 2019-06-19 NOTE — Telephone Encounter (Signed)
On visit  2.8.21 was told Amlodipine would be  10 mg   1.Medication Requested:amLODipine (NORVASC) 5 MG tablet  2. Pharmacy (Name, Street, [place], [place] - [address] AT [place]   3. On Med List: Yes  4. Last Visit with PCP: 2.8.21   5. Next visit date with PCP: no appt is made at this time    Agent: Please be advised that RX refills may take up to 3 business days. We ask that you follow-up with your pharmacy.

## 2019-06-20 ENCOUNTER — Encounter: Payer: Self-pay | Admitting: Internal Medicine

## 2019-06-20 ENCOUNTER — Other Ambulatory Visit: Payer: Self-pay

## 2019-06-20 MED ORDER — AMLODIPINE BESYLATE 10 MG PO TABS: ORAL_TABLET | ORAL | 3 refills | Status: DC

## 2019-06-20 NOTE — Telephone Encounter (Signed)
Please refill as per office routine med refill policy (all routine meds refilled for 3 mo or monthly per pt preference up to one year from last visit, then month to month grace period for 3 mo, then further med refills will have to be denied)  

## 2019-06-20 NOTE — Telephone Encounter (Signed)
Updated script faxed in today for patient.

## 2019-07-11 ENCOUNTER — Encounter: Payer: Self-pay | Admitting: Internal Medicine

## 2019-09-02 ENCOUNTER — Other Ambulatory Visit: Payer: Self-pay | Admitting: Internal Medicine

## 2019-11-08 ENCOUNTER — Other Ambulatory Visit: Payer: Self-pay | Admitting: Internal Medicine

## 2019-11-08 NOTE — Telephone Encounter (Signed)
Done erx 

## 2019-11-20 ENCOUNTER — Other Ambulatory Visit: Payer: Self-pay | Admitting: Internal Medicine

## 2019-11-28 ENCOUNTER — Ambulatory Visit: Payer: PRIVATE HEALTH INSURANCE | Admitting: Family Medicine

## 2019-12-14 ENCOUNTER — Ambulatory Visit (INDEPENDENT_AMBULATORY_CARE_PROVIDER_SITE_OTHER): Payer: PRIVATE HEALTH INSURANCE | Admitting: Family Medicine

## 2019-12-14 ENCOUNTER — Other Ambulatory Visit: Payer: Self-pay

## 2019-12-14 ENCOUNTER — Ambulatory Visit: Payer: Self-pay

## 2019-12-14 ENCOUNTER — Ambulatory Visit (INDEPENDENT_AMBULATORY_CARE_PROVIDER_SITE_OTHER): Payer: PRIVATE HEALTH INSURANCE

## 2019-12-14 ENCOUNTER — Encounter: Payer: Self-pay | Admitting: Family Medicine

## 2019-12-14 VITALS — BP 150/80 | HR 68 | Ht 66.5 in | Wt 200.0 lb

## 2019-12-14 DIAGNOSIS — M25551 Pain in right hip: Secondary | ICD-10-CM

## 2019-12-14 DIAGNOSIS — M5416 Radiculopathy, lumbar region: Secondary | ICD-10-CM

## 2019-12-14 MED ORDER — PREDNISONE 50 MG PO TABS: 50.0000 mg | ORAL_TABLET | Freq: Every day | ORAL | 0 refills | Status: DC

## 2019-12-14 MED ORDER — GABAPENTIN 300 MG PO CAPS: 300.0000 mg | ORAL_CAPSULE | Freq: Three times a day (TID) | ORAL | 3 refills | Status: DC | PRN

## 2019-12-14 NOTE — Progress Notes (Signed)
Rito Ehrlich, am serving as a Education administrator for Dr. [name].  [name] is a 64 y.o. female who presents to [facility] today for hip and knee pain.  She was last seen by Dr. Tamala Julian on 11/08/17 for polyarthralgia and was advised to use Vit D, turmeric, tart cherry and CoQ10.  Since then, pt reports bilateral hip pain on and off for four years. States R hip is the worst and is what brought her in. Pain radiates down the lateral R leg down to foot, has numbness and tingling, is unable to lay on her back or R side, hard time do any daily activity. This has been going on for a month or so did have an accident in July that she feel likes that made it worse. Had to slam on her break with left foot and then the collision happened. Taking aleve, Ice, heat. Pain located in her goin area and rates it a 3     Pertinent review of systems: No fevers or chills  Relevant historical information: Hypertension   Exam:  BP (!) 150/80 (BP Location: Left Arm, Patient Position: Sitting, Cuff Size: Normal)   Pulse 68   Ht 5' 6.5" (1.689 m)   Wt 200 lb (90.7 kg)   SpO2 98%   BMI 31.80 kg/m  General: Well Developed, well nourished, and in no acute distress.   MSK: L-spine normal-appearing Nontender midline.  Decreased lumbar motion.  Positive right-sided slump test.  Lower extremity strength is intact except noted below.  Reflexes equal and normal. Sensation is intact throughout.  Right hip normal-appearing Nontender. Normal motion with minimal pain with flexion and internal rotation. Hip abduction strength diminished 4/5.    Lab and Radiology Results  X-ray images right hip and L-spine obtained today personally independently reviewed.  Right hip: Minimal degenerative changes.  No acute fractures.  L-spine: DDD L5-S1 with mild neuroforaminal narrowing.  No acute fractures.  Await radiology review    Assessment and Plan: 64 y.o. female with right lower leg  pain.  Lumbar radiculopathy at L5 most likely diagnosis based on location of pain and positive slump test.  Patient does have degenerative changes in lumbar spine on x-ray.  Plan for physical therapy and gabapentin as well as prednisone course.  Patient also has some groin pain which could be coming from the anterior hip.  She has very minimal degenerative changes on x-ray per my read.  Again physical therapy may be helpful here along with prednisone.  If not improved consider diagnostic and therapeutic hip injection.  Recheck 4 to 6 weeks.    Orders Placed This Encounter  Procedures  . DG Lumbar Spine 2-3 Views    Standing Status:   Future    Number of Occurrences:   1    Standing Expiration Date:   12/13/2020    Order Specific Question:   Reason for Exam (SYMPTOM  OR DIAGNOSIS REQUIRED)    Answer:   eval right L5 radiculopathy    Order Specific Question:   Preferred imaging location?    Answer:   [facility]    Order Specific Question:   Radiology Contrast Protocol - do NOT remove file path    Answer:   [email or web address]  . DG HIP UNILAT WITH PELVIS 2-3 VIEWS RIGHT    Standing Status:   Future    Number of Occurrences:   1    Standing Expiration Date:   12/13/2020  Order Specific Question:   Reason for Exam (SYMPTOM  OR DIAGNOSIS REQUIRED)    Answer:   eval hip pain r    Order Specific Question:   Preferred imaging location?    Answer:   [facility]    Order Specific Question:   Radiology Contrast Protocol - do NOT remove file path    Answer:   [email or web address]  . Ambulatory referral to Physical Therapy    Referral Priority:   Routine    Referral Type:   Physical Medicine    Referral Reason:   Specialty Services Required    Requested Specialty:   Physical Therapy    Number of Visits Requested:   1   Meds ordered this encounter  Medications  . predniSONE  (DELTASONE) 50 MG tablet    Sig: Take 1 tablet (50 mg total) by mouth daily.    Dispense:  5 tablet    Refill:  0  . gabapentin (NEURONTIN) 300 MG capsule    Sig: Take 1 capsule (300 mg total) by mouth 3 (three) times daily as needed (nerve pain).    Dispense:  90 capsule    Refill:  3     Discussed warning signs or symptoms. Please see discharge instructions. Patient expresses understanding.   The above documentation has been reviewed and is accurate and complete [name], M.D.

## 2019-12-14 NOTE — Patient Instructions (Signed)
Thank you for coming in today. Get xray today.  Plan for PT.  Take prednisone. Take gabapentin as needed.  Recheck in 4-6 weeks with me.  Let me know sooner if this is not working.    Sciatica  Sciatica is pain, weakness, tingling, or loss of feeling (numbness) along the sciatic nerve. The sciatic nerve starts in the lower back and goes down the back of each leg. Sciatica usually goes away on its own or with treatment. Sometimes, sciatica may come back (recur). What are the causes? This condition happens when the sciatic nerve is pinched or has pressure put on it. This may be the result of:  A disk in between the bones of the spine bulging out too far (herniated disk).  Changes in the spinal disks that occur with aging.  A condition that affects a muscle in the butt.  Extra bone growth near the sciatic nerve.  A break (fracture) of the area between your hip bones (pelvis).  Pregnancy.  Tumor. This is rare. What increases the risk? You are more likely to develop this condition if you:  Play sports that put pressure or stress on the spine.  Have poor strength and ease of movement (flexibility).  Have had a back injury in the past.  Have had back surgery.  Sit for long periods of time.  Do activities that involve bending or lifting over and over again.  Are very overweight (obese). What are the signs or symptoms? Symptoms can vary from mild to very bad. They may include:  Any of these problems in the lower back, leg, hip, or butt: ? Mild tingling, loss of feeling, or dull aches. ? Burning sensations. ? Sharp pains.  Loss of feeling in the back of the calf or the sole of the foot.  Leg weakness.  Very bad back pain that makes it hard to move. These symptoms may get worse when you cough, sneeze, or laugh. They may also get worse when you sit or stand for long periods of time. How is this treated? This condition often gets better without any treatment. However,  treatment may include:  Changing or cutting back on physical activity when you have pain.  Doing exercises and stretching.  Putting ice or heat on the affected area.  Medicines that help: ? To relieve pain and swelling. ? To relax your muscles.  Shots (injections) of medicines that help to relieve pain, irritation, and swelling.  Surgery. Follow these instructions at home: Medicines  Take over-the-counter and prescription medicines only as told by your doctor.  Ask your doctor if the medicine prescribed to you: ? Requires you to avoid driving or using heavy machinery. ? Can cause trouble pooping (constipation). You may need to take these steps to prevent or treat trouble pooping:  Drink enough fluids to keep your pee (urine) pale yellow.  Take over-the-counter or prescription medicines.  Eat foods that are high in fiber. These include beans, whole grains, and fresh fruits and vegetables.  Limit foods that are high in fat and sugar. These include fried or sweet foods. Managing pain      If told, put ice on the affected area. ? Put ice in a plastic bag. ? Place a towel between your skin and the bag. ? Leave the ice on for 20 minutes, 2-3 times a day.  If told, put heat on the affected area. Use the heat source that your doctor tells you to use, such as a moist heat pack  or a heating pad. ? Place a towel between your skin and the heat source. ? Leave the heat on for 20-30 minutes. ? Remove the heat if your skin turns bright red. This is very important if you are unable to feel pain, heat, or cold. You may have a greater risk of getting burned. Activity   Return to your normal activities as told by your doctor. Ask your doctor what activities are safe for you.  Avoid activities that make your symptoms worse.  Take short rests during the day. ? When you rest for a long time, do some physical activity or stretching between periods of rest. ? Avoid sitting for a long  time without moving. Get up and move around at least one time each hour.  Exercise and stretch regularly, as told by your doctor.  Do not lift anything that is heavier than 10 lb (4.5 kg) while you have symptoms of sciatica. ? Avoid lifting heavy things even when you do not have symptoms. ? Avoid lifting heavy things over and over.  When you lift objects, always lift in a way that is safe for your body. To do this, you should: ? Bend your knees. ? Keep the object close to your body. ? Avoid twisting. General instructions  Stay at a healthy weight.  Wear comfortable shoes that support your feet. Avoid wearing high heels.  Avoid sleeping on a mattress that is too soft or too hard. You might have less pain if you sleep on a mattress that is firm enough to support your back.  Keep all follow-up visits as told by your doctor. This is important. Contact a doctor if:  You have pain that: ? Wakes you up when you are sleeping. ? Gets worse when you lie down. ? Is worse than the pain you have had in the past. ? Lasts longer than 4 weeks.  You lose weight without trying. Get help right away if:  You cannot control when you pee (urinate) or poop (have a bowel movement).  You have weakness in any of these areas and it gets worse: ? Lower back. ? The area between your hip bones. ? Butt. ? Legs.  You have redness or swelling of your back.  You have a burning feeling when you pee. Summary  Sciatica is pain, weakness, tingling, or loss of feeling (numbness) along the sciatic nerve.  This condition happens when the sciatic nerve is pinched or has pressure put on it.  Sciatica can cause pain, tingling, or loss of feeling (numbness) in the lower back, legs, hips, and butt.  Treatment often includes rest, exercise, medicines, and putting ice or heat on the affected area. This information is not intended to replace advice given to you by your health care provider. Make sure you discuss  any questions you have with your health care provider. Document Revised: 04/11/2018 Document Reviewed: 04/11/2018 Elsevier Patient Education  [place].

## 2019-12-15 NOTE — Progress Notes (Signed)
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

## 2019-12-15 NOTE — Progress Notes (Signed)
X-ray lumbar spine shows medium arthritis at L5-S1.  This would correspond to pinched nerve going down your leg.  If not better with physical therapy and prednisone and gabapentin MRI will show this better.

## 2019-12-28 ENCOUNTER — Encounter: Payer: Self-pay | Admitting: Family Medicine

## 2019-12-28 DIAGNOSIS — M5416 Radiculopathy, lumbar region: Secondary | ICD-10-CM

## 2020-01-12 ENCOUNTER — Other Ambulatory Visit: Payer: Self-pay | Admitting: Internal Medicine

## 2020-01-12 NOTE — Telephone Encounter (Signed)
Please refill as per office routine med refill policy (all routine meds refilled for 3 mo or monthly per pt preference up to one year from last visit, then month to month grace period for 3 mo, then further med refills will have to be denied)  

## 2020-01-14 ENCOUNTER — Other Ambulatory Visit: Payer: Self-pay | Admitting: Internal Medicine

## 2020-01-18 ENCOUNTER — Ambulatory Visit: Payer: PRIVATE HEALTH INSURANCE | Admitting: Family Medicine

## 2020-01-20 ENCOUNTER — Ambulatory Visit (INDEPENDENT_AMBULATORY_CARE_PROVIDER_SITE_OTHER): Payer: PRIVATE HEALTH INSURANCE

## 2020-01-20 ENCOUNTER — Other Ambulatory Visit: Payer: Self-pay

## 2020-01-20 DIAGNOSIS — M5416 Radiculopathy, lumbar region: Secondary | ICD-10-CM

## 2020-01-20 DIAGNOSIS — M48061 Spinal stenosis, lumbar region without neurogenic claudication: Secondary | ICD-10-CM

## 2020-01-22 NOTE — Progress Notes (Signed)
MRI lumbar spine shows severe spinal stenosis at L4-5.  This likely would explain the pain in the right lower leg. Mild spinal stenosis at L3-4 could possibly cause some hip pain or anterior thigh pain. We will discuss these results in further detail at follow-up appointment scheduled on October 21

## 2020-01-25 ENCOUNTER — Encounter: Payer: Self-pay | Admitting: Family Medicine

## 2020-01-25 ENCOUNTER — Other Ambulatory Visit: Payer: Self-pay

## 2020-01-25 ENCOUNTER — Ambulatory Visit (INDEPENDENT_AMBULATORY_CARE_PROVIDER_SITE_OTHER): Payer: PRIVATE HEALTH INSURANCE | Admitting: Family Medicine

## 2020-01-25 VITALS — BP 132/78 | HR 94 | Ht 66.5 in | Wt 206.0 lb

## 2020-01-25 DIAGNOSIS — M5416 Radiculopathy, lumbar region: Secondary | ICD-10-CM | POA: Diagnosis not present

## 2020-01-25 DIAGNOSIS — M25552 Pain in left hip: Secondary | ICD-10-CM

## 2020-01-25 DIAGNOSIS — M48061 Spinal stenosis, lumbar region without neurogenic claudication: Secondary | ICD-10-CM | POA: Diagnosis not present

## 2020-01-25 DIAGNOSIS — M25551 Pain in right hip: Secondary | ICD-10-CM

## 2020-01-25 NOTE — Patient Instructions (Signed)
Thank you for coming in today.  Please call [facility] at [phone] to schedule your spine injection.   Ok to take gabapentin as needed.   Let me know if not better.   If the hip does not improve we can work on them specifically.   STOP Aspirin now.   Epidural Steroid Injection Patient Information  Description: The epidural space surrounds the nerves as they exit the spinal cord.  In some patients, the nerves can be compressed and inflamed by a bulging disc or a tight spinal canal (spinal stenosis).  By injecting steroids into the epidural space, we can bring irritated nerves into direct contact with a potentially helpful medication.  These steroids act directly on the irritated nerves and can reduce swelling and inflammation which often leads to decreased pain.  Epidural steroids may be injected anywhere along the spine and from the neck to the low back depending upon the location of your pain.   After numbing the skin with local anesthetic (like Novocaine), a small needle is passed into the epidural space slowly.  You may experience a sensation of pressure while this is being done.  The entire block usually last less than 10 minutes.  Conditions which may be treated by epidural steroids:   Low back and leg pain  Neck and arm pain  Spinal stenosis  Post-laminectomy syndrome  Herpes zoster (shingles) pain  Pain from compression fractures  Preparation for the injection:  1. Do not eat any solid food or dairy products within 8 hours of your appointment.  2. You may drink clear liquids up to 3 hours before appointment.  Clear liquids include water, black coffee, juice or soda.  No milk or cream please. 3. You may take your regular medication, including pain medications, with a sip of water before your appointment  Diabetics should hold regular insulin (if taken separately) and take 1/2 normal NPH dos the morning of the procedure.  Carry some sugar containing items with  you to your appointment. 4. A driver must accompany you and be prepared to drive you home after your procedure.  5. Bring all your current medications with your. 6. An IV may be inserted and sedation may be given at the discretion of the physician.   7. A blood pressure cuff, EKG and other monitors will often be applied during the procedure.  Some patients may need to have extra oxygen administered for a short period. 8. You will be asked to provide medical information, including your allergies, prior to the procedure.  We must know immediately if you are taking blood thinners (like Coumadin/Warfarin)  Or if you are allergic to IV iodine contrast (dye). We must know if you could possible be pregnant.  Possible side-effects:  Bleeding from needle site  Infection (rare, may require surgery)  Nerve injury (rare)  Numbness & tingling (temporary)  Difficulty urinating (rare, temporary)  Spinal headache ( a headache worse with upright posture)  Light -headedness (temporary)  Pain at injection site (several days)  Decreased blood pressure (temporary)  Weakness in arm/leg (temporary)  Pressure sensation in back/neck (temporary)  Call if you experience:  Fever/chills associated with headache or increased back/neck pain.  Headache worsened by an upright position.  New onset weakness or numbness of an extremity below the injection site  Hives or difficulty breathing (go to the emergency room)  Inflammation or drainage at the infection site  Severe back/neck pain  Any new symptoms which are concerning to you  Please  note:  Although the local anesthetic injected can often make your back or neck feel good for several hours after the injection, the pain will likely return.  It takes 3-7 days for steroids to work in the epidural space.  You may not notice any pain relief for at least that one week.  If effective, we will often do a series of three injections spaced 3-6 weeks apart  to maximally decrease your pain.  After the initial series, we generally will wait several months before considering a repeat injection of the same type.

## 2020-01-25 NOTE — Progress Notes (Signed)
[name] is a 64 y.o. female who presents to [place] [facility] today for follow up of MRI of Lumbar Spine. Patient was last seen by Dr. Georgina Snell on 12/14/2019 for R hip pain and low back pain. 64 y.o. female with right lower leg pain.  Lumbar radiculopathy at L5 most likely diagnosis based on location of pain and positive slump test.  Patient does have degenerative changes in lumbar spine on x-ray.  Plan for physical therapy and gabapentin as well as prednisone course.Patient also has some groin pain which could be coming from the anterior hip.  She has very minimal degenerative changes on x-ray per my read.  Again physical therapy may be helpful here along with prednisone.  If not improved consider diagnostic and therapeutic hip injection.  Since last visit patient states that she is still experiencing some pain here and there. Patient took the prednisone that was given and it helped but as soon as she stopped taking it the pain came back like it was before. Is interested in the hip injection. Is taking the gabapentin and it helps a little but not a whole lot.   She notes primarily she has pain radiating to the right lateral hip to the lateral calf to the anterior calf and foot.  She does note some anterior lateral hip pain bilaterally.  No weakness or numbness distally.   Pertinent review of systems: No fevers or chills  Relevant historical information: Hypertension   Exam:  BP 132/78 (BP Location: Left Arm, Patient Position: Sitting, Cuff Size: Normal)   Pulse 94   Ht 5' 6.5" (1.689 m)   Wt 206 lb (93.4 kg)   SpO2 98%   BMI 32.75 kg/m  General: Well Developed, well nourished, and in no acute distress.   MSK: Normal gait    Lab and Radiology Results DG Lumbar Spine 2-3 Views  Result Date: 12/14/2019 CLINICAL DATA:  Right hip pain EXAM: LUMBAR SPINE - 2-3 VIEW COMPARISON:  None. FINDINGS: Five non rib-bearing lumbar type vertebra. Trace  anterolisthesis L4 on L5. Vertebral body heights are maintained. Moderate disc space narrowing and degenerative change at L5-S1. IMPRESSION: Moderate degenerative changes at L5-S1. Electronically Signed   By: Donavan Foil M.D.   On: 12/14/2019 21:46   MR Lumbar Spine Wo Contrast  Result Date: 01/20/2020 CLINICAL DATA:  Low back pain, progressive neurologic deficit. EXAM: MRI LUMBAR SPINE WITHOUT CONTRAST TECHNIQUE: Multiplanar, multisequence MR imaging of the lumbar spine was performed. No intravenous contrast was administered. COMPARISON:  Plain films December 14, 2019. FINDINGS: Segmentation:  Standard. Alignment:  Small anterolisthesis of L4 over L5, degenerative. Vertebrae: No fracture, evidence of discitis, or bone lesion. Endplate degenerative changes at L5-S1. Conus medullaris and cauda equina: Conus extends to the L1 level. Conus and cauda equina appear normal. Paraspinal and other soft tissues: Negative. Disc levels: T12-L1: No spinal canal or neural foraminal stenosis. L1-2: Facet degenerative changes. No spinal canal or neural foraminal stenosis. L2-3: Small disc bulge and mild facet degenerative changes without significant spinal canal or neural foraminal stenosis. L3-4: Disc bulge, facet degenerative change ligamentum flavum redundancy resulting in mild spinal canal stenosis. No significant neural foraminal narrowing. L4-5: Disc bulge with prominent hypertrophic facet degenerative changes and ligamentum flavum redundancy resulting in severe spinal canal stenosis. No neural foraminal narrowing. L5-S1: Shallow disc bulge and facet degenerative changes without significant spinal canal or neural foraminal stenosis. IMPRESSION: 1. Multilevel degenerative changes of the lumbar spine, worst at  L4-L5 where there is severe spinal canal stenosis. 2. Mild spinal canal stenosis at L3-L4. 3. No significant neural foraminal narrowing at any level. Electronically Signed   By: Pedro Earls M.D.    On: 01/20/2020 10:43   DG HIP UNILAT WITH PELVIS 2-3 VIEWS RIGHT  Result Date: 12/14/2019 CLINICAL DATA:  Right-sided hip pain EXAM: DG HIP (WITH OR WITHOUT PELVIS) 2-3V RIGHT COMPARISON:  None. FINDINGS: SI joints are patent. Pubic symphysis and rami are intact. No fracture or malalignment. Mild degenerative changes. IMPRESSION: Negative. Electronically Signed   By: Donavan Foil M.D.   On: 12/14/2019 21:47   I, [name], personally (independently) visualized and performed the interpretation of the images attached in this note. Significant spinal stenosis on MRI at L4-L5.  No significant hip DJD.    Assessment and Plan: 64 y.o. female with right leg pain due to what looks like L5 radiculopathy.  She has significant spinal stenosis at L4-5.  Plan for epidural steroid injection at this level.  Okay to take gabapentin.  Patient is currently taking 81 mg of aspirin so she has been asked to discontinue it now and can restart after injection.  It should be least 1 week before her injection after stopping aspirin.  She also has some bilateral hip pain originally right worse than left now about equal.  Pain is located at the lateral hip and into the anterior hip.  Some of the pain is due to hip abductor tendinopathy but some of the pain could also be hip impingement or labrum tear.  She does not have significant DJD on x-ray of her hip and pelvis about a month ago. If this pain does not improve after epidural steroid injection patient should return to clinic to have this looked at more thoroughly proceed with possible intra-articular diagnostic and therapeutic injection.    Orders Placed This Encounter  Procedures  . DG INJECT DIAG/THERA/INC NEEDLE/CATH/PLC EPI/LUMB/SAC W/IMG    Standing Status:   Future    Standing Expiration Date:   01/24/2021    Order Specific Question:   Reason for Exam (SYMPTOM  OR DIAGNOSIS REQUIRED)    Answer:   Right L5 radicular injection at L4-L5 level. Technique per  radiology    Order Specific Question:   Preferred Imaging Location?    Answer:   [id number]    Order Specific Question:   Radiology Contrast Protocol - do NOT remove file path    Answer:   [email or web address]   No orders of the defined types were placed in this encounter.    Discussed warning signs or symptoms. Please see discharge instructions. Patient expresses understanding.   The above documentation has been reviewed and is accurate and complete [name], M.D.

## 2020-02-01 ENCOUNTER — Ambulatory Visit
Admission: RE | Admit: 2020-02-01 | Discharge: 2020-02-01 | Disposition: A | Discharge status: Home / Self Care | Payer: PRIVATE HEALTH INSURANCE | Source: Ambulatory Visit | Attending: Family Medicine | Admitting: Family Medicine

## 2020-02-01 DIAGNOSIS — M48061 Spinal stenosis, lumbar region without neurogenic claudication: Secondary | ICD-10-CM

## 2020-02-01 DIAGNOSIS — M5416 Radiculopathy, lumbar region: Secondary | ICD-10-CM

## 2020-02-01 MED ORDER — IOPAMIDOL (ISOVUE-M 200) INJECTION 41%
1.0000 mL | Freq: Once | INTRAMUSCULAR | Status: AC
  Administered 2020-02-01: 1 mL via EPIDURAL

## 2020-02-01 MED ORDER — METHYLPREDNISOLONE ACETATE 40 MG/ML INJ SUSP (RADIOLOG
120.0000 mg | Freq: Once | INTRAMUSCULAR | Status: AC
  Administered 2020-02-01: 120 mg via EPIDURAL

## 2020-02-01 NOTE — Discharge Instructions (Signed)

## 2020-02-06 ENCOUNTER — Other Ambulatory Visit: Payer: Self-pay | Admitting: Internal Medicine

## 2020-04-06 DIAGNOSIS — D649 Anemia, unspecified: Secondary | ICD-10-CM

## 2020-04-06 HISTORY — DX: Anemia, unspecified: D64.9

## 2020-05-18 ENCOUNTER — Other Ambulatory Visit: Payer: Self-pay | Admitting: Internal Medicine

## 2020-05-18 NOTE — Telephone Encounter (Signed)
ambien done erx x 1 mo  Please to contact pt - due for rov for further refills

## 2020-07-11 ENCOUNTER — Other Ambulatory Visit: Payer: Self-pay

## 2020-07-11 ENCOUNTER — Ambulatory Visit (INDEPENDENT_AMBULATORY_CARE_PROVIDER_SITE_OTHER): Payer: Medicare HMO | Admitting: Internal Medicine

## 2020-07-11 ENCOUNTER — Encounter: Payer: Self-pay | Admitting: Internal Medicine

## 2020-07-11 VITALS — BP 136/72 | HR 81 | Ht 66.5 in | Wt 202.0 lb

## 2020-07-11 DIAGNOSIS — E7800 Pure hypercholesterolemia, unspecified: Secondary | ICD-10-CM

## 2020-07-11 DIAGNOSIS — Z23 Encounter for immunization: Secondary | ICD-10-CM

## 2020-07-11 DIAGNOSIS — E559 Vitamin D deficiency, unspecified: Secondary | ICD-10-CM | POA: Diagnosis not present

## 2020-07-11 DIAGNOSIS — F32A Depression, unspecified: Secondary | ICD-10-CM

## 2020-07-11 DIAGNOSIS — R739 Hyperglycemia, unspecified: Secondary | ICD-10-CM

## 2020-07-11 DIAGNOSIS — I10 Essential (primary) hypertension: Secondary | ICD-10-CM

## 2020-07-11 DIAGNOSIS — F419 Anxiety disorder, unspecified: Secondary | ICD-10-CM | POA: Diagnosis not present

## 2020-07-11 DIAGNOSIS — E538 Deficiency of other specified B group vitamins: Secondary | ICD-10-CM

## 2020-07-11 DIAGNOSIS — E039 Hypothyroidism, unspecified: Secondary | ICD-10-CM | POA: Diagnosis not present

## 2020-07-11 DIAGNOSIS — Z0000 Encounter for general adult medical examination without abnormal findings: Secondary | ICD-10-CM

## 2020-07-11 DIAGNOSIS — Z0001 Encounter for general adult medical examination with abnormal findings: Secondary | ICD-10-CM

## 2020-07-11 DIAGNOSIS — Z1211 Encounter for screening for malignant neoplasm of colon: Secondary | ICD-10-CM | POA: Diagnosis not present

## 2020-07-11 LAB — BASIC METABOLIC PANEL
BUN: 14 mg/dL (ref 6–23)
CO2: 29 mEq/L (ref 19–32)
Calcium: 9.5 mg/dL (ref 8.4–10.5)
Chloride: 101 mEq/L (ref 96–112)
Creatinine, Ser: 0.7 mg/dL (ref 0.40–1.20)
GFR: 91.05 mL/min (ref >60.00)
Glucose, Bld: 77 mg/dL (ref 70–99)
Potassium: 4.8 mEq/L (ref 3.5–5.1)
Sodium: 137 mEq/L (ref 135–145)

## 2020-07-11 LAB — URINALYSIS, ROUTINE W REFLEX MICROSCOPIC
Bilirubin Urine: NEGATIVE
Hgb urine dipstick: NEGATIVE
Ketones, ur: NEGATIVE
Leukocytes,Ua: NEGATIVE
Nitrite: NEGATIVE
Specific Gravity, Urine: 1.02 (ref 1.000–1.030)
Total Protein, Urine: NEGATIVE
Urine Glucose: NEGATIVE
Urobilinogen, UA: 0.2 (ref 0.0–1.0)
pH: 6 (ref 5.0–8.0)

## 2020-07-11 LAB — LIPID PANEL
Cholesterol: 204 mg/dL — ABNORMAL HIGH (ref 0–200)
HDL: 66.9 mg/dL (ref >39.00)
NonHDL: 137.36
Total CHOL/HDL Ratio: 3
Triglycerides: 239 mg/dL — ABNORMAL HIGH (ref 0.0–149.0)
VLDL: 47.8 mg/dL — ABNORMAL HIGH (ref 0.0–40.0)

## 2020-07-11 LAB — VITAMIN B12: Vitamin B-12: 652 pg/mL (ref 211–911)

## 2020-07-11 LAB — CBC WITH DIFFERENTIAL/PLATELET
Basophils Absolute: 0.1 10*3/uL (ref 0.0–0.1)
Basophils Relative: 0.7 % (ref 0.0–3.0)
Eosinophils Absolute: 0.1 10*3/uL (ref 0.0–0.7)
Eosinophils Relative: 1.9 % (ref 0.0–5.0)
HCT: 34.2 % — ABNORMAL LOW (ref 36.0–46.0)
Hemoglobin: 11.1 g/dL — ABNORMAL LOW (ref 12.0–15.0)
Lymphocytes Relative: 37.8 % (ref 12.0–46.0)
Lymphs Abs: 2.9 10*3/uL (ref 0.7–4.0)
MCHC: 32.5 g/dL (ref 30.0–36.0)
MCV: 80.2 fl (ref 78.0–100.0)
Monocytes Absolute: 0.5 10*3/uL (ref 0.1–1.0)
Monocytes Relative: 7 % (ref 3.0–12.0)
Neutro Abs: 4.1 10*3/uL (ref 1.4–7.7)
Neutrophils Relative %: 52.6 % (ref 43.0–77.0)
Platelets: 281 10*3/uL (ref 150.0–400.0)
RBC: 4.27 Mil/uL (ref 3.87–5.11)
RDW: 15.8 % — ABNORMAL HIGH (ref 11.5–15.5)
WBC: 7.7 10*3/uL (ref 4.0–10.5)

## 2020-07-11 LAB — HEPATIC FUNCTION PANEL
ALT: 11 U/L (ref 0–35)
AST: 15 U/L (ref 0–37)
Albumin: 4.2 g/dL (ref 3.5–5.2)
Alkaline Phosphatase: 113 U/L (ref 39–117)
Bilirubin, Direct: 0 mg/dL (ref 0.0–0.3)
Total Bilirubin: 0.3 mg/dL (ref 0.2–1.2)
Total Protein: 6.6 g/dL (ref 6.0–8.3)

## 2020-07-11 LAB — HEMOGLOBIN A1C: Hgb A1c MFr Bld: 5.3 % (ref 4.6–6.5)

## 2020-07-11 LAB — TSH: TSH: 5.7 u[IU]/mL — ABNORMAL HIGH (ref 0.35–4.50)

## 2020-07-11 LAB — LDL CHOLESTEROL, DIRECT: Direct LDL: 109 mg/dL

## 2020-07-11 LAB — VITAMIN D 25 HYDROXY (VIT D DEFICIENCY, FRACTURES): VITD: 45.02 ng/mL (ref 30.00–100.00)

## 2020-07-11 MED ORDER — HYDROCODONE-ACETAMINOPHEN 5-325 MG PO TABS: 1.0000 | ORAL_TABLET | Freq: Four times a day (QID) | ORAL | 0 refills | Status: DC | PRN

## 2020-07-11 MED ORDER — LEVOTHYROXINE SODIUM 125 MCG PO TABS: ORAL_TABLET | ORAL | 3 refills | Status: DC

## 2020-07-11 MED ORDER — CYCLOBENZAPRINE HCL 5 MG PO TABS: ORAL_TABLET | ORAL | 1 refills | Status: DC

## 2020-07-11 MED ORDER — LISINOPRIL 40 MG PO TABS: ORAL_TABLET | ORAL | 3 refills | Status: DC

## 2020-07-11 MED ORDER — ZOLPIDEM TARTRATE 10 MG PO TABS: ORAL_TABLET | ORAL | 1 refills | Status: DC

## 2020-07-11 MED ORDER — AMLODIPINE BESYLATE 10 MG PO TABS
ORAL_TABLET | ORAL | 3 refills | Status: DC
Start: 2020-07-11 — End: 2021-07-17

## 2020-07-11 MED ORDER — CITALOPRAM HYDROBROMIDE 10 MG PO TABS
ORAL_TABLET | ORAL | 3 refills | Status: DC
Start: 2020-07-11 — End: 2021-07-17
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
## 2020-07-11 NOTE — Assessment & Plan Note (Addendum)
Pt tried to wean off with worsening symptoms, now restarting, o/w Stable, to contine SSRI

## 2020-07-11 NOTE — Patient Instructions (Addendum)
You had the Tdap tetanus shot today  Let us know to send your yearly maintenance medications to [facility] when you have it set up  You will be contacted regarding the referral for: colonoscopy  Please continue all other medications as before, and refills have been done if requested.  Please have the pharmacy call with any other refills you may need.  Please continue your efforts at being more active, low cholesterol diet, and weight control.  You are otherwise up to date with prevention measures today.  Please keep your appointments with your specialists as you may have planned  Please go to the LAB at the blood drawing area for the tests to be done  You will be contacted by phone if any changes need to be made immediately.  Otherwise, you will receive a letter about your results with an explanation, but please check with MyChart first.  Please remember to sign up for MyChart if you have not done so, as this will be important to you in the future with finding out test results, communicating by private email, and scheduling acute appointments online when needed.  Please make an Appointment to return for your 1 year visit, or sooner if needed, with Lab testing by Appointment as well, to be done about 3-5 days before at the [facility] (so this is for TWO appointments - please see the scheduling desk as you leave)  Due to the ongoing Covid 19 pandemic, our lab now requires an appointment for any labs done at our office.  If you need labs done and do not have an appointment, please call our office ahead of time to schedule before presenting to the lab for your testing.

## 2020-07-11 NOTE — Assessment & Plan Note (Signed)
Lab Results  Component Value Date   VITAMINB12 652 07/11/2020   Stable, cont oral replacement - b12 1000 mcg qd

## 2020-07-11 NOTE — Assessment & Plan Note (Signed)
Lab Results  Component Value Date   TSH 5.70 (H) 07/11/2020   Stable, pt to continue levothyroxine  Current Outpatient Medications (Endocrine & Metabolic):  .  estradiol (ESTRACE) 2 MG tablet, Take 2 mg by mouth daily. Marland Kitchen  levothyroxine (SYNTHROID) 125 MCG tablet, TAKE 1 TABLET(125 MCG) BY MOUTH DAILY  Current Outpatient Medications (Cardiovascular):  .  amLODipine (NORVASC) 10 MG tablet, TAKE 1 TABLET(10 MG) BY MOUTH DAILY. Marland Kitchen  lisinopril (ZESTRIL) 40 MG tablet, TAKE 1 TABLET BY MOUTH EVERY DAY   Current Outpatient Medications (Analgesics):  .  aspirin EC 81 MG tablet, Take 1 tablet (81 mg total) by mouth daily. Marland Kitchen  HYDROcodone-acetaminophen (NORCO/VICODIN) 5-325 MG tablet, Take 1 tablet by mouth every 6 (six) hours as needed.   Current Outpatient Medications (Other):  Marland Kitchen  ALPRAZolam (XANAX) 0.5 MG tablet,  .  gabapentin (NEURONTIN) 300 MG capsule, Take 1 capsule (300 mg total) by mouth 3 (three) times daily as needed (nerve pain). .  citalopram (CELEXA) 10 MG tablet, TAKE 1 TABLET(10 MG) BY MOUTH DAILY .  cyclobenzaprine (FLEXERIL) 5 MG tablet, TAKE 1 TABLET(5 MG) BY MOUTH THREE TIMES DAILY AS NEEDED FOR MUSCLE SPASMS .  zolpidem (AMBIEN) 10 MG tablet, 1 tab by mouth at bedtime as needed

## 2020-07-11 NOTE — Assessment & Plan Note (Addendum)
Lab Results  Component Value Date   LDLCALC 79 06/29/2017   Mild elevated goal ldl < 70, pt to continue current low chol diet, declines statin for now

## 2020-07-11 NOTE — Addendum Note (Signed)
Addended by: Biagio Borg on: 07/11/2020 09:15 PM   Modules accepted: Orders

## 2020-07-11 NOTE — Assessment & Plan Note (Signed)
BP Readings from Last 3 Encounters:  07/11/20 136/72  02/01/20 (!) 141/67  01/25/20 132/78   Stable, pt to continue medical treatment norvasc, ACEI

## 2020-07-11 NOTE — Assessment & Plan Note (Signed)
Age and sex appropriate education and counseling updated with regular exercise and diet Referrals for preventative services - for colonoscopy Immunizations addressed - for Tdap, declines covid booster, will consider shingles shot if ok with insurance Smoking counseling  - none needed Evidence for depression or other mood disorder - none significant Most recent labs reviewed. I have personally reviewed and have noted: 1) the patient's medical and social history 2) The patient's current medications and supplements 3) The patient's height, weight, and BMI have been recorded in the chart

## 2020-07-11 NOTE — Assessment & Plan Note (Signed)
Last vitamin D Lab Results  Component Value Date   VD25OH 45.02 07/11/2020   Stable, cont oral replacement

## 2020-07-11 NOTE — Assessment & Plan Note (Signed)
Lab Results  Component Value Date   HGBA1C 5.3 07/11/2020   Stable, pt to continue current medical treatment  - diet, wt control

## 2020-07-12 ENCOUNTER — Other Ambulatory Visit: Payer: Self-pay | Admitting: Internal Medicine

## 2020-07-12 ENCOUNTER — Other Ambulatory Visit (INDEPENDENT_AMBULATORY_CARE_PROVIDER_SITE_OTHER): Payer: Medicare HMO

## 2020-07-12 DIAGNOSIS — D649 Anemia, unspecified: Secondary | ICD-10-CM | POA: Diagnosis not present

## 2020-07-12 LAB — FERRITIN: Ferritin: 7.3 ng/mL — ABNORMAL LOW (ref 10.0–291.0)

## 2020-07-12 LAB — IRON: Iron: 46 ug/dL (ref 42–145)

## 2020-07-12 MED ORDER — POLYSACCHARIDE IRON COMPLEX 150 MG PO CAPS: 150.0000 mg | ORAL_CAPSULE | Freq: Every day | ORAL | 0 refills | Status: DC

## 2020-08-08 NOTE — Progress Notes (Signed)
Subjective:    Patient ID: [name], female    DOB: [birth year], 65 y.o.   MRN: [id number]  HPI She is here for an acute visit for cold symptoms-congestion and pressure under her eyes for 3 weeks.    Her symptoms started approximately 2 weeks ago.  She has a history of sinus infections.  She is experiencing chills, mild congestion, PND, sinus pain, pressure, dry mild cough, nausea, myalgias, headache.   She has tried taking flonase, claritin, coricidin, nyquil, tylenol       Medications and allergies reviewed with patient and updated if appropriate.  Patient Active Problem List   Diagnosis Date Noted  . Vitamin D deficiency 05/15/2019  . Vitamin B12 deficiency 05/15/2019  . COVID-19 02/12/2019  . Hyperglycemia 11/13/2018  . PMR (polymyalgia rheumatica) ([facility]) 11/10/2018  . Anxiety and depression 11/10/2018  . Acute pyelonephritis 04/29/2018  . Proximal limb weakness 10/14/2017  . Bilateral shoulder pain 10/14/2017  . Insomnia 10/14/2017  . Weight loss 10/14/2017  . Enlarged thyroid gland 10/14/2017  . Right hip pain 06/10/2016  . Lower back pain 03/24/2014  . Renal stone 06/06/2013  . Acute sinus infection 02/14/2013  . Cervical disc disease 04/15/2011  . Chronic neck pain 04/15/2011  . Encounter for well adult exam with abnormal findings 07/29/2010  . HYPERSOMNIA 02/06/2009  . Chronic maxillary sinusitis 10/05/2008  . THRUSH 05/18/2008  . PLANTAR FASCIITIS 05/18/2008  . GANGLION CYST, WRIST, LEFT 03/06/2008  . Hyperlipidemia 01/10/2007  . Essential hypertension 01/10/2007  . CERVICAL RADICULOPATHY 01/10/2007  . NEPHROLITHIASIS, HX OF 01/10/2007  . Hypothyroidism 11/02/2006  . ALLERGIC RHINITIS 11/02/2006  . GERD 11/02/2006    Current Outpatient Medications on File Prior to Visit  Medication Sig Dispense Refill  . ALPRAZolam (XANAX) 0.5 MG tablet   2  . amLODipine (NORVASC) 10 MG tablet TAKE 1 TABLET(10 MG) BY MOUTH DAILY. 90 tablet 3  . aspirin EC  81 MG tablet Take 1 tablet (81 mg total) by mouth daily. 90 tablet 11  . citalopram (CELEXA) 10 MG tablet TAKE 1 TABLET(10 MG) BY MOUTH DAILY 90 tablet 3  . cyclobenzaprine (FLEXERIL) 5 MG tablet TAKE 1 TABLET(5 MG) BY MOUTH THREE TIMES DAILY AS NEEDED FOR MUSCLE SPASMS 60 tablet 1  . estradiol (ESTRACE) 2 MG tablet Take 2 mg by mouth daily.    Marland Kitchen gabapentin (NEURONTIN) 300 MG capsule Take 1 capsule (300 mg total) by mouth 3 (three) times daily as needed (nerve pain). 90 capsule 3  . HYDROcodone-acetaminophen (NORCO/VICODIN) 5-325 MG tablet Take 1 tablet by mouth every 6 (six) hours as needed. 30 tablet 0  . iron polysaccharides (NU-IRON) 150 MG capsule Take 1 capsule (150 mg total) by mouth daily. 90 capsule 0  . levothyroxine (SYNTHROID) 125 MCG tablet TAKE 1 TABLET(125 MCG) BY MOUTH DAILY 90 tablet 3  . lisinopril (ZESTRIL) 40 MG tablet TAKE 1 TABLET BY MOUTH EVERY DAY 90 tablet 3  . zolpidem (AMBIEN) 10 MG tablet 1 tab by mouth at bedtime as needed 90 tablet 1   No current facility-administered medications on file prior to visit.    Past Medical History:  Diagnosis Date  . ALLERGIC RHINITIS 11/02/2006  . Cervical disc disease 04/15/2011  . Chronic maxillary sinusitis 10/05/2008  . Chronic neck pain 04/15/2011  . GERD 11/02/2006  . HYPERLIPIDEMIA 01/10/2007  . Hyperlipidemia 01/10/2007   Qualifier: Diagnosis of  By: Jenny Reichmann MD, Hunt Oris   . HYPERTENSION 01/10/2007  . HYPOTHYROIDISM 11/02/2006  .  NEPHROLITHIASIS, HX OF 01/10/2007  . PMR (polymyalgia rheumatica) ([place]) 11/10/2018  . SINUSITIS- ACUTE-NOS 04/03/2010    Past Surgical History:  Procedure Laterality Date  . ABDOMINAL HYSTERECTOMY  1996  . s/p selective nerve root block 2002     per Dr. Louanne Skye to right neck  . TONSILLECTOMY  1961    Social History   Socioeconomic History  . Marital status: Married    Spouse name: Not on file  . Number of children: 2  . Years of education: Not on file  . Highest education level: Not on file   Occupational History  . Occupation: ELIGIBILITY    Employer: [employer]  Tobacco Use  . Smoking status: Never Smoker  . Smokeless tobacco: Never Used  Substance and Sexual Activity  . Alcohol use: No  . Drug use: No  . Sexual activity: Yes    Partners: Male  Other Topics Concern  . Not on file  Social History Narrative  . Not on file   Social Determinants of Health   Financial Resource Strain: Not on file  Food Insecurity: Not on file  Transportation Needs: Not on file  Physical Activity: Not on file  Stress: Not on file  Social Connections: Not on file    Family History  Problem Relation Age of Onset  . Hypertension Other   . Diabetes Other   . Hypothyroidism Mother     Review of Systems  Constitutional: Positive for chills. Negative for fever.  HENT: Positive for congestion (mild), postnasal drip, sinus pressure (teeth pain) and sinus pain. Negative for ear pain (popping, mild pain) and sore throat (scratchy).   Respiratory: Positive for cough (mild, dry). Negative for shortness of breath and wheezing.   Gastrointestinal: Positive for nausea (occ, from pND).  Musculoskeletal: Positive for myalgias.  Neurological: Positive for headaches. Negative for dizziness and light-headedness.       Objective:   Vitals:   08/09/20 0753  BP: 124/68  Pulse: 91  Temp: 98.3 F (36.8 C)  SpO2: 97%   BP Readings from Last 3 Encounters:  08/09/20 124/68  07/11/20 136/72  02/01/20 (!) 141/67   Wt Readings from Last 3 Encounters:  08/09/20 198 lb 12.8 oz (90.2 kg)  07/11/20 202 lb (91.6 kg)  01/25/20 206 lb (93.4 kg)   Body mass index is 31.61 kg/m.   Physical Exam    GENERAL APPEARANCE: Appears stated age, well appearing, NAD EYES: conjunctiva clear, no icterus HENT: bilateral tympanic membranes and ear canals normal, oropharynx with no erythema or exudates, trachea midline, no cervical or supraclavicular lymphadenopathy LUNGS: Unlabored breathing, good  air entry bilaterally, clear to auscultation without wheeze or crackles CARDIOVASCULAR: Normal S1,S2 , no edema SKIN: Warm, dry      Assessment & Plan:    See Problem List for Assessment and Plan of chronic medical problems.    This visit occurred during the SARS-CoV-2 public health emergency.  Safety protocols were in place, including screening questions prior to the visit, additional usage of staff PPE, and extensive cleaning of exam room while observing appropriate contact time as indicated for disinfecting solutions.

## 2020-08-09 ENCOUNTER — Encounter: Payer: Self-pay | Admitting: Internal Medicine

## 2020-08-09 ENCOUNTER — Ambulatory Visit (INDEPENDENT_AMBULATORY_CARE_PROVIDER_SITE_OTHER): Payer: Medicare HMO | Admitting: Internal Medicine

## 2020-08-09 ENCOUNTER — Other Ambulatory Visit: Payer: Self-pay

## 2020-08-09 DIAGNOSIS — J0191 Acute recurrent sinusitis, unspecified: Secondary | ICD-10-CM | POA: Diagnosis not present

## 2020-08-09 MED ORDER — AMOXICILLIN-POT CLAVULANATE 875-125 MG PO TABS: 1.0000 | ORAL_TABLET | Freq: Two times a day (BID) | ORAL | 0 refills | Status: DC

## 2020-08-09 NOTE — Patient Instructions (Signed)
Take the antibiotic as prescribed - complete the entire course.  Use the cough syrup as needed.  Continue over the counter cold medication, advil and tylenol.  Increase your fluids and rest.    Call if no improvement       Sinusitis, Adult Sinusitis is inflammation of your sinuses. Sinuses are hollow spaces in the bones around your face. Your sinuses are located:  Around your eyes.  In the middle of your forehead.  Behind your nose.  In your cheekbones. Mucus normally drains out of your sinuses. When your nasal tissues become inflamed or swollen, mucus can become trapped or blocked. This allows bacteria, viruses, and fungi to grow, which leads to infection. Most infections of the sinuses are caused by a virus. Sinusitis can develop quickly. It can last for up to 4 weeks (acute) or for more than 12 weeks (chronic). Sinusitis often develops after a cold. What are the causes? This condition is caused by anything that creates swelling in the sinuses or stops mucus from draining. This includes:  Allergies.  Asthma.  Infection from bacteria or viruses.  Deformities or blockages in your nose or sinuses.  Abnormal growths in the nose (nasal polyps).  Pollutants, such as chemicals or irritants in the air.  Infection from fungi (rare). What increases the risk? You are more likely to develop this condition if you:  Have a weak body defense system (immune system).  Do a lot of swimming or diving.  Overuse nasal sprays.  Smoke. What are the signs or symptoms? The main symptoms of this condition are pain and a feeling of pressure around the affected sinuses. Other symptoms include:  Stuffy nose or congestion.  Thick drainage from your nose.  Swelling and warmth over the affected sinuses.  Headache.  Upper toothache.  A cough that may get worse at night.  Extra mucus that collects in the throat or the back of the nose (postnasal drip).  Decreased sense of smell and  taste.  Fatigue.  A fever.  Sore throat.  Bad breath. How is this diagnosed? This condition is diagnosed based on:  Your symptoms.  Your medical history.  A physical exam.  Tests to find out if your condition is acute or chronic. This may include: ? Checking your nose for nasal polyps. ? Viewing your sinuses using a device that has a light (endoscope). ? Testing for allergies or bacteria. ? Imaging tests, such as an MRI or CT scan. In rare cases, a bone biopsy may be done to rule out more serious types of fungal sinus disease. How is this treated? Treatment for sinusitis depends on the cause and whether your condition is chronic or acute.  If caused by a virus, your symptoms should go away on their own within 10 days. You may be given medicines to relieve symptoms. They include: ? Medicines that shrink swollen nasal passages (topical intranasal decongestants). ? Medicines that treat allergies (antihistamines). ? A spray that eases inflammation of the nostrils (topical intranasal corticosteroids). ? Rinses that help get rid of thick mucus in your nose (nasal saline washes).  If caused by bacteria, your health care provider may recommend waiting to see if your symptoms improve. Most bacterial infections will get better without antibiotic medicine. You may be given antibiotics if you have: ? A severe infection. ? A weak immune system.  If caused by narrow nasal passages or nasal polyps, you may need to have surgery. Follow these instructions at home: Medicines  Take, use,  or apply over-the-counter and prescription medicines only as told by your health care provider. These may include nasal sprays.  If you were prescribed an antibiotic medicine, take it as told by your health care provider. Do not stop taking the antibiotic even if you start to feel better. Hydrate and humidify  Drink enough fluid to keep your urine pale yellow. Staying hydrated will help to thin your  mucus.  Use a cool mist humidifier to keep the humidity level in your home above 50%.  Inhale steam for 10-15 minutes, 3-4 times a day, or as told by your health care provider. You can do this in the bathroom while a hot shower is running.  Limit your exposure to cool or dry air.   Rest  Rest as much as possible.  Sleep with your head raised (elevated).  Make sure you get enough sleep each night. General instructions  Apply a warm, moist washcloth to your face 3-4 times a day or as told by your health care provider. This will help with discomfort.  Wash your hands often with soap and water to reduce your exposure to germs. If soap and water are not available, use hand sanitizer.  Do not smoke. Avoid being around people who are smoking (secondhand smoke).  Keep all follow-up visits as told by your health care provider. This is important.   Contact a health care provider if:  You have a fever.  Your symptoms get worse.  Your symptoms do not improve within 10 days. Get help right away if:  You have a severe headache.  You have persistent vomiting.  You have severe pain or swelling around your face or eyes.  You have vision problems.  You develop confusion.  Your neck is stiff.  You have trouble breathing. Summary  Sinusitis is soreness and inflammation of your sinuses. Sinuses are hollow spaces in the bones around your face.  This condition is caused by nasal tissues that become inflamed or swollen. The swelling traps or blocks the flow of mucus. This allows bacteria, viruses, and fungi to grow, which leads to infection.  If you were prescribed an antibiotic medicine, take it as told by your health care provider. Do not stop taking the antibiotic even if you start to feel better.  Keep all follow-up visits as told by your health care provider. This is important. This information is not intended to replace advice given to you by your health care provider. Make sure  you discuss any questions you have with your health care provider. Document Revised: 08/23/2017 Document Reviewed: 08/23/2017 Elsevier Patient Education  2021 [employer].

## 2020-08-09 NOTE — Assessment & Plan Note (Signed)
Acute Ongoing for almost 3 weeks Likely bacterial  Start Augmentin 875-125 mg BID x 10 day otc cold medications Rest, fluid Call if no improvement

## 2020-09-19 ENCOUNTER — Other Ambulatory Visit: Payer: Self-pay | Admitting: Family Medicine

## 2020-09-19 DIAGNOSIS — M25551 Pain in right hip: Secondary | ICD-10-CM

## 2020-09-19 DIAGNOSIS — M5416 Radiculopathy, lumbar region: Secondary | ICD-10-CM

## 2020-09-19 MED ORDER — GABAPENTIN 300 MG PO CAPS: 300.0000 mg | ORAL_CAPSULE | Freq: Three times a day (TID) | ORAL | 3 refills | Status: DC | PRN

## 2020-09-19 NOTE — Telephone Encounter (Signed)
Received a fax from [facility].  Request refill for new prescription for gabapentin to the mail order pharmacy.

## 2020-09-27 ENCOUNTER — Ambulatory Visit (AMBULATORY_SURGERY_CENTER): Payer: Medicare HMO | Admitting: *Deleted

## 2020-09-27 ENCOUNTER — Other Ambulatory Visit: Payer: Self-pay

## 2020-09-27 VITALS — Ht 66.5 in | Wt 193.0 lb

## 2020-09-27 DIAGNOSIS — Z1211 Encounter for screening for malignant neoplasm of colon: Secondary | ICD-10-CM

## 2020-09-27 MED ORDER — SUPREP BOWEL PREP KIT 17.5-3.13-1.6 GM/177ML PO SOLN: 1.0000 | Freq: Once | ORAL | 0 refills | Status: AC

## 2020-09-27 NOTE — Progress Notes (Signed)

## 2020-10-11 ENCOUNTER — Other Ambulatory Visit: Payer: Self-pay

## 2020-10-11 ENCOUNTER — Ambulatory Visit (AMBULATORY_SURGERY_CENTER): Payer: Medicare HMO | Admitting: Gastroenterology

## 2020-10-11 ENCOUNTER — Encounter: Payer: Self-pay | Admitting: Gastroenterology

## 2020-10-11 VITALS — BP 111/71 | HR 69 | Temp 97.5°F | Resp 15 | Ht 66.5 in | Wt 193.0 lb

## 2020-10-11 DIAGNOSIS — D125 Benign neoplasm of sigmoid colon: Secondary | ICD-10-CM

## 2020-10-11 DIAGNOSIS — Z1211 Encounter for screening for malignant neoplasm of colon: Secondary | ICD-10-CM | POA: Diagnosis not present

## 2020-10-11 DIAGNOSIS — K635 Polyp of colon: Secondary | ICD-10-CM | POA: Diagnosis not present

## 2020-10-11 MED ORDER — SODIUM CHLORIDE 0.9 % IV SOLN: 500.0000 mL | Freq: Once | INTRAVENOUS | Status: DC

## 2020-10-11 NOTE — Patient Instructions (Signed)
YOU HAD AN ENDOSCOPIC PROCEDURE TODAY AT THE [facility]:   Refer to the procedure report that was given to you for any specific questions about what was found during the examination.  If the procedure report does not answer your questions, please call your gastroenterologist to clarify.  If you requested that your care partner not be given the details of your procedure findings, then the procedure report has been included in a sealed envelope for you to review at your convenience later.  YOU SHOULD EXPECT: Some feelings of bloating in the abdomen. Passage of more gas than usual.  Walking can help get rid of the air that was put into your GI tract during the procedure and reduce the bloating. If you had a lower endoscopy (such as a colonoscopy or flexible sigmoidoscopy) you may notice spotting of blood in your stool or on the toilet paper. If you underwent a bowel prep for your procedure, you may not have a normal bowel movement for a few days.  Please Note:  You might notice some irritation and congestion in your nose or some drainage.  This is from the oxygen used during your procedure.  There is no need for concern and it should clear up in a day or so.  SYMPTOMS TO REPORT IMMEDIATELY:   Following lower endoscopy (colonoscopy or flexible sigmoidoscopy):  Excessive amounts of blood in the stool  Significant tenderness or worsening of abdominal pains  Swelling of the abdomen that is new, acute  Fever of 100F or higher   Following upper endoscopy (EGD)  Vomiting of blood or coffee ground material  New chest pain or pain under the shoulder blades  Painful or persistently difficult swallowing  New shortness of breath  Fever of 100F or higher  Black, tarry-looking stools  For urgent or emergent issues, a gastroenterologist can be reached at any hour by calling [phone]. Do not use MyChart messaging for urgent concerns.    DIET:  We do recommend a small meal at first, but  then you may proceed to your regular diet.  Drink plenty of fluids but you should avoid alcoholic beverages for 24 hours.  ACTIVITY:  You should plan to take it easy for the rest of today and you should NOT DRIVE or use heavy machinery until tomorrow (because of the sedation medicines used during the test).    FOLLOW UP: Our staff will call the number listed on your records 48-72 hours following your procedure to check on you and address any questions or concerns that you may have regarding the information given to you following your procedure. If we do not reach you, we will leave a message.  We will attempt to reach you two times.  During this call, we will ask if you have developed any symptoms of COVID 19. If you develop any symptoms (ie: fever, flu-like symptoms, shortness of breath, cough etc.) before then, please call [phone].  If you test positive for Covid 19 in the 2 weeks post procedure, please call and report this information to us.    If any biopsies were taken you will be contacted by phone or by letter within the next 1-3 weeks.  Please call us at [phone] if you have not heard about the biopsies in 3 weeks.    SIGNATURES/CONFIDENTIALITY: You and/or your care partner have signed paperwork which will be entered into your electronic medical record.  These signatures attest to the fact that that the information above on   your After Visit Summary has been reviewed and is understood.  Full responsibility of the confidentiality of this discharge information lies with you and/or your care-partner. 

## 2020-10-11 NOTE — Progress Notes (Signed)
pt tolerated well. VSS. awake and to recovery. Report given to RN.  

## 2020-10-11 NOTE — Progress Notes (Signed)
Called to room to assist during endoscopic procedure.  Patient ID and intended procedure confirmed with present staff. Received instructions for my participation in the procedure from the performing physician.  

## 2020-10-11 NOTE — Op Note (Signed)
[facility] Patient Name: [name] Procedure Date: 10/11/2020 8:39 AM MRN: [id number] Endoscopist: Ladene Artist , MD Age: 65 Referring MD:  Date of Birth: 1955/07/17 Gender: Female Account #: [id number] Procedure:                Colonoscopy Indications:              Screening for colorectal malignant neoplasm Medicines:                Monitored Anesthesia Care Procedure:                Pre-Anesthesia Assessment:                           - Prior to the procedure, a History and Physical                            was performed, and patient medications and                            allergies were reviewed. The patient's tolerance of                            previous anesthesia was also reviewed. The risks                            and benefits of the procedure and the sedation                            options and risks were discussed with the patient.                            All questions were answered, and informed consent                            was obtained. Prior Anticoagulants: The patient has                            taken no previous anticoagulant or antiplatelet                            agents. ASA Grade Assessment: II - A patient with                            mild systemic disease. After reviewing the risks                            and benefits, the patient was deemed in                            satisfactory condition to undergo the procedure.                           After obtaining informed consent, the colonoscope  was passed under direct vision. Throughout the                            procedure, the patient's blood pressure, pulse, and                            oxygen saturations were monitored continuously. The                            [id number] was introduced through the anus and                            advanced to the the cecum, identified by                            appendiceal orifice and  ileocecal valve. The                            ileocecal valve, appendiceal orifice, and rectum                            were photographed. The quality of the bowel                            preparation was excellent. The colonoscopy was                            performed without difficulty. The patient tolerated                            the procedure well. Scope In: 8:43:21 AM Scope Out: 9:03:03 AM Scope Withdrawal Time: 0 hours 16 minutes 41 seconds  Total Procedure Duration: 0 hours 19 minutes 42 seconds  Findings:                 The perianal and digital rectal examinations were                            normal.                           A 6 mm polyp was found in the sigmoid colon. The                            polyp was sessile. The polyp was removed with a                            cold snare. Resection and retrieval were complete.                           A few small-mouthed diverticula were found in the                            left colon. There was no evidence of diverticular  bleeding.                           The exam was otherwise without abnormality on                            direct and retroflexion views. Complications:            No immediate complications. Estimated blood loss:                            None. Estimated Blood Loss:     Estimated blood loss: none. Impression:               - One 6 mm polyp in the sigmoid colon, removed with                            a cold snare. Resected and retrieved.                           - Mild left colon diverticulosis.                           - The examination was otherwise normal on direct                            and retroflexion views. Recommendation:           - Repeat colonoscopy after studies are complete for                            surveillance based on pathology results.                           - Patient has a contact number available for                             emergencies. The signs and symptoms of potential                            delayed complications were discussed with the                            patient. Return to normal activities tomorrow.                            Written discharge instructions were provided to the                            patient.                           - High fiber diet.                           - Continue present medications.                           -  Await pathology results. Ladene Artist, MD 10/11/2020 9:06:50 AM This report has been signed electronically.

## 2020-10-11 NOTE — Progress Notes (Signed)
Medical history reviewed with no changes noted. VS assessed by C.W 

## 2020-10-15 ENCOUNTER — Telehealth: Payer: Self-pay

## 2020-10-15 NOTE — Telephone Encounter (Signed)
  Follow up Call-  Call back number 10/11/2020  Post procedure Call Back phone  # [phone]  Permission to leave phone message Yes  Some recent data might be hidden     Patient questions:  Do you have a fever, pain , or abdominal swelling? No. Pain Score  0 *  Have you tolerated food without any problems? Yes.    Have you been able to return to your normal activities? Yes.    Do you have any questions about your discharge instructions: Diet   No. Medications  No. Follow up visit  No.  Do you have questions or concerns about your Care? No.  Actions: * If pain score is 4 or above: No action needed, pain <4.

## 2020-10-21 ENCOUNTER — Encounter: Payer: Self-pay | Admitting: Gastroenterology

## 2020-10-24 DIAGNOSIS — Z1231 Encounter for screening mammogram for malignant neoplasm of breast: Secondary | ICD-10-CM | POA: Diagnosis not present

## 2020-10-24 DIAGNOSIS — Z124 Encounter for screening for malignant neoplasm of cervix: Secondary | ICD-10-CM | POA: Diagnosis not present

## 2020-10-24 DIAGNOSIS — Z6831 Body mass index (BMI) 31.0-31.9, adult: Secondary | ICD-10-CM | POA: Diagnosis not present

## 2020-11-26 ENCOUNTER — Other Ambulatory Visit: Payer: Self-pay

## 2020-11-26 ENCOUNTER — Ambulatory Visit (INDEPENDENT_AMBULATORY_CARE_PROVIDER_SITE_OTHER): Payer: Medicare HMO | Admitting: Internal Medicine

## 2020-11-26 ENCOUNTER — Encounter: Payer: Self-pay | Admitting: Internal Medicine

## 2020-11-26 VITALS — BP 126/70 | HR 87 | Temp 98.1°F | Ht 66.5 in | Wt 195.0 lb

## 2020-11-26 DIAGNOSIS — L989 Disorder of the skin and subcutaneous tissue, unspecified: Secondary | ICD-10-CM | POA: Diagnosis not present

## 2020-11-26 DIAGNOSIS — E7800 Pure hypercholesterolemia, unspecified: Secondary | ICD-10-CM

## 2020-11-26 DIAGNOSIS — R739 Hyperglycemia, unspecified: Secondary | ICD-10-CM | POA: Diagnosis not present

## 2020-11-26 DIAGNOSIS — I10 Essential (primary) hypertension: Secondary | ICD-10-CM | POA: Diagnosis not present

## 2020-11-26 DIAGNOSIS — E039 Hypothyroidism, unspecified: Secondary | ICD-10-CM | POA: Diagnosis not present

## 2020-11-26 DIAGNOSIS — D509 Iron deficiency anemia, unspecified: Secondary | ICD-10-CM | POA: Insufficient documentation

## 2020-11-26 NOTE — Assessment & Plan Note (Signed)
Lab Results  Component Value Date   TSH 5.70 (H) 07/11/2020   Mild uncontrolled recent, pt to continue levothyroxine and f/u lab

## 2020-11-26 NOTE — Assessment & Plan Note (Signed)
BP Readings from Last 3 Encounters:  11/26/20 126/70  10/11/20 111/71  08/09/20 124/68   Stable, pt to continue medical treatment amlodipine, lisinopril

## 2020-11-26 NOTE — Assessment & Plan Note (Signed)
Lab Results  Component Value Date   HGBA1C 5.3 07/11/2020   Stable, pt to continue current medical treatment  - diet

## 2020-11-26 NOTE — Assessment & Plan Note (Signed)
Lab Results  Component Value Date   LDLCALC 79 06/29/2017   Mild uncontrolled, pt to continue current low chol diet, declines statin fornow

## 2020-11-26 NOTE — Assessment & Plan Note (Addendum)
No overt bleeding, finished 3 mo iron supplement, now for f/u labs including iron, ferritin, cbc

## 2020-11-26 NOTE — Patient Instructions (Signed)
We can watch the skin lesion to the left upper back for now, but please call for Dermatology referral if you believe at any time it is changing or enlarging  Please continue all other medications as before, and refills have been done if requested.  Please have the pharmacy call with any other refills you may need.  Please continue your efforts at being more active, low cholesterol diet, and weight control.  Please keep your appointments with your specialists as you may have planned  Please go to the [facility] on Thursday as you mentioned for the lab testing ordered today  You will be contacted by phone if any changes need to be made immediately.  Otherwise, you will receive a letter about your results with an explanation, but please check with MyChart first.  Please remember to sign up for MyChart if you have not done so, as this will be important to you in the future with finding out test results, communicating by private email, and scheduling acute appointments online when needed.

## 2020-11-26 NOTE — Progress Notes (Signed)
Patient ID: [name], female   DOB: [birth year], 65 y.o.   MRN: [id number]        Chief Complaint: follow up HTN, HLD and iron deficiency       HPI:  [name] is a 65 y.o. female here overall doing well, no overt bleeding, Denies worsening reflux, abd pain, dysphagia, n/v, bowel change or blood.  Pt denies chest pain, increased sob or doe, wheezing, orthopnea, PND, increased LE swelling, palpitations, dizziness or syncope.   Pt denies polydipsia, polyuria, or new focal neuro s/s.  Tolerating new statin well.  BP < 140/90 at home.  Trying to follow low chol diet  Denies hyper or hypo thyroid symptoms such as voice, skin or hair change.  No new complaints  except has a dark skin lesion noticed by the daughter, no change she is aware, not sure how long has been there.         Wt Readings from Last 3 Encounters:  11/26/20 195 lb (88.5 kg)  10/11/20 193 lb (87.5 kg)  09/27/20 193 lb (87.5 kg)   BP Readings from Last 3 Encounters:  11/26/20 126/70  10/11/20 111/71  08/09/20 124/68         Past Medical History:  Diagnosis Date   ALLERGIC RHINITIS 11/02/2006   Allergy    Anemia 2022   been taking Iron   Arthritis    Cervical disc disease 04/15/2011   Chronic maxillary sinusitis 10/05/2008   Chronic neck pain 04/15/2011   GERD 11/02/2006   HYPERLIPIDEMIA 01/10/2007   Hyperlipidemia 01/10/2007   Qualifier: Diagnosis of  By: Jenny Reichmann MD, [facility] Oris    HYPERTENSION 01/10/2007   HYPOTHYROIDISM 11/02/2006   NEPHROLITHIASIS, HX OF 01/10/2007   PMR (polymyalgia rheumatica) ([place]) 11/10/2018   SINUSITIS- ACUTE-NOS 04/03/2010   Past Surgical History:  Procedure Laterality Date   ABDOMINAL HYSTERECTOMY  04/06/1994   COLONOSCOPY     PARTIAL HYSTERECTOMY     has ovaries   s/p selective nerve root block 2002     per Dr. Louanne Skye to right neck   TONSILLECTOMY  04/07/1959   TONSILLECTOMY     age 37   TUBAL LIGATION      reports that she has never smoked. She has never used smokeless tobacco.  She reports that she does not drink alcohol and does not use drugs. family history includes Colon polyps in her father; Diabetes in an other family member; Hypertension in an other family member; Hypothyroidism in her mother. No Known Allergies Current Outpatient Medications on File Prior to Visit  Medication Sig Dispense Refill   ALPRAZolam (XANAX) 0.5 MG tablet   2   amLODipine (NORVASC) 10 MG tablet TAKE 1 TABLET(10 MG) BY MOUTH DAILY. 90 tablet 3   aspirin EC 81 MG tablet Take 1 tablet (81 mg total) by mouth daily. 90 tablet 11   citalopram (CELEXA) 10 MG tablet TAKE 1 TABLET(10 MG) BY MOUTH DAILY 90 tablet 3   estradiol (ESTRACE) 2 MG tablet Take 2 mg by mouth daily.     gabapentin (NEURONTIN) 300 MG capsule Take 1 capsule (300 mg total) by mouth 3 (three) times daily as needed (nerve pain). 180 capsule 3   iron polysaccharides (NU-IRON) 150 MG capsule Take 1 capsule (150 mg total) by mouth daily. 90 capsule 0   levothyroxine (SYNTHROID) 125 MCG tablet TAKE 1 TABLET(125 MCG) BY MOUTH DAILY 90 tablet 3   lisinopril (ZESTRIL) 40 MG tablet TAKE 1 TABLET BY MOUTH EVERY DAY  90 tablet 3   zolpidem (AMBIEN) 10 MG tablet 1 tab by mouth at bedtime as needed 90 tablet 1   No current facility-administered medications on file prior to visit.        ROS:  All others reviewed and negative.  Objective        PE:  BP 126/70 (BP Location: Left Arm, Patient Position: Sitting, Cuff Size: Normal)   Pulse 87   Temp 98.1 F (36.7 C) (Oral)   Ht 5' 6.5" (1.689 m)   Wt 195 lb (88.5 kg)   SpO2 97%   BMI 31.00 kg/m                 Constitutional: Pt appears in NAD               HENT: Head: NCAT.                Right Ear: External ear normal.                 Left Ear: External ear normal.                Eyes: . Pupils are equal, round, and reactive to light. Conjunctivae and EOM are normal               Nose: without d/c or deformity               Neck: Neck supple. Gross normal ROM                Cardiovascular: Normal rate and regular rhythm.                 Pulmonary/Chest: Effort normal and breath sounds without rales or wheezing.                Abd:  Soft, NT, ND, + BS, no organomegaly               Neurological: Pt is alert. At baseline orientation, motor grossly intact               Skin:  LE edema - none, left post shoulder with 1/2 x 1/4 cm homogenous dark lesion slightly raised, but no irregular edges, smooth, oval benign apperaing               Psychiatric: Pt behavior is normal without agitation   Micro: none  Cardiac tracings I have personally interpreted today:  none  Pertinent Radiological findings (summarize): none   Lab Results  Component Value Date   WBC 7.7 07/11/2020   HGB 11.1 (L) 07/11/2020   HCT 34.2 (L) 07/11/2020   PLT 281.0 07/11/2020   GLUCOSE 77 07/11/2020   CHOL 204 (H) 07/11/2020   TRIG 239.0 (H) 07/11/2020   HDL 66.90 07/11/2020   LDLDIRECT 109.0 07/11/2020   LDLCALC 79 06/29/2017   ALT 11 07/11/2020   AST 15 07/11/2020   NA 137 07/11/2020   K 4.8 07/11/2020   CL 101 07/11/2020   CREATININE 0.70 07/11/2020   BUN 14 07/11/2020   CO2 29 07/11/2020   TSH 5.70 (H) 07/11/2020   HGBA1C 5.3 07/11/2020   Assessment/Plan:  [name] is a 65 y.o. White or Caucasian [1] female with  has a past medical history of ALLERGIC RHINITIS (11/02/2006), Allergy, Anemia (2022), Arthritis, Cervical disc disease (04/15/2011), Chronic maxillary sinusitis (10/05/2008), Chronic neck pain (04/15/2011), GERD (11/02/2006), HYPERLIPIDEMIA (01/10/2007), Hyperlipidemia (01/10/2007), HYPERTENSION (01/10/2007), HYPOTHYROIDISM (11/02/2006), NEPHROLITHIASIS, HX OF (01/10/2007), PMR (  polymyalgia rheumatica) ([place]) (11/10/2018), and SINUSITIS- ACUTE-NOS (04/03/2010).  Iron deficiency anemia No overt bleeding, finished 3 mo iron supplement, now for f/u labs including iron, ferritin, cbc  Hypothyroidism Lab Results  Component Value Date   TSH 5.70 (H) 07/11/2020   Mild  uncontrolled recent, pt to continue levothyroxine and f/u lab   Hyperlipidemia Lab Results  Component Value Date   LDLCALC 79 06/29/2017   Mild uncontrolled, pt to continue current low chol diet, declines statin fornow   Hyperglycemia Lab Results  Component Value Date   HGBA1C 5.3 07/11/2020   Stable, pt to continue current medical treatment  - diet   Essential hypertension BP Readings from Last 3 Encounters:  11/26/20 126/70  10/11/20 111/71  08/09/20 124/68   Stable, pt to continue medical treatment amlodipine, lisinopril   Skin lesion To post left shoulder, benign appearing but should follow for any changes, consider derm referral  Followup: No follow-ups on file.  Cathlean Cower, MD 11/26/2020 8:09 PM [facility]

## 2020-11-26 NOTE — Assessment & Plan Note (Signed)
To post left shoulder, benign appearing but should follow for any changes, consider derm referral

## 2020-11-28 ENCOUNTER — Other Ambulatory Visit: Payer: Self-pay | Admitting: Internal Medicine

## 2020-11-28 ENCOUNTER — Other Ambulatory Visit (INDEPENDENT_AMBULATORY_CARE_PROVIDER_SITE_OTHER): Payer: Medicare HMO

## 2020-11-28 ENCOUNTER — Encounter: Payer: Self-pay | Admitting: Internal Medicine

## 2020-11-28 DIAGNOSIS — E039 Hypothyroidism, unspecified: Secondary | ICD-10-CM | POA: Diagnosis not present

## 2020-11-28 DIAGNOSIS — D509 Iron deficiency anemia, unspecified: Secondary | ICD-10-CM

## 2020-11-28 DIAGNOSIS — E7800 Pure hypercholesterolemia, unspecified: Secondary | ICD-10-CM | POA: Diagnosis not present

## 2020-11-28 LAB — IBC PANEL
Iron: 63 ug/dL (ref 42–145)
Saturation Ratios: 11.1 % — ABNORMAL LOW (ref 20.0–50.0)
TIBC: 568.4 ug/dL — ABNORMAL HIGH (ref 250.0–450.0)
Transferrin: 406 mg/dL — ABNORMAL HIGH (ref 212.0–360.0)

## 2020-11-28 LAB — CBC WITH DIFFERENTIAL/PLATELET
Basophils Absolute: 0.1 10*3/uL (ref 0.0–0.1)
Basophils Relative: 0.9 % (ref 0.0–3.0)
Eosinophils Absolute: 0.2 10*3/uL (ref 0.0–0.7)
Eosinophils Relative: 2.3 % (ref 0.0–5.0)
HCT: 35.4 % — ABNORMAL LOW (ref 36.0–46.0)
Hemoglobin: 11.7 g/dL — ABNORMAL LOW (ref 12.0–15.0)
Lymphocytes Relative: 34.3 % (ref 12.0–46.0)
Lymphs Abs: 2.3 10*3/uL (ref 0.7–4.0)
MCHC: 32.9 g/dL (ref 30.0–36.0)
MCV: 82.1 fl (ref 78.0–100.0)
Monocytes Absolute: 0.5 10*3/uL (ref 0.1–1.0)
Monocytes Relative: 7.6 % (ref 3.0–12.0)
Neutro Abs: 3.7 10*3/uL (ref 1.4–7.7)
Neutrophils Relative %: 54.9 % (ref 43.0–77.0)
Platelets: 247 10*3/uL (ref 150.0–400.0)
RBC: 4.32 Mil/uL (ref 3.87–5.11)
RDW: 14.5 % (ref 11.5–15.5)
WBC: 6.8 10*3/uL (ref 4.0–10.5)

## 2020-11-28 LAB — HEPATIC FUNCTION PANEL
ALT: 10 U/L (ref 0–35)
AST: 15 U/L (ref 0–37)
Albumin: 4 g/dL (ref 3.5–5.2)
Alkaline Phosphatase: 110 U/L (ref 39–117)
Bilirubin, Direct: 0.1 mg/dL (ref 0.0–0.3)
Total Bilirubin: 0.3 mg/dL (ref 0.2–1.2)
Total Protein: 6.9 g/dL (ref 6.0–8.3)

## 2020-11-28 LAB — LIPID PANEL
Cholesterol: 198 mg/dL (ref 0–200)
HDL: 61.3 mg/dL (ref >39.00)
LDL Cholesterol: 102 mg/dL — ABNORMAL HIGH (ref 0–99)
NonHDL: 137.15
Total CHOL/HDL Ratio: 3
Triglycerides: 178 mg/dL — ABNORMAL HIGH (ref 0.0–149.0)
VLDL: 35.6 mg/dL (ref 0.0–40.0)

## 2020-11-28 LAB — TSH: TSH: 1.07 u[IU]/mL (ref 0.35–5.50)

## 2020-11-28 LAB — FERRITIN: Ferritin: 4.5 ng/mL — ABNORMAL LOW (ref 10.0–291.0)

## 2020-11-28 LAB — T4, FREE: Free T4: 0.82 ng/dL (ref 0.60–1.60)

## 2020-11-28 MED ORDER — POLYSACCHARIDE IRON COMPLEX 150 MG PO CAPS: 150.0000 mg | ORAL_CAPSULE | Freq: Every day | ORAL | 2 refills | Status: DC

## 2020-11-28 NOTE — Addendum Note (Signed)
Addended by: Octavio Manns E on: 11/28/2020 08:06 AM   Modules accepted: Orders

## 2021-01-23 ENCOUNTER — Telehealth: Payer: Self-pay | Admitting: Internal Medicine

## 2021-01-23 MED ORDER — ZOLPIDEM TARTRATE 10 MG PO TABS: ORAL_TABLET | ORAL | 1 refills | Status: DC

## 2021-01-23 NOTE — Telephone Encounter (Signed)
1.Medication Requested: zolpidem (AMBIEN) 10 MG tablet 2. Pharmacy (Name, [facility], [facility]): [place], [place] Phone:  [phone]  Fax:  [phone]     3. On Med List: Y  4. Last Visit with PCP: 11/26/2020  5. Next visit date with PCP: 07/17/2021   Agent: Please be advised that RX refills may take up to 3 business days. We ask that you follow-up with your pharmacy.

## 2021-04-01 DIAGNOSIS — H5213 Myopia, bilateral: Secondary | ICD-10-CM | POA: Diagnosis not present

## 2021-05-20 ENCOUNTER — Other Ambulatory Visit: Payer: Self-pay | Admitting: Internal Medicine

## 2021-05-20 NOTE — Telephone Encounter (Signed)
Please refill as per office routine med refill policy (all routine meds to be refilled for 3 mo or monthly (per pt preference) up to one year from last visit, then month to month grace period for 3 mo, then further med refills will have to be denied) ? ?

## 2021-06-03 ENCOUNTER — Encounter: Payer: Self-pay | Admitting: Internal Medicine

## 2021-06-03 ENCOUNTER — Other Ambulatory Visit: Payer: Self-pay

## 2021-06-03 ENCOUNTER — Ambulatory Visit (INDEPENDENT_AMBULATORY_CARE_PROVIDER_SITE_OTHER): Payer: Medicare HMO | Admitting: Internal Medicine

## 2021-06-03 VITALS — BP 130/62 | HR 94 | Temp 98.7°F | Ht 66.5 in | Wt 190.5 lb

## 2021-06-03 DIAGNOSIS — D509 Iron deficiency anemia, unspecified: Secondary | ICD-10-CM | POA: Diagnosis not present

## 2021-06-03 DIAGNOSIS — I10 Essential (primary) hypertension: Secondary | ICD-10-CM

## 2021-06-03 DIAGNOSIS — R1032 Left lower quadrant pain: Secondary | ICD-10-CM | POA: Insufficient documentation

## 2021-06-03 LAB — BASIC METABOLIC PANEL
BUN: 17 mg/dL (ref 6–23)
CO2: 27 mEq/L (ref 19–32)
Calcium: 9 mg/dL (ref 8.4–10.5)
Chloride: 102 mEq/L (ref 96–112)
Creatinine, Ser: 0.77 mg/dL (ref 0.40–1.20)
GFR: 80.7 mL/min (ref >60.00)
Glucose, Bld: 90 mg/dL (ref 70–99)
Potassium: 3.3 mEq/L — ABNORMAL LOW (ref 3.5–5.1)
Sodium: 136 mEq/L (ref 135–145)

## 2021-06-03 LAB — IBC PANEL
Iron: 40 ug/dL — ABNORMAL LOW (ref 42–145)
Saturation Ratios: 9.5 % — ABNORMAL LOW (ref 20.0–50.0)
TIBC: 420 ug/dL (ref 250.0–450.0)
Transferrin: 300 mg/dL (ref 212.0–360.0)

## 2021-06-03 LAB — HEPATIC FUNCTION PANEL
ALT: 15 U/L (ref 0–35)
AST: 17 U/L (ref 0–37)
Albumin: 4.3 g/dL (ref 3.5–5.2)
Alkaline Phosphatase: 109 U/L (ref 39–117)
Bilirubin, Direct: 0 mg/dL (ref 0.0–0.3)
Total Bilirubin: 0.3 mg/dL (ref 0.2–1.2)
Total Protein: 7.1 g/dL (ref 6.0–8.3)

## 2021-06-03 LAB — LIPASE: Lipase: 18 U/L (ref 11.0–59.0)

## 2021-06-03 LAB — CBC WITH DIFFERENTIAL/PLATELET
Basophils Absolute: 0.1 10*3/uL (ref 0.0–0.1)
Basophils Relative: 0.7 % (ref 0.0–3.0)
Eosinophils Absolute: 0.2 10*3/uL (ref 0.0–0.7)
Eosinophils Relative: 2 % (ref 0.0–5.0)
HCT: 37.2 % (ref 36.0–46.0)
Hemoglobin: 12.4 g/dL (ref 12.0–15.0)
Lymphocytes Relative: 34.2 % (ref 12.0–46.0)
Lymphs Abs: 3 10*3/uL (ref 0.7–4.0)
MCHC: 33.4 g/dL (ref 30.0–36.0)
MCV: 88.2 fl (ref 78.0–100.0)
Monocytes Absolute: 1 10*3/uL (ref 0.1–1.0)
Monocytes Relative: 11.6 % (ref 3.0–12.0)
Neutro Abs: 4.4 10*3/uL (ref 1.4–7.7)
Neutrophils Relative %: 51.5 % (ref 43.0–77.0)
Platelets: 251 10*3/uL (ref 150.0–400.0)
RBC: 4.22 Mil/uL (ref 3.87–5.11)
RDW: 13.4 % (ref 11.5–15.5)
WBC: 8.6 10*3/uL (ref 4.0–10.5)

## 2021-06-03 LAB — FERRITIN: Ferritin: 29.2 ng/mL (ref 10.0–291.0)

## 2021-06-03 MED ORDER — METRONIDAZOLE 250 MG PO TABS: 250.0000 mg | ORAL_TABLET | Freq: Three times a day (TID) | ORAL | 0 refills | Status: DC

## 2021-06-03 MED ORDER — CIPROFLOXACIN HCL 500 MG PO TABS: 500.0000 mg | ORAL_TABLET | Freq: Two times a day (BID) | ORAL | 0 refills | Status: AC

## 2021-06-03 NOTE — Assessment & Plan Note (Signed)
Aug 2022 ferritin low -   Uncontrolled, for f/u lab today

## 2021-06-03 NOTE — Patient Instructions (Addendum)
Please take all new medication as prescribed - the antibiotics  Please continue all other medications as before, and refills have been done if requested.  Please have the pharmacy call with any other refills you may need.  Please continue your efforts at being more active, low cholesterol diet, and weight control.  Please keep your appointments with your specialists as you may have planned  Please go to the LAB at the blood drawing area for the tests to be done  You will be contacted by phone if any changes need to be made immediately.  Otherwise, you will receive a letter about your results with an explanation, but please check with MyChart first.  Please remember to sign up for MyChart if you have not done so, as this will be important to you in the future with finding out test results, communicating by private email, and scheduling acute appointments online when needed.

## 2021-06-03 NOTE — Assessment & Plan Note (Signed)
Differential includes UTI lower and possibly uppper on the left, or colitis, declines CT for now for empiric cipro/flagyl and labs including cbc, consider GI referral

## 2021-06-03 NOTE — Assessment & Plan Note (Signed)
BP Readings from Last 3 Encounters:  06/03/21 130/62  11/26/20 126/70  10/11/20 111/71   Stable, pt to continue medical treatment norvasc, lisinopril

## 2021-06-03 NOTE — Progress Notes (Signed)
Patient ID: [name], female   DOB: [birth year], 66 y.o.   MRN: [id number]        Chief Complaint: follow up left flank and LLQ pain       HPI:  [name] is a 66 y.o. female here with c/o 8 days gradually wrosening now moderate at least nausea, vomiting, feverish, left flank, left side and LLQ pain and soreness to touch or lie on the side, as well as diarrhea, all without chills and o/w appears non toxic today.  Denies worsening reflux, dysphagia, other bowel change or blood.  Pt asks for f/u lab r/o low iron as of last visit.  Pt denies chest pain, increased sob or doe, wheezing, orthopnea, PND, increased LE swelling, palpitations, dizziness or syncope.   Pt denies polydipsia, polyuria, or new focal neuro s/s.        Wt Readings from Last 3 Encounters:  06/03/21 190 lb 8 oz (86.4 kg)  11/26/20 195 lb (88.5 kg)  10/11/20 193 lb (87.5 kg)   BP Readings from Last 3 Encounters:  06/03/21 130/62  11/26/20 126/70  10/11/20 111/71         Past Medical History:  Diagnosis Date   ALLERGIC RHINITIS 11/02/2006   Allergy    Anemia 2022   been taking Iron   Arthritis    Cervical disc disease 04/15/2011   Chronic maxillary sinusitis 10/05/2008   Chronic neck pain 04/15/2011   GERD 11/02/2006   HYPERLIPIDEMIA 01/10/2007   Hyperlipidemia 01/10/2007   Qualifier: Diagnosis of  By: Jenny Reichmann MD, Hunt Oris    HYPERTENSION 01/10/2007   HYPOTHYROIDISM 11/02/2006   NEPHROLITHIASIS, HX OF 01/10/2007   PMR (polymyalgia rheumatica) ([place]) 11/10/2018   SINUSITIS- ACUTE-NOS 04/03/2010   Past Surgical History:  Procedure Laterality Date   ABDOMINAL HYSTERECTOMY  04/06/1994   COLONOSCOPY     PARTIAL HYSTERECTOMY     has ovaries   s/p selective nerve root block 2002     per Dr. Louanne Skye to right neck   TONSILLECTOMY  04/07/1959   TONSILLECTOMY     age 62   TUBAL LIGATION      reports that she has never smoked. She has never used smokeless tobacco. She reports that she does not drink alcohol and does  not use drugs. family history includes Colon polyps in her father; Diabetes in an other family member; Hypertension in an other family member; Hypothyroidism in her mother. No Known Allergies Current Outpatient Medications on File Prior to Visit  Medication Sig Dispense Refill   ALPRAZolam (XANAX) 0.5 MG tablet   2   amLODipine (NORVASC) 10 MG tablet TAKE 1 TABLET(10 MG) BY MOUTH DAILY. 90 tablet 3   aspirin EC 81 MG tablet Take 1 tablet (81 mg total) by mouth daily. 90 tablet 11   citalopram (CELEXA) 10 MG tablet TAKE 1 TABLET(10 MG) BY MOUTH DAILY 90 tablet 3   estradiol (ESTRACE) 2 MG tablet Take 2 mg by mouth daily.     gabapentin (NEURONTIN) 300 MG capsule Take 1 capsule (300 mg total) by mouth 3 (three) times daily as needed (nerve pain). 180 capsule 3   iron polysaccharides (NU-IRON) 150 MG capsule Take 1 capsule (150 mg total) by mouth daily. 90 capsule 2   levothyroxine (SYNTHROID) 125 MCG tablet TAKE 1 TABLET(125 MCG) BY MOUTH DAILY 90 tablet 3   lisinopril (ZESTRIL) 40 MG tablet TAKE 1 TABLET BY MOUTH EVERY DAY 90 tablet 3   zolpidem (AMBIEN) 10 MG tablet  1 tab by mouth at bedtime as needed 90 tablet 1   No current facility-administered medications on file prior to visit.        ROS:  All others reviewed and negative.  Objective        PE:  BP 130/62    Pulse 94    Temp 98.7 F (37.1 C) (Oral)    Ht 5' 6.5" (1.689 m)    Wt 190 lb 8 oz (86.4 kg)    SpO2 95%    BMI 30.29 kg/m                 Constitutional: Pt appears in NAD               HENT: Head: NCAT.                Right Ear: External ear normal.                 Left Ear: External ear normal.                Eyes: . Pupils are equal, round, and reactive to light. Conjunctivae and EOM are normal               Nose: without d/c or deformity               Neck: Neck supple. Gross normal ROM               Cardiovascular: Normal rate and regular rhythm.                 Pulmonary/Chest: Effort normal and breath sounds  without rales or wheezing.                Abd:  Soft, ND, + BS, no organomegaly, tender mild ot mod LUQ and LLQ, side and left flank and suprapubic area, no guarding or rebound               Neurological: Pt is alert. At baseline orientation, motor grossly intact               Skin: Skin is warm. No rashes, no other new lesions, LE edema - none               Psychiatric: Pt behavior is normal without agitation   Micro: none  Cardiac tracings I have personally interpreted today:  none  Pertinent Radiological findings (summarize): none   Lab Results  Component Value Date   WBC 8.6 06/03/2021   HGB 12.4 06/03/2021   HCT 37.2 06/03/2021   PLT 251.0 06/03/2021   GLUCOSE 90 06/03/2021   CHOL 198 11/28/2020   TRIG 178.0 (H) 11/28/2020   HDL 61.30 11/28/2020   LDLDIRECT 109.0 07/11/2020   LDLCALC 102 (H) 11/28/2020   ALT 15 06/03/2021   AST 17 06/03/2021   NA 136 06/03/2021   K 3.3 (L) 06/03/2021   CL 102 06/03/2021   CREATININE 0.77 06/03/2021   BUN 17 06/03/2021   CO2 27 06/03/2021   TSH 1.07 11/28/2020   HGBA1C 5.3 07/11/2020   Assessment/Plan:  [name] is a 66 y.o. White or Caucasian [1] female with  has a past medical history of ALLERGIC RHINITIS (11/02/2006), Allergy, Anemia (2022), Arthritis, Cervical disc disease (04/15/2011), Chronic maxillary sinusitis (10/05/2008), Chronic neck pain (04/15/2011), GERD (11/02/2006), HYPERLIPIDEMIA (01/10/2007), Hyperlipidemia (01/10/2007), HYPERTENSION (01/10/2007), HYPOTHYROIDISM (11/02/2006), NEPHROLITHIASIS, HX OF (01/10/2007), PMR (polymyalgia rheumatica) ([place]) (11/10/2018), and SINUSITIS- ACUTE-NOS (04/03/2010).  Iron  deficiency anemia Aug 2022 ferritin low -   Uncontrolled, for f/u lab today  LLQ pain Differential includes UTI lower and possibly uppper on the left, or colitis, declines CT for now for empiric cipro/flagyl and labs including cbc, consider GI referral  Essential hypertension BP Readings from Last 3  Encounters:  06/03/21 130/62  11/26/20 126/70  10/11/20 111/71   Stable, pt to continue medical treatment norvasc, lisinopril  Followup: Return if symptoms worsen or fail to improve.  Cathlean Cower, MD 06/03/2021 8:33 PM [facility]

## 2021-06-04 LAB — URINALYSIS, ROUTINE W REFLEX MICROSCOPIC
Bilirubin Urine: NEGATIVE
Hgb urine dipstick: NEGATIVE
Ketones, ur: NEGATIVE
Leukocytes,Ua: NEGATIVE
Nitrite: NEGATIVE
RBC / HPF: NONE SEEN (ref 0–?)
Specific Gravity, Urine: 1.025 (ref 1.000–1.030)
Total Protein, Urine: NEGATIVE
Urine Glucose: NEGATIVE
Urobilinogen, UA: 0.2 (ref 0.0–1.0)
pH: 6 (ref 5.0–8.0)

## 2021-06-04 LAB — URINE CULTURE

## 2021-06-29 ENCOUNTER — Other Ambulatory Visit: Payer: Self-pay | Admitting: Internal Medicine

## 2021-06-29 NOTE — Telephone Encounter (Signed)
Please refill as per office routine med refill policy (all routine meds to be refilled for 3 mo or monthly (per pt preference) up to one year from last visit, then month to month grace period for 3 mo, then further med refills will have to be denied) ? ?

## 2021-07-17 ENCOUNTER — Ambulatory Visit (INDEPENDENT_AMBULATORY_CARE_PROVIDER_SITE_OTHER): Payer: Medicare HMO | Admitting: Internal Medicine

## 2021-07-17 ENCOUNTER — Encounter: Payer: Self-pay | Admitting: Internal Medicine

## 2021-07-17 VITALS — BP 112/60 | HR 68 | Temp 98.0°F | Ht 66.5 in | Wt 192.0 lb

## 2021-07-17 DIAGNOSIS — E538 Deficiency of other specified B group vitamins: Secondary | ICD-10-CM

## 2021-07-17 DIAGNOSIS — Z0001 Encounter for general adult medical examination with abnormal findings: Secondary | ICD-10-CM

## 2021-07-17 DIAGNOSIS — E7800 Pure hypercholesterolemia, unspecified: Secondary | ICD-10-CM | POA: Diagnosis not present

## 2021-07-17 DIAGNOSIS — I10 Essential (primary) hypertension: Secondary | ICD-10-CM

## 2021-07-17 DIAGNOSIS — M5416 Radiculopathy, lumbar region: Secondary | ICD-10-CM

## 2021-07-17 DIAGNOSIS — E039 Hypothyroidism, unspecified: Secondary | ICD-10-CM

## 2021-07-17 DIAGNOSIS — Z23 Encounter for immunization: Secondary | ICD-10-CM | POA: Diagnosis not present

## 2021-07-17 DIAGNOSIS — M25551 Pain in right hip: Secondary | ICD-10-CM

## 2021-07-17 DIAGNOSIS — M542 Cervicalgia: Secondary | ICD-10-CM | POA: Diagnosis not present

## 2021-07-17 DIAGNOSIS — D509 Iron deficiency anemia, unspecified: Secondary | ICD-10-CM

## 2021-07-17 DIAGNOSIS — G8929 Other chronic pain: Secondary | ICD-10-CM

## 2021-07-17 DIAGNOSIS — R739 Hyperglycemia, unspecified: Secondary | ICD-10-CM | POA: Diagnosis not present

## 2021-07-17 DIAGNOSIS — E559 Vitamin D deficiency, unspecified: Secondary | ICD-10-CM

## 2021-07-17 LAB — CBC WITH DIFFERENTIAL/PLATELET
Basophils Absolute: 0.1 10*3/uL (ref 0.0–0.1)
Basophils Relative: 0.8 % (ref 0.0–3.0)
Eosinophils Absolute: 0.2 10*3/uL (ref 0.0–0.7)
Eosinophils Relative: 2.5 % (ref 0.0–5.0)
HCT: 39.3 % (ref 36.0–46.0)
Hemoglobin: 13.1 g/dL (ref 12.0–15.0)
Lymphocytes Relative: 36.2 % (ref 12.0–46.0)
Lymphs Abs: 2.4 10*3/uL (ref 0.7–4.0)
MCHC: 33.4 g/dL (ref 30.0–36.0)
MCV: 89.5 fl (ref 78.0–100.0)
Monocytes Absolute: 0.5 10*3/uL (ref 0.1–1.0)
Monocytes Relative: 8.2 % (ref 3.0–12.0)
Neutro Abs: 3.4 10*3/uL (ref 1.4–7.7)
Neutrophils Relative %: 52.3 % (ref 43.0–77.0)
Platelets: 230 10*3/uL (ref 150.0–400.0)
RBC: 4.38 Mil/uL (ref 3.87–5.11)
RDW: 13.6 % (ref 11.5–15.5)
WBC: 6.5 10*3/uL (ref 4.0–10.5)

## 2021-07-17 LAB — BASIC METABOLIC PANEL
BUN: 11 mg/dL (ref 6–23)
CO2: 29 mEq/L (ref 19–32)
Calcium: 9.3 mg/dL (ref 8.4–10.5)
Chloride: 102 mEq/L (ref 96–112)
Creatinine, Ser: 0.73 mg/dL (ref 0.40–1.20)
GFR: 85.97 mL/min (ref >60.00)
Glucose, Bld: 88 mg/dL (ref 70–99)
Potassium: 4.6 mEq/L (ref 3.5–5.1)
Sodium: 137 mEq/L (ref 135–145)

## 2021-07-17 LAB — URINALYSIS, ROUTINE W REFLEX MICROSCOPIC
Bilirubin Urine: NEGATIVE
Hgb urine dipstick: NEGATIVE
Ketones, ur: NEGATIVE
Leukocytes,Ua: NEGATIVE
Nitrite: NEGATIVE
RBC / HPF: NONE SEEN (ref 0–?)
Specific Gravity, Urine: 1.015 (ref 1.000–1.030)
Total Protein, Urine: NEGATIVE
Urine Glucose: NEGATIVE
Urobilinogen, UA: 0.2 (ref 0.0–1.0)
pH: 7 (ref 5.0–8.0)

## 2021-07-17 LAB — VITAMIN B12: Vitamin B-12: 667 pg/mL (ref 211–911)

## 2021-07-17 LAB — VITAMIN D 25 HYDROXY (VIT D DEFICIENCY, FRACTURES): VITD: 51.99 ng/mL (ref 30.00–100.00)

## 2021-07-17 LAB — LIPID PANEL
Cholesterol: 190 mg/dL (ref 0–200)
HDL: 67.2 mg/dL (ref >39.00)
LDL Cholesterol: 93 mg/dL (ref 0–99)
NonHDL: 123.28
Total CHOL/HDL Ratio: 3
Triglycerides: 152 mg/dL — ABNORMAL HIGH (ref 0.0–149.0)
VLDL: 30.4 mg/dL (ref 0.0–40.0)

## 2021-07-17 LAB — HEPATIC FUNCTION PANEL
ALT: 13 U/L (ref 0–35)
AST: 16 U/L (ref 0–37)
Albumin: 4.3 g/dL (ref 3.5–5.2)
Alkaline Phosphatase: 100 U/L (ref 39–117)
Bilirubin, Direct: 0.1 mg/dL (ref 0.0–0.3)
Total Bilirubin: 0.4 mg/dL (ref 0.2–1.2)
Total Protein: 6.9 g/dL (ref 6.0–8.3)

## 2021-07-17 LAB — TSH: TSH: 1.71 u[IU]/mL (ref 0.35–5.50)

## 2021-07-17 LAB — IBC PANEL
Iron: 114 ug/dL (ref 42–145)
Saturation Ratios: 23.5 % (ref 20.0–50.0)
TIBC: 484.4 ug/dL — ABNORMAL HIGH (ref 250.0–450.0)
Transferrin: 346 mg/dL (ref 212.0–360.0)

## 2021-07-17 LAB — FERRITIN: Ferritin: 17.5 ng/mL (ref 10.0–291.0)

## 2021-07-17 LAB — T4, FREE: Free T4: 0.92 ng/dL (ref 0.60–1.60)

## 2021-07-17 LAB — HEMOGLOBIN A1C: Hgb A1c MFr Bld: 5.2 % (ref 4.6–6.5)

## 2021-07-17 MED ORDER — AMLODIPINE BESYLATE 10 MG PO TABS: ORAL_TABLET | ORAL | 3 refills | Status: DC

## 2021-07-17 MED ORDER — ZOLPIDEM TARTRATE 10 MG PO TABS: ORAL_TABLET | ORAL | 1 refills | Status: DC

## 2021-07-17 MED ORDER — LEVOTHYROXINE SODIUM 125 MCG PO TABS: ORAL_TABLET | ORAL | 3 refills | Status: DC

## 2021-07-17 MED ORDER — LISINOPRIL 40 MG PO TABS: 40.0000 mg | ORAL_TABLET | Freq: Every day | ORAL | 3 refills | Status: DC

## 2021-07-17 MED ORDER — GABAPENTIN 300 MG PO CAPS: 300.0000 mg | ORAL_CAPSULE | Freq: Three times a day (TID) | ORAL | 3 refills | Status: DC | PRN

## 2021-07-17 MED ORDER — CITALOPRAM HYDROBROMIDE 10 MG PO TABS: ORAL_TABLET | ORAL | 3 refills | Status: DC

## 2021-07-17 NOTE — Patient Instructions (Addendum)
Please consider having the shingles shot done at your local pharmacy ? ?You had the Prevnar 20 pneumonia shot today ? ?Please continue all other medications as before, and refills have been done if requested. ? ?Please have the pharmacy call with any other refills you may need. ? ?Please continue your efforts at being more active, low cholesterol diet, and weight control. ? ?You are otherwise up to date with prevention measures today. ? ?Please keep your appointments with your specialists as you may have planned ? ?Please go to the LAB at the blood drawing area for the tests to be done ? ?You will be contacted by phone if any changes need to be made immediately.  Otherwise, you will receive a letter about your results with an explanation, but please check with MyChart first. ? ?Please remember to sign up for MyChart if you have not done so, as this will be important to you in the future with finding out test results, communicating by private email, and scheduling acute appointments online when needed. ? ?Please make an Appointment to return for your 1 year visit, or sooner if needed, with Lab testing by Appointment as well, to be done about 3-5 days before at the [place] (so this is for TWO appointments - please see the scheduling desk as you leave) ? ? ?Due to the ongoing Covid 19 pandemic, our lab now requires an appointment for any labs done at our office.  If you need labs done and do not have an appointment, please call our office ahead of time to schedule before presenting to the lab for your testing. ? ?

## 2021-07-17 NOTE — Progress Notes (Signed)
Patient ID: [name], female   DOB: 1956-03-10, 66 y.o.   MRN: [id number] ? ? ? ?     Chief Complaint:: wellness exam and iron deficiency, neck pain, low vit d and b12, low thyroid, hld, hyperglycemia, htn ? ?     HPI:  RAI SINAGRA is a 66 y.o. female here for wellness exam; declines shingrix, mammogram, dxa for now, plans to see gyn soon, for prevnar 20 today, o/w up to date ?         ?              Also has seen ortho in past for neck pain, but also in last 2 wks with increased pop and crakcles and pain lasting several hours, then also some numbness to the left face (but not neck otherwise) such as getting the teeth numbed up and getting cheek numbness as well.  No recent overt bleeding, Pt denies chest pain, increased sob or doe, wheezing, orthopnea, PND, increased LE swelling, palpitations, dizziness or syncope.   Pt denies polydipsia, polyuria, or new focal neuro s/s.    Pt denies fever, wt loss, night sweats, loss of appetite, or other constitutional symptoms   ?  ?Wt Readings from Last 3 Encounters:  ?07/17/21 192 lb (87.1 kg)  ?06/03/21 190 lb 8 oz (86.4 kg)  ?11/26/20 195 lb (88.5 kg)  ? ?BP Readings from Last 3 Encounters:  ?07/17/21 112/60  ?06/03/21 130/62  ?11/26/20 126/70  ? ?Immunization History  ?Administered Date(s) Administered  ? Influenza Inj Mdck Quad Pf 01/23/2018  ? Influenza Whole 01/23/2008, 01/21/2009  ? Influenza,inj,Quad PF,6+ Mos 02/01/2017, 01/16/2018  ? Influenza-Unspecified 02/05/2015, 01/16/2018  ? PFIZER(Purple Top)SARS-COV-2 Vaccination 06/28/2019, 07/19/2019  ? PNEUMOCOCCAL CONJUGATE-20 07/17/2021  ? Td 02/06/2009  ? Tdap 07/11/2020  ? ?There are no preventive care reminders to display for this patient. ? ?  ? ?Past Medical History:  ?Diagnosis Date  ? ALLERGIC RHINITIS 11/02/2006  ? Allergy   ? Anemia 2022  ? been taking Iron  ? Arthritis   ? Cervical disc disease 04/15/2011  ? Chronic maxillary sinusitis 10/05/2008  ? Chronic neck pain 04/15/2011  ? GERD 11/02/2006  ?  HYPERLIPIDEMIA 01/10/2007  ? Hyperlipidemia 01/10/2007  ? Qualifier: Diagnosis of  By: Jenny Reichmann MD, Hunt Oris   ? HYPERTENSION 01/10/2007  ? HYPOTHYROIDISM 11/02/2006  ? NEPHROLITHIASIS, HX OF 01/10/2007  ? PMR (polymyalgia rheumatica) ([place]) 11/10/2018  ? SINUSITIS- ACUTE-NOS 04/03/2010  ? ?Past Surgical History:  ?Procedure Laterality Date  ? ABDOMINAL HYSTERECTOMY  04/06/1994  ? COLONOSCOPY    ? PARTIAL HYSTERECTOMY    ? has ovaries  ? s/p selective nerve root block 2002    ? per Dr. Louanne Skye to right neck  ? TONSILLECTOMY  04/07/1959  ? TONSILLECTOMY    ? age 71  ? TUBAL LIGATION    ? ? reports that she has never smoked. She has never used smokeless tobacco. She reports that she does not drink alcohol and does not use drugs. ?family history includes Colon polyps in her father; Diabetes in an other family member; Hypertension in an other family member; Hypothyroidism in her mother. ?No Known Allergies ?Current Outpatient Medications on File Prior to Visit  ?Medication Sig Dispense Refill  ? ALPRAZolam (XANAX) 0.5 MG tablet   2  ? aspirin EC 81 MG tablet Take 1 tablet (81 mg total) by mouth daily. 90 tablet 11  ? estradiol (ESTRACE) 2 MG tablet Take 2 mg by mouth daily.    ?  iron polysaccharides (NU-IRON) 150 MG capsule Take 1 capsule (150 mg total) by mouth daily. 90 capsule 2  ? ?No current facility-administered medications on file prior to visit.  ? ?     ROS:  All others reviewed and negative. ? ?Objective  ? ?     PE:  BP 112/60 (BP Location: Left Arm, Patient Position: Sitting, Cuff Size: Large)   Pulse 68   Temp 98 ?F (36.7 ?C) (Oral)   Ht 5' 6.5" (1.689 m)   Wt 192 lb (87.1 kg)   SpO2 100%   BMI 30.53 kg/m?  ? ?              Constitutional: Pt appears in NAD ?              HENT: Head: NCAT.  ?              Right Ear: External ear normal.   ?              Left Ear: External ear normal.  ?              Eyes: . Pupils are equal, round, and reactive to light. Conjunctivae and EOM are normal ?              Nose:  without d/c or deformity ?              Neck: Neck supple. Gross normal ROM ?              Cardiovascular: Normal rate and regular rhythm.   ?              Pulmonary/Chest: Effort normal and breath sounds without rales or wheezing.  ?              Abd:  Soft, NT, ND, + BS, no organomegaly ?              Neurological: Pt is alert. At baseline orientation, motor grossly intact ?              Skin: Skin is warm. No rashes, no other new lesions, LE edema - none ?              Psychiatric: Pt behavior is normal without agitation  ? ?Micro: none ? ?Cardiac tracings I have personally interpreted today:  none ? ?Pertinent Radiological findings (summarize): none  ? ?Lab Results  ?Component Value Date  ? WBC 6.5 07/17/2021  ? HGB 13.1 07/17/2021  ? HCT 39.3 07/17/2021  ? PLT 230.0 07/17/2021  ? GLUCOSE 88 07/17/2021  ? CHOL 190 07/17/2021  ? TRIG 152.0 (H) 07/17/2021  ? HDL 67.20 07/17/2021  ? LDLDIRECT 109.0 07/11/2020  ? [place] 93 07/17/2021  ? ALT 13 07/17/2021  ? AST 16 07/17/2021  ? NA 137 07/17/2021  ? K 4.6 07/17/2021  ? CL 102 07/17/2021  ? CREATININE 0.73 07/17/2021  ? BUN 11 07/17/2021  ? CO2 29 07/17/2021  ? TSH 1.71 07/17/2021  ? HGBA1C 5.2 07/17/2021  ? ?Assessment/Plan:  ?[name] is a 66 y.o. White or Caucasian [1] female with  has a past medical history of ALLERGIC RHINITIS (11/02/2006), Allergy, Anemia (2022), Arthritis, Cervical disc disease (04/15/2011), Chronic maxillary sinusitis (10/05/2008), Chronic neck pain (04/15/2011), GERD (11/02/2006), HYPERLIPIDEMIA (01/10/2007), Hyperlipidemia (01/10/2007), HYPERTENSION (01/10/2007), HYPOTHYROIDISM (11/02/2006), NEPHROLITHIASIS, HX OF (01/10/2007), PMR (polymyalgia rheumatica) ([place]) (11/10/2018), and SINUSITIS- ACUTE-NOS (04/03/2010). ? ?Vitamin D deficiency ?Last vitamin D ?Lab Results  ?Component Value  Date  ? VD25OH 51.99 07/17/2021  ? ?Stable, cont oral replacement ? ?Vitamin B12 deficiency ?Lab Results  ?Component Value Date  ? [id number] 667  07/17/2021  ? ?Stable, cont oral replacement - b12 1000 mcg qd ? ? ?Encounter for well adult exam with abnormal findings ?Age and sex appropriate education and counseling updated with regular exercise and diet ?Referrals for preventative services - declines mammogram, dxa ?Immunizations addressed - declines shingrix, for prevnar 20 ?Smoking counseling  - none needed ?Evidence for depression or other mood disorder - none significant ?Most recent labs reviewed. ?I have personally reviewed and have noted: ?1) the patient's medical and social history ?2) The patient's current medications and supplements ?3) The patient's height, weight, and BMI have been recorded in the chart ? ? ?Iron deficiency anemia ?Also for f/u iron lab ? ?Hypothyroidism ?Lab Results  ?Component Value Date  ? TSH 1.71 07/17/2021  ? ?Stable, pt to continue levothyroxine ? ?Hyperlipidemia ?Lab Results  ?Component Value Date  ? [place] 93 07/17/2021  ? ?Stable, pt to continue current low chol diet, declines statin ? ? ?Hyperglycemia ?Lab Results  ?Component Value Date  ? HGBA1C 5.2 07/17/2021  ? ?Stable, pt to continue current medical treatment  - diet ? ? ?Essential hypertension ?BP Readings from Last 3 Encounters:  ?07/17/21 112/60  ?06/03/21 130/62  ?11/26/20 126/70  ? ?Stable, pt to continue medical treatment norvasc, lisinopril ? ? ?Chronic neck pain ?Recent mld acute worsening With likely mild neuritic pain left sided and numbness - for gabapentin asd,  to f/u any worsening symptoms or concerns ? ?Followup: Return in about 1 year (around 07/18/2022). ? ?Cathlean Cower, MD 07/24/2021 9:37 PM ?[place] ?

## 2021-07-24 ENCOUNTER — Encounter: Payer: Self-pay | Admitting: Internal Medicine

## 2021-07-24 NOTE — Assessment & Plan Note (Addendum)
Recent mld acute worsening With likely mild neuritic pain left sided and numbness - for gabapentin asd,  to f/u any worsening symptoms or concerns ?

## 2021-07-24 NOTE — Assessment & Plan Note (Signed)
Lab Results  ?Component Value Date  ? HGBA1C 5.2 07/17/2021  ? ?Stable, pt to continue current medical treatment  - diet ? ?

## 2021-07-24 NOTE — Assessment & Plan Note (Signed)
Last vitamin D Lab Results  Component Value Date   VD25OH 51.99 07/17/2021   Stable, cont oral replacement  

## 2021-07-24 NOTE — Assessment & Plan Note (Signed)
Lab Results  ?Component Value Date  ? [id number] 667 07/17/2021  ? ?Stable, cont oral replacement - b12 1000 mcg qd ? ?

## 2021-07-24 NOTE — Assessment & Plan Note (Signed)
Lab Results  ?Component Value Date  ? TSH 1.71 07/17/2021  ? ?Stable, pt to continue levothyroxine ?

## 2021-07-24 NOTE — Assessment & Plan Note (Signed)
Also for f/u iron lab ?

## 2021-07-24 NOTE — Assessment & Plan Note (Signed)
Lab Results  ?Component Value Date  ? [place] 93 07/17/2021  ? ?Stable, pt to continue current low chol diet, declines statin ? ?

## 2021-07-24 NOTE — Assessment & Plan Note (Addendum)
Age and sex appropriate education and counseling updated with regular exercise and diet ?Referrals for preventative services - declines mammogram, dxa ?Immunizations addressed - declines shingrix, for prevnar 20 ?Smoking counseling  - none needed ?Evidence for depression or other mood disorder - none significant ?Most recent labs reviewed. ?I have personally reviewed and have noted: ?1) the patient's medical and social history ?2) The patient's current medications and supplements ?3) The patient's height, weight, and BMI have been recorded in the chart ? ?

## 2021-07-24 NOTE — Assessment & Plan Note (Signed)
BP Readings from Last 3 Encounters:  ?07/17/21 112/60  ?06/03/21 130/62  ?11/26/20 126/70  ? ?Stable, pt to continue medical treatment norvasc, lisinopril ? ?

## 2021-08-03 ENCOUNTER — Other Ambulatory Visit: Payer: Self-pay | Admitting: Internal Medicine

## 2021-08-04 ENCOUNTER — Other Ambulatory Visit: Payer: Self-pay | Admitting: Internal Medicine

## 2021-08-04 NOTE — Telephone Encounter (Signed)
Please refill as per office routine med refill policy (all routine meds to be refilled for 3 mo or monthly (per pt preference) up to one year from last visit, then month to month grace period for 3 mo, then further med refills will have to be denied) ? ?

## 2021-10-21 ENCOUNTER — Ambulatory Visit (INDEPENDENT_AMBULATORY_CARE_PROVIDER_SITE_OTHER): Payer: Medicare HMO

## 2021-10-21 ENCOUNTER — Other Ambulatory Visit: Payer: Self-pay

## 2021-10-21 DIAGNOSIS — Z0000 Encounter for general adult medical examination without abnormal findings: Secondary | ICD-10-CM

## 2021-10-21 MED ORDER — POLYSACCHARIDE IRON COMPLEX 150 MG PO CAPS: 150.0000 mg | ORAL_CAPSULE | Freq: Every day | ORAL | 1 refills | Status: DC

## 2021-10-21 NOTE — Patient Instructions (Signed)
Ms. Biggers , Thank you for taking time to come for your Medicare Wellness Visit. I appreciate your ongoing commitment to your health goals. Please review the following plan we discussed and let me know if I can assist you in the future.   Screening recommendations/referrals: Colonoscopy: Last done 10/11/2020; due every 10 years Mammogram: Last done 10/24/2020; due every year at [facility] Bone Density: No record per [facility] Recommended yearly ophthalmology/optometry visit for glaucoma screening and checkup Recommended yearly dental visit for hygiene and checkup  Vaccinations: Influenza vaccine: 01/2021; due every Fall Season Pneumococcal vaccine: 07/17/2021 (Prevnar 20) Tdap vaccine: 07/11/2020; due every 10 years Shingles vaccine: never done; patient will check with local pharmacy for vaccine and costs.  recommend Shingrix which is 2 doses 2-6 months apart and over 90% effective    Covid-19: 06/28/2019, 07/19/2019  Advanced directives: No; Advance directive discussed with you today. I have provided a copy for you to complete at home and have notarized. Once this is complete please bring a copy in to our office so we can scan it into your chart.  Conditions/risks identified: Yes  Next appointment: Please schedule your next Medicare Wellness Visit with your Nurse Health Advisor in 1 year by calling [phone].   Preventive Care 40 Years and Older, Female Preventive care refers to lifestyle choices and visits with your health care provider that can promote health and wellness. What does preventive care include? A yearly physical exam. This is also called an annual well check. Dental exams once or twice a year. Routine eye exams. Ask your health care provider how often you should have your eyes checked. Personal lifestyle choices, including: Daily care of your teeth and gums. Regular physical activity. Eating a healthy diet. Avoiding tobacco and drug use. Limiting  alcohol use. Practicing safe sex. Taking low-dose aspirin every day. Taking vitamin and mineral supplements as recommended by your health care provider. What happens during an annual well check? The services and screenings done by your health care provider during your annual well check will depend on your age, overall health, lifestyle risk factors, and family history of disease. Counseling  Your health care provider may ask you questions about your: Alcohol use. Tobacco use. Drug use. Emotional well-being. Home and relationship well-being. Sexual activity. Eating habits. History of falls. Memory and ability to understand (cognition). Work and work Statistician. Reproductive health. Screening  You may have the following tests or measurements: Height, weight, and BMI. Blood pressure. Lipid and cholesterol levels. These may be checked every 5 years, or more frequently if you are over 41 years old. Skin check. Lung cancer screening. You may have this screening every year starting at age 64 if you have a 30-pack-year history of smoking and currently smoke or have quit within the past 15 years. Fecal occult blood test (FOBT) of the stool. You may have this test every year starting at age 67. Flexible sigmoidoscopy or colonoscopy. You may have a sigmoidoscopy every 5 years or a colonoscopy every 10 years starting at age 20. Hepatitis C blood test. Hepatitis B blood test. Sexually transmitted disease (STD) testing. Diabetes screening. This is done by checking your blood sugar (glucose) after you have not eaten for a while (fasting). You may have this done every 1-3 years. Bone density scan. This is done to screen for osteoporosis. You may have this done starting at age 66. Mammogram. This may be done every 1-2 years. Talk to your health care provider about how often  you should have regular mammograms. Talk with your health care provider about your test results, treatment options, and if  necessary, the need for more tests. Vaccines  Your health care provider may recommend certain vaccines, such as: Influenza vaccine. This is recommended every year. Tetanus, diphtheria, and acellular pertussis (Tdap, Td) vaccine. You may need a Td booster every 10 years. Zoster vaccine. You may need this after age 41. Pneumococcal 13-valent conjugate (PCV13) vaccine. One dose is recommended after age 36. Pneumococcal polysaccharide (PPSV23) vaccine. One dose is recommended after age 9. Talk to your health care provider about which screenings and vaccines you need and how often you need them. This information is not intended to replace advice given to you by your health care provider. Make sure you discuss any questions you have with your health care provider. Document Released: 04/19/2015 Document Revised: 12/11/2015 Document Reviewed: 01/22/2015 Elsevier Interactive Patient Education  2017 [place] Prevention in the Home Falls can cause injuries. They can happen to people of all ages. There are many things you can do to make your home safe and to help prevent falls. What can I do on the outside of my home? Regularly fix the edges of walkways and driveways and fix any cracks. Remove anything that might make you trip as you walk through a door, such as a raised step or threshold. Trim any bushes or trees on the path to your home. Use bright outdoor lighting. Clear any walking paths of anything that might make someone trip, such as rocks or tools. Regularly check to see if handrails are loose or broken. Make sure that both sides of any steps have handrails. Any raised decks and porches should have guardrails on the edges. Have any leaves, snow, or ice cleared regularly. Use sand or salt on walking paths during winter. Clean up any spills in your garage right away. This includes oil or grease spills. What can I do in the bathroom? Use night lights. Install grab bars by the toilet  and in the tub and shower. Do not use towel bars as grab bars. Use non-skid mats or decals in the tub or shower. If you need to sit down in the shower, use a plastic, non-slip stool. Keep the floor dry. Clean up any water that spills on the floor as soon as it happens. Remove soap buildup in the tub or shower regularly. Attach bath mats securely with double-sided non-slip rug tape. Do not have throw rugs and other things on the floor that can make you trip. What can I do in the bedroom? Use night lights. Make sure that you have a light by your bed that is easy to reach. Do not use any sheets or blankets that are too big for your bed. They should not hang down onto the floor. Have a firm chair that has side arms. You can use this for support while you get dressed. Do not have throw rugs and other things on the floor that can make you trip. What can I do in the kitchen? Clean up any spills right away. Avoid walking on wet floors. Keep items that you use a lot in easy-to-reach places. If you need to reach something above you, use a strong step stool that has a grab bar. Keep electrical cords out of the way. Do not use floor polish or wax that makes floors slippery. If you must use wax, use non-skid floor wax. Do not have throw rugs and other things on  the floor that can make you trip. What can I do with my stairs? Do not leave any items on the stairs. Make sure that there are handrails on both sides of the stairs and use them. Fix handrails that are broken or loose. Make sure that handrails are as long as the stairways. Check any carpeting to make sure that it is firmly attached to the stairs. Fix any carpet that is loose or worn. Avoid having throw rugs at the top or bottom of the stairs. If you do have throw rugs, attach them to the floor with carpet tape. Make sure that you have a light switch at the top of the stairs and the bottom of the stairs. If you do not have them, ask someone to add  them for you. What else can I do to help prevent falls? Wear shoes that: Do not have high heels. Have rubber bottoms. Are comfortable and fit you well. Are closed at the toe. Do not wear sandals. If you use a stepladder: Make sure that it is fully opened. Do not climb a closed stepladder. Make sure that both sides of the stepladder are locked into place. Ask someone to hold it for you, if possible. Clearly mark and make sure that you can see: Any grab bars or handrails. First and last steps. Where the edge of each step is. Use tools that help you move around (mobility aids) if they are needed. These include: Canes. Walkers. Scooters. Crutches. Turn on the lights when you go into a dark area. Replace any light bulbs as soon as they burn out. Set up your furniture so you have a clear path. Avoid moving your furniture around. If any of your floors are uneven, fix them. If there are any pets around you, be aware of where they are. Review your medicines with your doctor. Some medicines can make you feel dizzy. This can increase your chance of falling. Ask your doctor what other things that you can do to help prevent falls. This information is not intended to replace advice given to you by your health care provider. Make sure you discuss any questions you have with your health care provider. Document Released: 01/17/2009 Document Revised: 08/29/2015 Document Reviewed: 04/27/2014 Elsevier Interactive Patient Education  2017 [employer].

## 2021-10-21 NOTE — Progress Notes (Addendum)
I connected with [name] today by telephone and verified that I am speaking with the correct person using two identifiers. Location patient: home Location provider: work Persons participating in the virtual visit: patient, provider.   I discussed the limitations, risks, security and privacy concerns of performing an evaluation and management service by telephone and the availability of in person appointments. I also discussed with the patient that there may be a patient responsible charge related to this service. The patient expressed understanding and verbally consented to this telephonic visit.    Interactive audio and video telecommunications were attempted between this provider and patient, however failed, due to patient having technical difficulties OR patient did not have access to video capability.  We continued and completed visit with audio only.  Some vital signs may be absent or patient reported.   Time Spent with patient on telephone encounter: 30 minutes  Subjective:   [name] is a 66 y.o. female who presents for an Initial Medicare Annual Wellness Visit.  Review of Systems     Cardiac Risk Factors include: advanced age (>18mn, >>50women);hypertension;obesity (BMI >30kg/m2);dyslipidemia     Objective:    There were no vitals filed for this visit. There is no height or weight on file to calculate BMI.     10/21/2021    3:51 PM 11/10/2018    2:21 PM  Advanced Directives  Does Patient Have a Medical Advance Directive? No No  Would patient like information on creating a medical advance directive? Yes (MAU/Ambulatory/Procedural Areas - Information given)     Current Medications (verified) Outpatient Encounter Medications as of 10/21/2021  Medication Sig   ALPRAZolam (XANAX) 0.5 MG tablet    amLODipine (NORVASC) 10 MG tablet TAKE 1 TABLET(10 MG) BY MOUTH DAILY.   aspirin EC 81 MG tablet Take 1 tablet (81 mg total) by mouth daily.   citalopram (CELEXA) 10 MG  tablet TAKE 1 TABLET(10 MG) BY MOUTH DAILY   estradiol (ESTRACE) 2 MG tablet Take 2 mg by mouth daily.   gabapentin (NEURONTIN) 300 MG capsule Take 1 capsule (300 mg total) by mouth 3 (three) times daily as needed (nerve pain).   levothyroxine (SYNTHROID) 125 MCG tablet TAKE 1 TABLET(125 MCG) BY MOUTH DAILY   lisinopril (ZESTRIL) 40 MG tablet Take 1 tablet (40 mg total) by mouth daily.   zolpidem (AMBIEN) 10 MG tablet TAKE 1 TABLET BY MOUTH AT BEDTIME AS NEEDED   [DISCONTINUED] iron polysaccharides (NU-IRON) 150 MG capsule Take 1 capsule (150 mg total) by mouth daily. (Patient not taking: Reported on 10/21/2021)   No facility-administered encounter medications on file as of 10/21/2021.    Allergies (verified) Patient has no known allergies.   History: Past Medical History:  Diagnosis Date   ALLERGIC RHINITIS 11/02/2006   Allergy    Anemia 2022   been taking Iron   Arthritis    Cervical disc disease 04/15/2011   Chronic maxillary sinusitis 10/05/2008   Chronic neck pain 04/15/2011   GERD 11/02/2006   HYPERLIPIDEMIA 01/10/2007   Hyperlipidemia 01/10/2007   Qualifier: Diagnosis of  By: JJenny ReichmannMD, JHunt Oris   HYPERTENSION 01/10/2007   HYPOTHYROIDISM 11/02/2006   NEPHROLITHIASIS, HX OF 01/10/2007   PMR (polymyalgia rheumatica) ([place] 11/10/2018   SINUSITIS- ACUTE-NOS 04/03/2010   Past Surgical History:  Procedure Laterality Date   ABDOMINAL HYSTERECTOMY  04/06/1994   COLONOSCOPY     PARTIAL HYSTERECTOMY     has ovaries   s/p selective nerve root block 2002  per Dr. 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  Tobacco Use   Smoking status: Never   Smokeless tobacco: Never  Substance and Sexual Activity   Alcohol use: No   Drug use: No   Sexual activity: Yes    Partners: Male  Other Topics Concern   Not on file  Social History Narrative   Not on file   Social Determinants of Health   Financial Resource Strain: Low Risk  (10/21/2021)   Overall Financial Resource Strain (CARDIA)    Difficulty of Paying Living Expenses: Not hard at all  Food Insecurity: No Food Insecurity (10/21/2021)   Hunger Vital Sign    Worried About Running Out of Food in the Last Year: Never true    Ran Out of Food in the Last Year: Never true  Transportation Needs: No Transportation Needs (10/21/2021)   PRAPARE - Hydrologist (Medical): No    Lack of Transportation (Non-Medical): No  Physical Activity: Sufficiently Active (10/21/2021)   Exercise Vital Sign    Days of Exercise per Week: 5 days    Minutes of Exercise per Session: 30 min  Stress: No Stress Concern Present (10/21/2021)   [place]    Feeling of Stress : Not at all  Social Connections: [place] (10/21/2021)   Social Connection and Isolation Panel [NHANES]    Frequency of Communication with Friends and Family: More than three times a week    Frequency of Social Gatherings with Friends and Family: Once a week    Attends Religious Services: More than 4 times per year    Active Member of [employer] or Organizations: Yes    Attends Music therapist: More than 4 times per year    Marital Status: Married    Tobacco Counseling Counseling given: Not Answered   Clinical Intake:  Pre-visit preparation completed: Yes  Pain : No/denies  pain     BMI - recorded: 30.53 Nutritional Status: BMI > 30  Obese Nutritional Risks: None Diabetes: No  How often do you need to have someone help you when you read instructions, pamphlets, or other written materials from your doctor or pharmacy?: 1 - Never What is the last grade level you completed in school?: HSG  Diabetic? no  Interpreter Needed?: No  Information entered by :: Lisette Abu, LPN.   Activities of Daily Living    10/21/2021    3:58 PM  In your present state of health, do you have any difficulty performing the following activities:  Hearing? 0  Vision? 0  Difficulty concentrating or making decisions? 0  Walking or climbing stairs? 0  Dressing or bathing? 0  Doing  errands, shopping? 0  Preparing Food and eating ? N  Using the Toilet? N  In the past six months, have you accidently leaked urine? Y  Do you have problems with loss of bowel control? N  Managing your Medications? N  Managing your Finances? N  Housekeeping or managing your Housekeeping? N    Patient Care Team: Biagio Borg, MD as PCP - General Dunn, Warnell Bureau as Consulting Physician (Optometry)  Indicate any recent Medical Services you may have received from other than [facility] providers in the past year (date may be approximate).     Assessment:   This is a routine wellness examination for [place].  Hearing/Vision screen Hearing Screening - Comments:: Patient denied any hearing difficulty.   No hearing aids.  Vision Screening - Comments:: Patient does wear corrective lenses/contacts.  Eye exam done by: [facility]    Dietary issues and exercise activities discussed: Current Exercise Habits: Home exercise routine, Type of exercise: walking, Time (Minutes): 30, Frequency (Times/Week): 5, Weekly Exercise (Minutes/Week): 150, Intensity: Moderate, Exercise limited by: None identified   Goals Addressed             This Visit's Progress    My goal is to increase my walking  and improve my eating habits.        Depression Screen    10/21/2021    3:57 PM 07/17/2021    8:19 AM 07/17/2021    7:58 AM 06/03/2021    3:24 PM 07/11/2020    9:40 AM 07/11/2020    9:11 AM 07/11/2020    9:09 AM  PHQ 2/9 Scores  PHQ - 2 Score 0 0 0 3 1 0 0  PHQ- 9 Score   1 8       Fall Risk    10/21/2021    3:53 PM 07/17/2021    8:15 AM 07/17/2021    7:58 AM 06/03/2021    3:24 PM 07/11/2020    9:40 AM  Fall Risk   Falls in the past year? 0 0 0 0 0  Number falls in past yr: 0 0 0    Injury with Fall? 0 0 0    Risk for fall due to : No Fall Risks      Follow up Falls evaluation completed        FALL RISK PREVENTION PERTAINING TO THE HOME:  Any stairs in or around the home? Yes  If so, are there any without handrails? No  Home free of loose throw rugs in walkways, pet beds, electrical cords, etc? Yes  Adequate lighting in your home to reduce risk of falls? Yes   ASSISTIVE DEVICES UTILIZED TO PREVENT FALLS:  Life alert? No  Use of a cane, walker or w/c? No  Grab bars in the bathroom? Yes  Shower chair or bench in shower? Yes  Elevated toilet seat or a handicapped toilet? Yes   TIMED UP AND GO:  Was the test performed? No .  Length of time to ambulate 10 feet: n/a sec.   Appearance of gait: Gait not evaluated during this visit.  Cognitive Function:        10/21/2021    4:11 PM  6CIT Screen  What Year? 0 points  What month? 0 points  What time? 0 points  Count back from 20 0 points  Months in reverse 0 points  Repeat phrase 0 points  Total Score 0 points    Immunizations Immunization History  Administered Date(s) Administered  Influenza Inj Mdck Quad Pf 01/23/2018   Influenza Whole 01/23/2008, 01/21/2009   Influenza,inj,Quad PF,6+ Mos 02/01/2017, 01/16/2018   Influenza-Unspecified 02/05/2015, 01/16/2018, 01/23/2021   PFIZER(Purple Top)SARS-COV-2 Vaccination 06/28/2019, 07/19/2019   PNEUMOCOCCAL CONJUGATE-20 07/17/2021   Td 02/06/2009   Tdap 07/11/2020     TDAP status: Up to date  Flu Vaccine status: Up to date  Pneumococcal vaccine status: Up to date  Covid-19 vaccine status: Completed vaccines  Qualifies for Shingles Vaccine? Yes   Zostavax completed No   Shingrix Completed?: No.    Education has been provided regarding the importance of this vaccine. Patient has been advised to call insurance company to determine out of pocket expense if they have not yet received this vaccine. Advised may also receive vaccine at local pharmacy or Health Dept. Verbalized acceptance and understanding.  Screening Tests Health Maintenance  Topic Date Due   Zoster Vaccines- Shingrix (1 of 2) Never done   DEXA SCAN  07/18/2022 (Originally 07/26/2020)   INFLUENZA VACCINE  11/04/2021   MAMMOGRAM  10/25/2022   TETANUS/TDAP  07/12/2030   COLONOSCOPY (Pts 45-2yr Insurance coverage will need to be confirmed)  10/12/2030   Pneumonia Vaccine 66 Years old  Completed   Hepatitis C Screening  Completed   HPV VACCINES  Aged Out   COVID-19 Vaccine  Discontinued    Health Maintenance  Health Maintenance Due  Topic Date Due   Zoster Vaccines- Shingrix (1 of 2) Never done    Colorectal cancer screening: Type of screening: Colonoscopy. Completed 10/11/2020. Repeat every 10 years  Mammogram status: Completed 10/24/2020. Repeat every year  Bone Density Status: no record  Lung Cancer Screening: (Low Dose CT Chest recommended if Age 165-80years, 30 pack-year currently smoking OR have quit w/in 15years.) does not qualify.   Lung Cancer Screening Referral: no  Additional Screening:  Hepatitis C Screening: does qualify; Completed 06/29/2017  Vision Screening: Recommended annual ophthalmology exams for early detection of glaucoma and other disorders of the eye. Is the patient up to date with their annual eye exam?  Yes  Who is the provider or what is the name of the office in which the patient attends annual [place] exams[place]  established with a provider, would they like to be referred to a provider to establish care? No .   Dental Screening: Recommended annual dental exams for proper oral hygiene  Community Resource Referral / Chronic Care Management: CRR required this visit?  No   CCM required this visit?  No      Plan:     I have personally reviewed and noted the following in the patient's chart:   Medical and social history Use of alcohol, tobacco or illicit drugs  Current medications and supplements including opioid prescriptions. Patient is not currently taking opioid prescriptions. Functional ability and status Nutritional status Physical activity Advanced directives List of other physicians Hospitalizations, surgeries, and ER visits in previous 12 months Vitals Screenings to include cognitive, depression, and falls Referrals and appointments  In addition, I have reviewed and discussed with patient certain preventive protocols, quality metrics, and best practice recommendations. A written personalized care plan for preventive services as well as general preventive health recommendations were provided to patient.     SSheral Flow LPN   [id number]  Nurse Notes:  Patient is cogitatively intact. There were no vitals filed for this visit. There is no height or weight on file to calculate BMI. Patient stated that she  has no issues with gait or balance; does not use any assistive devices. Medications reviewed with patient; no opioid use noted.

## 2021-10-30 DIAGNOSIS — E039 Hypothyroidism, unspecified: Secondary | ICD-10-CM | POA: Diagnosis not present

## 2021-10-30 DIAGNOSIS — Z8262 Family history of osteoporosis: Secondary | ICD-10-CM | POA: Diagnosis not present

## 2021-10-30 DIAGNOSIS — Z01419 Encounter for gynecological examination (general) (routine) without abnormal findings: Secondary | ICD-10-CM | POA: Diagnosis not present

## 2021-10-30 DIAGNOSIS — N958 Other specified menopausal and perimenopausal disorders: Secondary | ICD-10-CM | POA: Diagnosis not present

## 2021-10-30 DIAGNOSIS — Z6831 Body mass index (BMI) 31.0-31.9, adult: Secondary | ICD-10-CM | POA: Diagnosis not present

## 2021-10-30 DIAGNOSIS — Z1231 Encounter for screening mammogram for malignant neoplasm of breast: Secondary | ICD-10-CM | POA: Diagnosis not present

## 2021-10-30 DIAGNOSIS — R29890 Loss of height: Secondary | ICD-10-CM | POA: Diagnosis not present

## 2021-11-04 ENCOUNTER — Other Ambulatory Visit: Payer: Self-pay | Admitting: Obstetrics and Gynecology

## 2021-11-04 DIAGNOSIS — R0989 Other specified symptoms and signs involving the circulatory and respiratory systems: Secondary | ICD-10-CM

## 2021-11-06 ENCOUNTER — Other Ambulatory Visit: Payer: Self-pay | Admitting: Obstetrics and Gynecology

## 2021-11-06 ENCOUNTER — Ambulatory Visit
Admission: RE | Admit: 2021-11-06 | Discharge: 2021-11-06 | Disposition: A | Discharge status: Home / Self Care | Payer: Medicare HMO | Source: Ambulatory Visit | Attending: Obstetrics and Gynecology | Admitting: Obstetrics and Gynecology

## 2021-11-06 DIAGNOSIS — R0989 Other specified symptoms and signs involving the circulatory and respiratory systems: Secondary | ICD-10-CM

## 2021-11-06 DIAGNOSIS — Z136 Encounter for screening for cardiovascular disorders: Secondary | ICD-10-CM | POA: Diagnosis not present

## 2021-11-06 DIAGNOSIS — Z87891 Personal history of nicotine dependence: Secondary | ICD-10-CM | POA: Diagnosis not present

## 2021-11-18 ENCOUNTER — Ambulatory Visit (INDEPENDENT_AMBULATORY_CARE_PROVIDER_SITE_OTHER): Payer: Medicare HMO | Admitting: Internal Medicine

## 2021-11-18 VITALS — BP 128/72 | HR 74 | Temp 97.9°F | Ht 66.5 in | Wt 197.0 lb

## 2021-11-18 DIAGNOSIS — B009 Herpesviral infection, unspecified: Secondary | ICD-10-CM | POA: Insufficient documentation

## 2021-11-18 DIAGNOSIS — R739 Hyperglycemia, unspecified: Secondary | ICD-10-CM | POA: Diagnosis not present

## 2021-11-18 DIAGNOSIS — I10 Essential (primary) hypertension: Secondary | ICD-10-CM | POA: Diagnosis not present

## 2021-11-18 DIAGNOSIS — R0989 Other specified symptoms and signs involving the circulatory and respiratory systems: Secondary | ICD-10-CM | POA: Insufficient documentation

## 2021-11-18 DIAGNOSIS — E7800 Pure hypercholesterolemia, unspecified: Secondary | ICD-10-CM | POA: Diagnosis not present

## 2021-11-18 DIAGNOSIS — R87619 Unspecified abnormal cytological findings in specimens from cervix uteri: Secondary | ICD-10-CM | POA: Insufficient documentation

## 2021-11-18 NOTE — Progress Notes (Signed)
Patient ID: [name], female   DOB: [birth year], 66 y.o.   MRN: [id number]        Chief Complaint: follow up abd bruit, hyperglycemia, htn, hld       HPI:  [name] is a 66 y.o. female here witih finding of upper abd bruit per GYN and asked to f/u here; has has Aortic u/s neg for AAA; Pt denies chest pain, increased sob or doe, wheezing, orthopnea, PND, increased LE swelling, palpitations, dizziness or syncope.   Pt denies polydipsia, polyuria, or new focal neuro s/s.   Denies worsening reflux, abd pain, dysphagia, n/v, bowel change or blood.   Pt denies fever, wt loss, night sweats, loss of appetite, or other constitutional symptoms         Wt Readings from Last 3 Encounters:  11/18/21 197 lb (89.4 kg)  07/17/21 192 lb (87.1 kg)  06/03/21 190 lb 8 oz (86.4 kg)   BP Readings from Last 3 Encounters:  11/18/21 128/72  07/17/21 112/60  06/03/21 130/62         Past Medical History:  Diagnosis Date   ALLERGIC RHINITIS 11/02/2006   Allergy    Anemia 2022   been taking Iron   Arthritis    Cervical disc disease 04/15/2011   Chronic maxillary sinusitis 10/05/2008   Chronic neck pain 04/15/2011   GERD 11/02/2006   HYPERLIPIDEMIA 01/10/2007   Hyperlipidemia 01/10/2007   Qualifier: Diagnosis of  By: Jenny Reichmann MD, Hunt Oris    HYPERTENSION 01/10/2007   HYPOTHYROIDISM 11/02/2006   NEPHROLITHIASIS, HX OF 01/10/2007   PMR (polymyalgia rheumatica) ([place]) 11/10/2018   SINUSITIS- ACUTE-NOS 04/03/2010   Past Surgical History:  Procedure Laterality Date   ABDOMINAL HYSTERECTOMY  04/06/1994   COLONOSCOPY     PARTIAL HYSTERECTOMY     has ovaries   s/p selective nerve root block 2002     per Dr. Louanne Skye to right neck   TONSILLECTOMY  04/07/1959   TONSILLECTOMY     age 72   TUBAL LIGATION      reports that she has never smoked. She has never used smokeless tobacco. She reports that she does not drink alcohol and does not use drugs. family history includes Colon polyps in her father; Diabetes  in an other family member; Hypertension in an other family member; Hypothyroidism in her mother. No Known Allergies Current Outpatient Medications on File Prior to Visit  Medication Sig Dispense Refill   ALPRAZolam (XANAX) 0.5 MG tablet   2   amLODipine (NORVASC) 10 MG tablet TAKE 1 TABLET(10 MG) BY MOUTH DAILY. 90 tablet 3   aspirin EC 81 MG tablet Take 1 tablet (81 mg total) by mouth daily. 90 tablet 11   citalopram (CELEXA) 10 MG tablet TAKE 1 TABLET(10 MG) BY MOUTH DAILY 90 tablet 3   gabapentin (NEURONTIN) 300 MG capsule Take 1 capsule (300 mg total) by mouth 3 (three) times daily as needed (nerve pain). 180 capsule 3   iron polysaccharides (NU-IRON) 150 MG capsule Take 1 capsule (150 mg total) by mouth daily. 90 capsule 1   levothyroxine (SYNTHROID) 125 MCG tablet TAKE 1 TABLET(125 MCG) BY MOUTH DAILY 90 tablet 3   lisinopril (ZESTRIL) 40 MG tablet Take 1 tablet (40 mg total) by mouth daily. 90 tablet 3   zolpidem (AMBIEN) 10 MG tablet TAKE 1 TABLET BY MOUTH AT BEDTIME AS NEEDED 90 tablet 1   No current facility-administered medications on file prior to visit.  ROS:  All others reviewed and negative.  Objective        PE:  BP 128/72 (BP Location: Right Arm, Patient Position: Sitting, Cuff Size: Large)   Pulse 74   Temp 97.9 F (36.6 C) (Oral)   Ht 5' 6.5" (1.689 m)   Wt 197 lb (89.4 kg)   SpO2 96%   BMI 31.32 kg/m                 Constitutional: Pt appears in NAD               HENT: Head: NCAT.                Right Ear: External ear normal.                 Left Ear: External ear normal.                Eyes: . Pupils are equal, round, and reactive to light. Conjunctivae and EOM are normal               Nose: without d/c or deformity               Neck: Neck supple. Gross normal ROM               Cardiovascular: Normal rate and regular rhythm.                 Pulmonary/Chest: Effort normal and breath sounds without rales or wheezing.                Abd:  Soft, NT, ND,  + BS, no organomegaly with gr 2/6 upper abd bruit               Neurological: Pt is alert. At baseline orientation, motor grossly intact               Skin: Skin is warm. No rashes, no other new lesions, LE edema - none               Psychiatric: Pt behavior is normal without agitation   Micro: none  Cardiac tracings I have personally interpreted today:  none  Pertinent Radiological findings (summarize): none   Lab Results  Component Value Date   WBC 6.5 07/17/2021   HGB 13.1 07/17/2021   HCT 39.3 07/17/2021   PLT 230.0 07/17/2021   GLUCOSE 88 07/17/2021   CHOL 190 07/17/2021   TRIG 152.0 (H) 07/17/2021   HDL 67.20 07/17/2021   LDLDIRECT 109.0 07/11/2020   LDLCALC 93 07/17/2021   ALT 13 07/17/2021   AST 16 07/17/2021   NA 137 07/17/2021   K 4.6 07/17/2021   CL 102 07/17/2021   CREATININE 0.73 07/17/2021   BUN 11 07/17/2021   CO2 29 07/17/2021   TSH 1.71 07/17/2021   HGBA1C 5.2 07/17/2021   Assessment/Plan:  [name] is a 66 y.o. White or Caucasian [1] female with  has a past medical history of ALLERGIC RHINITIS (11/02/2006), Allergy, Anemia (2022), Arthritis, Cervical disc disease (04/15/2011), Chronic maxillary sinusitis (10/05/2008), Chronic neck pain (04/15/2011), GERD (11/02/2006), HYPERLIPIDEMIA (01/10/2007), Hyperlipidemia (01/10/2007), HYPERTENSION (01/10/2007), HYPOTHYROIDISM (11/02/2006), NEPHROLITHIASIS, HX OF (01/10/2007), PMR (polymyalgia rheumatica) ([place]) (11/10/2018), and SINUSITIS- ACUTE-NOS (04/03/2010).  Abdominal bruit Etiology unclear; cant r/o other such as RAS or other vascular abd artery vascular abnormal - note pt is asympt, so will check renal artery u/s, but hold on CTA abd or MRI with gado for now given radiation  exposure and contrast risk, and cont to work on modify cardiac risk factors  Essential hypertension BP Readings from Last 3 Encounters:  11/18/21 128/72  07/17/21 112/60  06/03/21 130/62   Stable, pt to continue medical treatment  norvasc 10 mg qd, lisinopril 40 mg qd   Hyperglycemia Lab Results  Component Value Date   HGBA1C 5.2 07/17/2021   Stable, pt to continue current medical treatment  - diet, wt control, excercise   Hyperlipidemia Lab Results  Component Value Date   [place] 93 07/17/2021   Stable, pt to continue current low chol diet, declines statin for now  Followup: Return if symptoms worsen or fail to improve.  Cathlean Cower, MD 11/22/2021 11:02 AM [facility]

## 2021-11-18 NOTE — Patient Instructions (Signed)
.  You will be contacted regarding the referral for: renal artery ultrasound  Please continue all other medications as before, and refills have been done if requested.  Please have the pharmacy call with any other refills you may need.  Please continue your efforts at being more active, low cholesterol diet, and weight control.  Please keep your appointments with your specialists as you may have planned

## 2021-11-21 ENCOUNTER — Ambulatory Visit
Admission: RE | Admit: 2021-11-21 | Discharge: 2021-11-21 | Disposition: A | Discharge status: Home / Self Care | Payer: Medicare HMO | Source: Ambulatory Visit | Attending: Internal Medicine | Admitting: Internal Medicine

## 2021-11-21 DIAGNOSIS — R0989 Other specified symptoms and signs involving the circulatory and respiratory systems: Secondary | ICD-10-CM

## 2021-11-22 NOTE — Assessment & Plan Note (Signed)
BP Readings from Last 3 Encounters:  11/18/21 128/72  07/17/21 112/60  06/03/21 130/62   Stable, pt to continue medical treatment norvasc 10 mg qd, lisinopril 40 mg qd

## 2021-11-22 NOTE — Assessment & Plan Note (Signed)
Lab Results  Component Value Date   LDLCALC 93 07/17/2021   Stable, pt to continue current low chol diet, declines statin for now

## 2021-11-22 NOTE — Assessment & Plan Note (Signed)
Lab Results  Component Value Date   HGBA1C 5.2 07/17/2021   Stable, pt to continue current medical treatment  - diet, wt control, excercise

## 2021-11-22 NOTE — Assessment & Plan Note (Signed)
Etiology unclear; cant r/o other such as RAS or other vascular abd artery vascular abnormal - note pt is asympt, so will check renal artery u/s, but hold on CTA abd or MRI with gado for now given radiation exposure and contrast risk, and cont to work on modify cardiac risk factors

## 2022-01-29 ENCOUNTER — Other Ambulatory Visit: Payer: Self-pay | Admitting: Family Medicine

## 2022-01-29 DIAGNOSIS — M5416 Radiculopathy, lumbar region: Secondary | ICD-10-CM

## 2022-02-02 ENCOUNTER — Other Ambulatory Visit: Payer: Self-pay | Admitting: Internal Medicine

## 2022-02-18 IMAGING — MR MR LUMBAR SPINE W/O CM
4 of 5 series · 26 of 48 positions shown · non-contrast
Comparison: Plain films December 14, 2019.

CLINICAL DATA: Low back pain, progressive neurologic deficit.

EXAM:
MRI LUMBAR SPINE WITHOUT CONTRAST
TECHNIQUE: Multiplanar, multisequence MR imaging of the lumbar spine was
performed. No intravenous contrast was administered.

[Series 2: T2 · sagittal · 4.0mm · 0.81mm/px · 6 of 15 slices shown (1 of 2)]
[im 1/15]
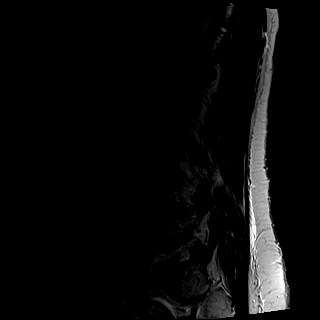
[im 3/15]
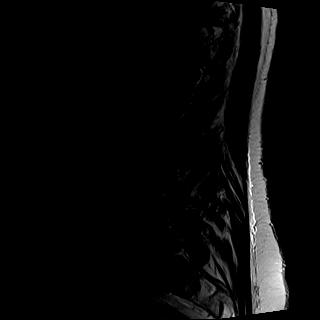
[im 6/15]
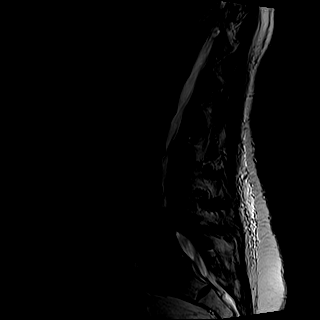
[im 9/15]
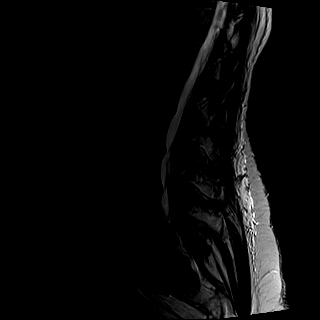
[im 12/15]
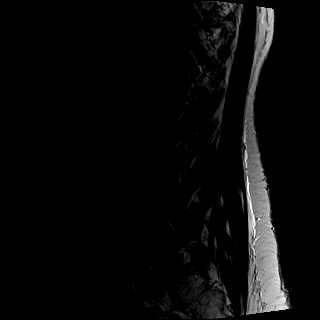
[im 15/15]
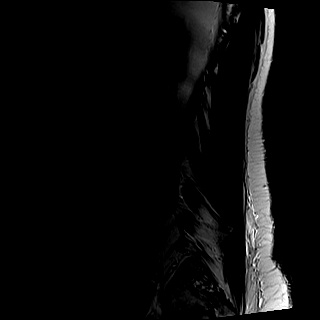

[Series 3: T1 · sagittal · 4.0mm · 0.41mm/px · 6 of 15 slices shown (1 of 2)]
[im 1/15]
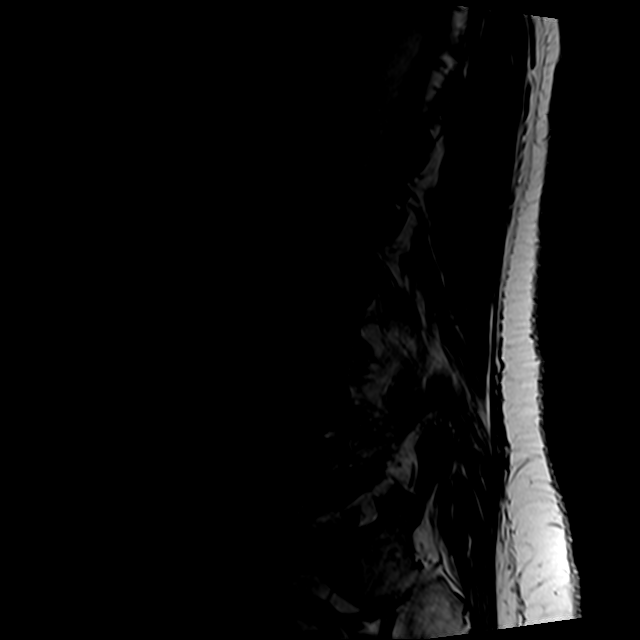
[im 3/15]
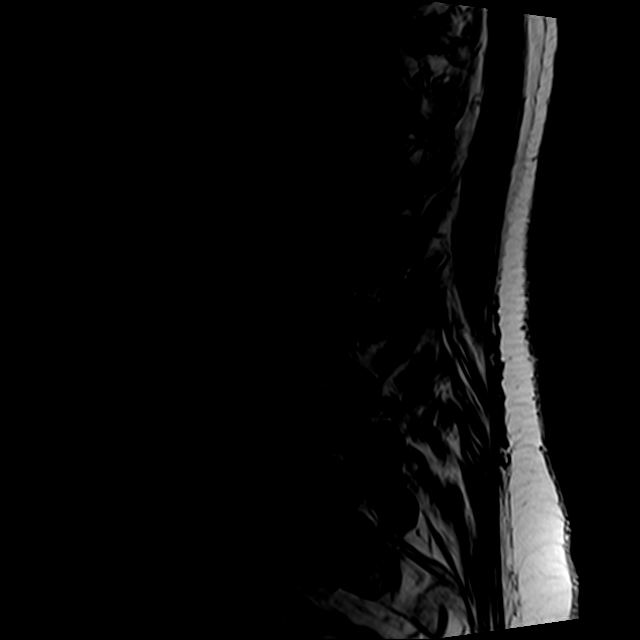
[im 6/15]
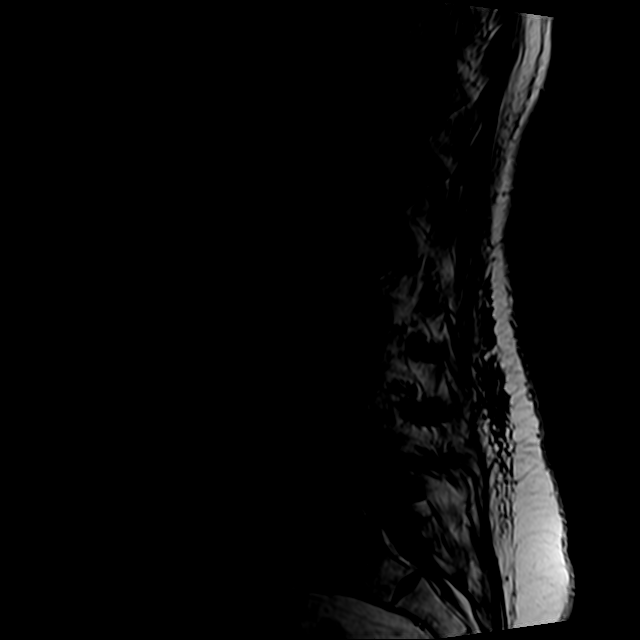
[im 9/15]
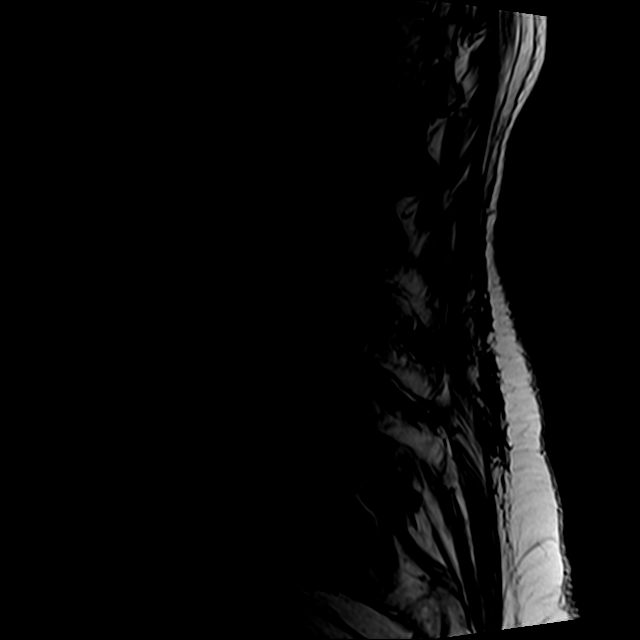
[im 12/15]
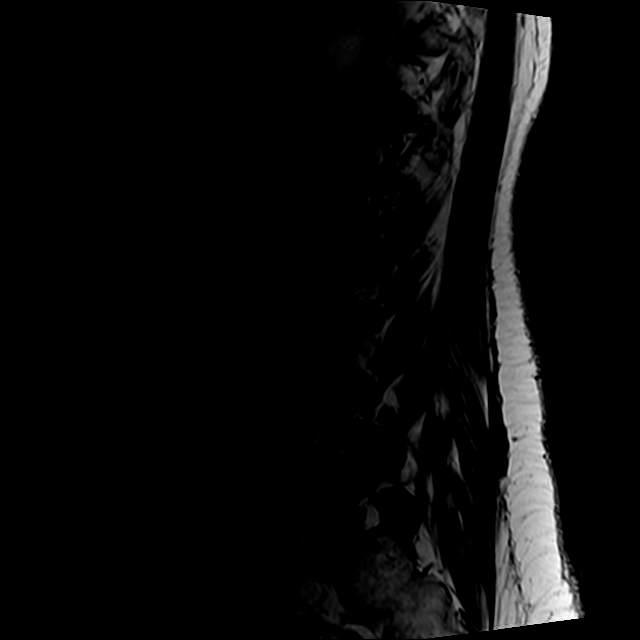
[im 15/15]
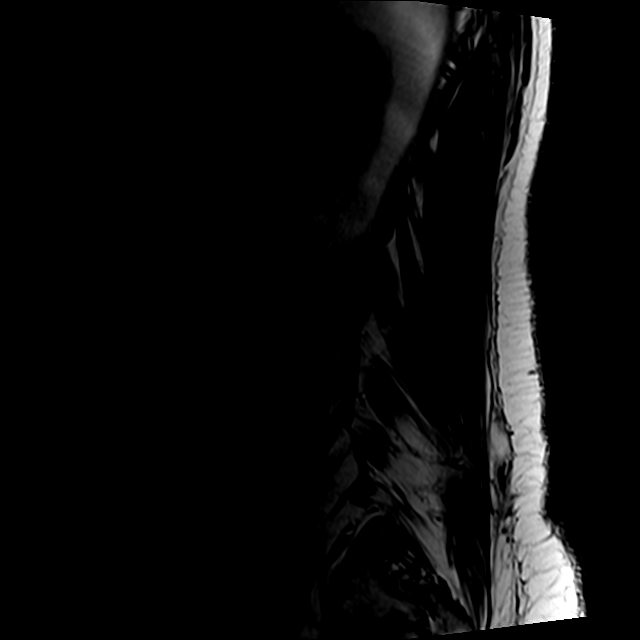

[Series 5: T2 · axial · 4.0mm · 0.78mm/px · z∈[-92,+120]mm · 9 of 41 slices shown (2 of 2)]
[im 1/41]
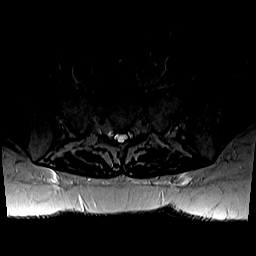
[im 6/41]
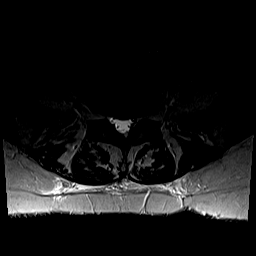
[im 12/41]
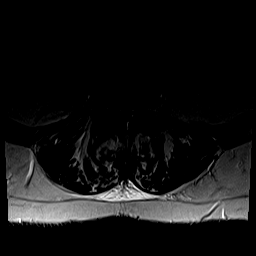
[im 18/41]
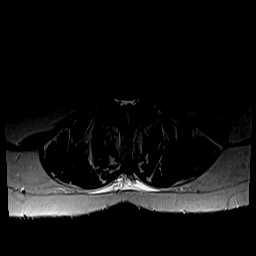
[im 21/41]
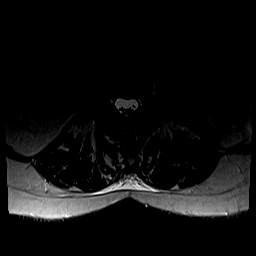
[im 23/41]
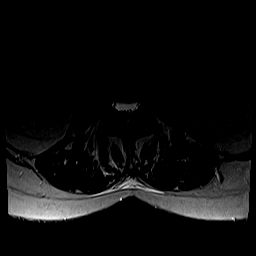
[im 29/41]
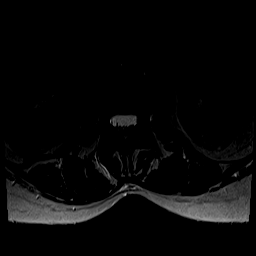
[im 35/41]
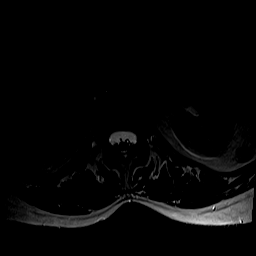
[im 41/41]
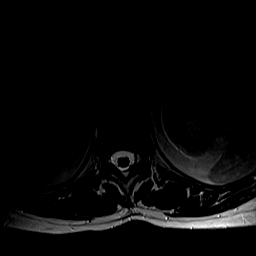

[Series 6: T1 · axial · 4.0mm · 0.39mm/px · z∈[-92,+90]mm · 5 of 41 slices shown (2 of 2)]
[im 1/41]
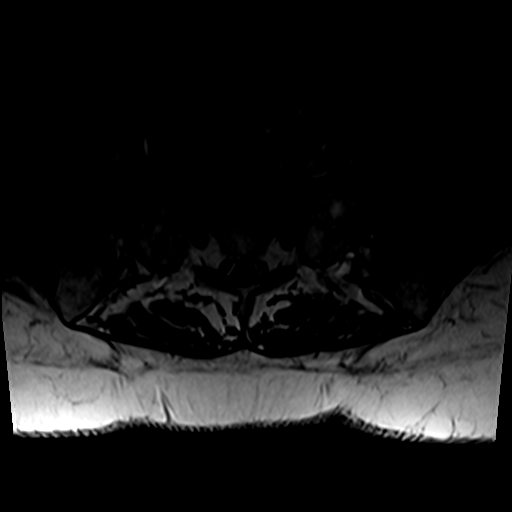
[im 6/41]
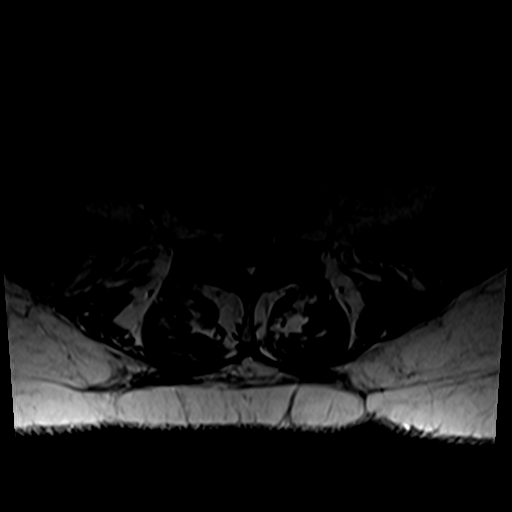
[im 12/41]
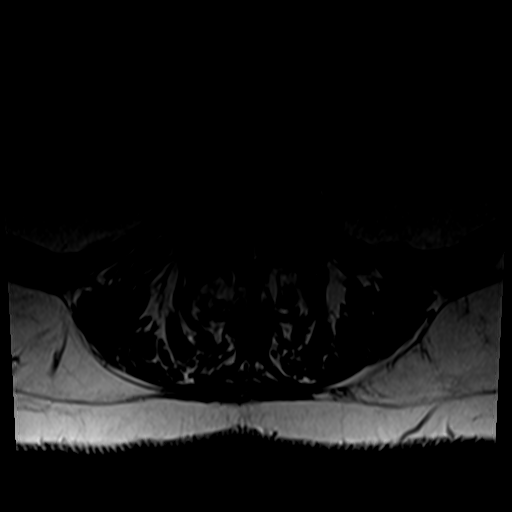
[im 21/41]
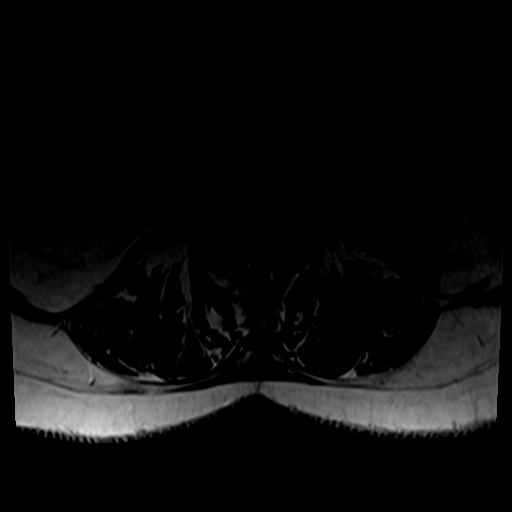
[im 35/41]
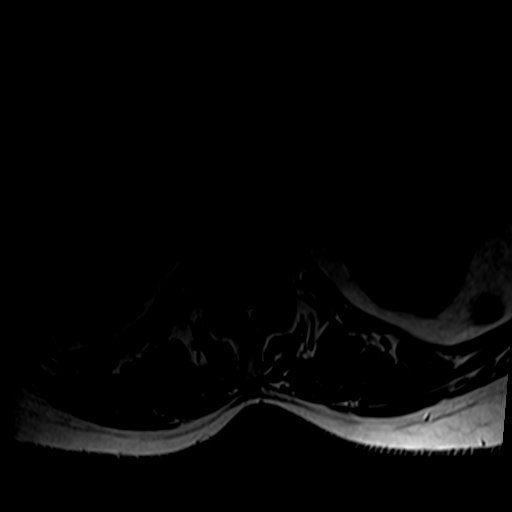

[26 of 48 positions shown; findings below may reference images not displayed]

FINDINGS: Segmentation:  Standard.

Alignment:  Small anterolisthesis of L4 over L5, degenerative.

Vertebrae: No fracture, evidence of discitis, or bone lesion.
Endplate degenerative changes at L5-S1.

Conus medullaris and cauda equina: Conus extends to the L1 level.
Conus and cauda equina appear normal.

Paraspinal and other soft tissues: Negative.

Disc levels:

T12-L1: No spinal canal or neural foraminal stenosis.

L1-2: Facet degenerative changes. No spinal canal or neural
foraminal stenosis.

L2-3: Small disc bulge and mild facet degenerative changes without
significant spinal canal or neural foraminal stenosis.

L3-4: Disc bulge, facet degenerative change ligamentum flavum
redundancy resulting in mild spinal canal stenosis. No significant
neural foraminal narrowing.

L4-5: Disc bulge with prominent hypertrophic facet degenerative
changes and ligamentum flavum redundancy resulting in severe spinal
canal stenosis. No neural foraminal narrowing.

L5-S1: Shallow disc bulge and facet degenerative changes without
significant spinal canal or neural foraminal stenosis.
IMPRESSION: 1. Multilevel degenerative changes of the lumbar spine, worst at
L4-L5 where there is severe spinal canal stenosis.
2. Mild spinal canal stenosis at L3-L4.
3. No significant neural foraminal narrowing at any level.

## 2022-04-28 ENCOUNTER — Other Ambulatory Visit: Payer: Self-pay | Admitting: Internal Medicine

## 2022-04-28 ENCOUNTER — Encounter: Payer: Self-pay | Admitting: Internal Medicine

## 2022-04-28 NOTE — Telephone Encounter (Signed)
Please advise if patient should continue iron supplements as she was given any refills previously

## 2022-04-28 NOTE — Telephone Encounter (Signed)
Yes, I had reviewed the chart and your last blood count and iron were quite good, and the iron I believe was not supposed to be a long term supplement, so I think this can be stopped for now, and we can recheck at the next visit

## 2022-05-26 ENCOUNTER — Ambulatory Visit (INDEPENDENT_AMBULATORY_CARE_PROVIDER_SITE_OTHER): Payer: Medicare HMO | Admitting: Internal Medicine

## 2022-05-26 VITALS — BP 148/86 | HR 84 | Temp 98.2°F | Ht 66.5 in | Wt 194.0 lb

## 2022-05-26 DIAGNOSIS — M7661 Achilles tendinitis, right leg: Secondary | ICD-10-CM

## 2022-05-26 DIAGNOSIS — E559 Vitamin D deficiency, unspecified: Secondary | ICD-10-CM | POA: Diagnosis not present

## 2022-05-26 DIAGNOSIS — R739 Hyperglycemia, unspecified: Secondary | ICD-10-CM

## 2022-05-26 DIAGNOSIS — I10 Essential (primary) hypertension: Secondary | ICD-10-CM | POA: Diagnosis not present

## 2022-05-26 NOTE — Patient Instructions (Signed)
Ok to use [employer] or topical OTC Voltaren gel for the right achilles tendonitis  Please call if you get worse and need to see Dr Doran Durand   Please continue all other medications as before, and refills have been done if requested.  Please have the pharmacy call with any other refills you may need.  Please continue your efforts at being more active, low cholesterol diet, and weight control.  You are otherwise up to date with prevention measures today.  Please keep your appointments with your specialists as you may have planned

## 2022-05-26 NOTE — Progress Notes (Signed)
Patient ID: [name], female   DOB: 01/11/1956, 67 y.o.   MRN: [id number]        Chief Complaint: follow up pain and swelling to hard swelling are to back of right leg, htn, hyperglycemia, low vit d       HPI:  [name] is a 67 y.o. female here with c/o 1 wk increased pain and swelling at point near the post heel, worse to walk, mild to mod, sitting helps, has not tried any otc so far.  Pt denies chest pain, increased sob or doe, wheezing, orthopnea, PND, increased LE swelling, palpitations, dizziness or syncope.   Pt denies polydipsia, polyuria, or new focal neuro s/s.    Pt denies fever, wt loss, night sweats, loss of appetite, or other constitutional symptoms  BP at home has been < 140/90        Wt Readings from Last 3 Encounters:  05/26/22 194 lb (88 kg)  11/18/21 197 lb (89.4 kg)  07/17/21 192 lb (87.1 kg)   BP Readings from Last 3 Encounters:  05/26/22 (!) 148/86  11/18/21 128/72  07/17/21 112/60         Past Medical History:  Diagnosis Date   ALLERGIC RHINITIS 11/02/2006   Allergy    Anemia 2022   been taking Iron   Arthritis    Cervical disc disease 04/15/2011   Chronic maxillary sinusitis 10/05/2008   Chronic neck pain 04/15/2011   GERD 11/02/2006   HYPERLIPIDEMIA 01/10/2007   Hyperlipidemia 01/10/2007   Qualifier: Diagnosis of  By: Jenny Reichmann MD, Hunt Oris    HYPERTENSION 01/10/2007   HYPOTHYROIDISM 11/02/2006   NEPHROLITHIASIS, HX OF 01/10/2007   PMR (polymyalgia rheumatica) ([place]) 11/10/2018   SINUSITIS- ACUTE-NOS 04/03/2010   Past Surgical History:  Procedure Laterality Date   ABDOMINAL HYSTERECTOMY  04/06/1994   COLONOSCOPY     PARTIAL HYSTERECTOMY     has ovaries   s/p selective nerve root block 2002     per Dr. Louanne Skye to right neck   TONSILLECTOMY  04/07/1959   TONSILLECTOMY     age 3   TUBAL LIGATION      reports that she has never smoked. She has never used smokeless tobacco. She reports that she does not drink alcohol and does not use  drugs. family history includes Colon polyps in her father; Diabetes in an other family member; Hypertension in an other family member; Hypothyroidism in her mother. No Known Allergies Current Outpatient Medications on File Prior to Visit  Medication Sig Dispense Refill   ALPRAZolam (XANAX) 0.5 MG tablet   2   amLODipine (NORVASC) 10 MG tablet TAKE 1 TABLET(10 MG) BY MOUTH DAILY. 90 tablet 3   aspirin EC 81 MG tablet Take 1 tablet (81 mg total) by mouth daily. 90 tablet 11   citalopram (CELEXA) 10 MG tablet TAKE 1 TABLET(10 MG) BY MOUTH DAILY 90 tablet 3   gabapentin (NEURONTIN) 300 MG capsule TAKE 1 CAPSULE THREE TIMES DAILY (NERVE PAIN) AS NEEDED 180 capsule 10   levothyroxine (SYNTHROID) 125 MCG tablet TAKE 1 TABLET(125 MCG) BY MOUTH DAILY 90 tablet 3   lisinopril (ZESTRIL) 40 MG tablet Take 1 tablet (40 mg total) by mouth daily. 90 tablet 3   zolpidem (AMBIEN) 10 MG tablet TAKE 1 TABLET BY MOUTH AT BEDTIME AS NEEDED 90 tablet 1   No current facility-administered medications on file prior to visit.        ROS:  All others reviewed and negative.  Objective  PE:  BP (!) 148/86 (BP Location: Right Arm, Patient Position: Sitting, Cuff Size: Large)   Pulse 84   Temp 98.2 F (36.8 C) (Oral)   Ht 5' 6.5" (1.689 m)   Wt 194 lb (88 kg)   SpO2 96%   BMI 30.84 kg/m                 Constitutional: Pt appears in NAD               HENT: Head: NCAT.                Right Ear: External ear normal.                 Left Ear: External ear normal.                Eyes: . Pupils are equal, round, and reactive to light. Conjunctivae and EOM are normal               Nose: without d/c or deformity               Neck: Neck supple. Gross normal ROM               Cardiovascular: Normal rate and regular rhythm.                 Pulmonary/Chest: Effort normal and breath sounds without rales or wheezing.                Right achilles with hard tender swelling near the insertion site post right heel                Neurological: Pt is alert. At baseline orientation, motor grossly intact               Skin: Skin is warm. No rashes, no other new lesions, LE edema - none               Psychiatric: Pt behavior is normal without agitation   Micro: none  Cardiac tracings I have personally interpreted today:  none  Pertinent Radiological findings (summarize): none   Lab Results  Component Value Date   WBC 6.5 07/17/2021   HGB 13.1 07/17/2021   HCT 39.3 07/17/2021   PLT 230.0 07/17/2021   GLUCOSE 88 07/17/2021   CHOL 190 07/17/2021   TRIG 152.0 (H) 07/17/2021   HDL 67.20 07/17/2021   LDLDIRECT 109.0 07/11/2020   LDLCALC 93 07/17/2021   ALT 13 07/17/2021   AST 16 07/17/2021   NA 137 07/17/2021   K 4.6 07/17/2021   CL 102 07/17/2021   CREATININE 0.73 07/17/2021   BUN 11 07/17/2021   CO2 29 07/17/2021   TSH 1.71 07/17/2021   HGBA1C 5.2 07/17/2021   Assessment/Plan:  [name] is a 67 y.o. White or Caucasian [1] female with  has a past medical history of ALLERGIC RHINITIS (11/02/2006), Allergy, Anemia (2022), Arthritis, Cervical disc disease (04/15/2011), Chronic maxillary sinusitis (10/05/2008), Chronic neck pain (04/15/2011), GERD (11/02/2006), HYPERLIPIDEMIA (01/10/2007), Hyperlipidemia (01/10/2007), HYPERTENSION (01/10/2007), HYPOTHYROIDISM (11/02/2006), NEPHROLITHIASIS, HX OF (01/10/2007), PMR (polymyalgia rheumatica) ([place]) (11/10/2018), and SINUSITIS- ACUTE-NOS (04/03/2010).  Tendonitis, Achilles, right C/w flare right achilles tendonitis - pt states will try blu emu since she has some at home, also for Volt gel prn if not helpful, consider ortho referral Dr Doran Durand if not improved  Essential hypertension BP Readings from Last 3 Encounters:  05/26/22 (!) 148/86  11/18/21 128/72  07/17/21 112/60  Uncontrolled, likely reactive,  pt to continue medical treatment norvasc 10 qd, lisinopril 40 qd   Hyperglycemia Lab Results  Component Value Date   HGBA1C 5.2 07/17/2021    Stable, pt to continue current medical treatment - diet, wt control, excercsie   Vitamin D deficiency Last vitamin D Lab Results  Component Value Date   VD25OH 51.99 07/17/2021   Stable, cont oral replacement  Followup: Return if symptoms worsen or fail to improve.  Cathlean Cower, MD 05/30/2022 2:59 PM [facility]

## 2022-05-30 ENCOUNTER — Encounter: Payer: Self-pay | Admitting: Internal Medicine

## 2022-05-30 NOTE — Assessment & Plan Note (Signed)
Lab Results  Component Value Date   HGBA1C 5.2 07/17/2021   Stable, pt to continue current medical treatment - diet, wt control, excercsie

## 2022-05-30 NOTE — Assessment & Plan Note (Signed)
Last vitamin D Lab Results  Component Value Date   VD25OH 51.99 07/17/2021   Stable, cont oral replacement

## 2022-05-30 NOTE — Assessment & Plan Note (Signed)
BP Readings from Last 3 Encounters:  05/26/22 (!) 148/86  11/18/21 128/72  07/17/21 112/60   Uncontrolled, likely reactive,  pt to continue medical treatment norvasc 10 qd, lisinopril 40 qd

## 2022-05-30 NOTE — Assessment & Plan Note (Signed)
C/w flare right achilles tendonitis - pt states will try blu emu since she has some at home, also for Volt gel prn if not helpful, consider ortho referral Dr Doran Durand if not improved

## 2022-06-11 ENCOUNTER — Encounter: Payer: Self-pay | Admitting: Internal Medicine

## 2022-06-11 DIAGNOSIS — M7661 Achilles tendinitis, right leg: Secondary | ICD-10-CM

## 2022-06-11 DIAGNOSIS — M79671 Pain in right foot: Secondary | ICD-10-CM

## 2022-06-26 ENCOUNTER — Other Ambulatory Visit: Payer: Self-pay | Admitting: Internal Medicine

## 2022-07-03 DIAGNOSIS — M67961 Unspecified disorder of synovium and tendon, right lower leg: Secondary | ICD-10-CM | POA: Diagnosis not present

## 2022-07-03 DIAGNOSIS — M7660 Achilles tendinitis, unspecified leg: Secondary | ICD-10-CM | POA: Insufficient documentation

## 2022-07-03 DIAGNOSIS — M79671 Pain in right foot: Secondary | ICD-10-CM | POA: Diagnosis not present

## 2022-07-14 DIAGNOSIS — Z0100 Encounter for examination of eyes and vision without abnormal findings: Secondary | ICD-10-CM | POA: Diagnosis not present

## 2022-07-14 DIAGNOSIS — H5213 Myopia, bilateral: Secondary | ICD-10-CM | POA: Diagnosis not present

## 2022-07-23 ENCOUNTER — Other Ambulatory Visit: Payer: Self-pay | Admitting: Internal Medicine

## 2022-07-23 ENCOUNTER — Encounter: Payer: Self-pay | Admitting: Internal Medicine

## 2022-07-23 ENCOUNTER — Ambulatory Visit (INDEPENDENT_AMBULATORY_CARE_PROVIDER_SITE_OTHER): Payer: Medicare HMO | Admitting: Internal Medicine

## 2022-07-23 VITALS — BP 124/80 | HR 73 | Temp 98.2°F | Ht 66.5 in | Wt 194.0 lb

## 2022-07-23 DIAGNOSIS — I10 Essential (primary) hypertension: Secondary | ICD-10-CM

## 2022-07-23 DIAGNOSIS — Z0001 Encounter for general adult medical examination with abnormal findings: Secondary | ICD-10-CM | POA: Diagnosis not present

## 2022-07-23 DIAGNOSIS — E559 Vitamin D deficiency, unspecified: Secondary | ICD-10-CM

## 2022-07-23 DIAGNOSIS — E538 Deficiency of other specified B group vitamins: Secondary | ICD-10-CM | POA: Diagnosis not present

## 2022-07-23 DIAGNOSIS — I739 Peripheral vascular disease, unspecified: Secondary | ICD-10-CM | POA: Diagnosis not present

## 2022-07-23 DIAGNOSIS — M5416 Radiculopathy, lumbar region: Secondary | ICD-10-CM

## 2022-07-23 DIAGNOSIS — R739 Hyperglycemia, unspecified: Secondary | ICD-10-CM | POA: Diagnosis not present

## 2022-07-23 DIAGNOSIS — E039 Hypothyroidism, unspecified: Secondary | ICD-10-CM | POA: Diagnosis not present

## 2022-07-23 DIAGNOSIS — E7800 Pure hypercholesterolemia, unspecified: Secondary | ICD-10-CM

## 2022-07-23 LAB — BASIC METABOLIC PANEL
BUN: 17 mg/dL (ref 6–23)
CO2: 29 mEq/L (ref 19–32)
Calcium: 9.7 mg/dL (ref 8.4–10.5)
Chloride: 104 mEq/L (ref 96–112)
Creatinine, Ser: 0.72 mg/dL (ref 0.40–1.20)
GFR: 86.78 mL/min (ref >60.00)
Glucose, Bld: 94 mg/dL (ref 70–99)
Potassium: 4.5 mEq/L (ref 3.5–5.1)
Sodium: 140 mEq/L (ref 135–145)

## 2022-07-23 LAB — HEPATIC FUNCTION PANEL
ALT: 15 U/L (ref 0–35)
AST: 16 U/L (ref 0–37)
Albumin: 4.4 g/dL (ref 3.5–5.2)
Alkaline Phosphatase: 99 U/L (ref 39–117)
Bilirubin, Direct: 0.1 mg/dL (ref 0.0–0.3)
Total Bilirubin: 0.4 mg/dL (ref 0.2–1.2)
Total Protein: 6.9 g/dL (ref 6.0–8.3)

## 2022-07-23 LAB — URINALYSIS, ROUTINE W REFLEX MICROSCOPIC
Bilirubin Urine: NEGATIVE
Hgb urine dipstick: NEGATIVE
Nitrite: NEGATIVE
Specific Gravity, Urine: >=1.03 — AB (ref 1.000–1.030)
Total Protein, Urine: NEGATIVE
Urine Glucose: NEGATIVE
Urobilinogen, UA: 1 (ref 0.0–1.0)
pH: 6 (ref 5.0–8.0)

## 2022-07-23 LAB — CBC WITH DIFFERENTIAL/PLATELET
Basophils Absolute: 0 10*3/uL (ref 0.0–0.1)
Basophils Relative: 0.5 % (ref 0.0–3.0)
Eosinophils Absolute: 0.3 10*3/uL (ref 0.0–0.7)
Eosinophils Relative: 3.9 % (ref 0.0–5.0)
HCT: 39.5 % (ref 36.0–46.0)
Hemoglobin: 13.2 g/dL (ref 12.0–15.0)
Lymphocytes Relative: 38 % (ref 12.0–46.0)
Lymphs Abs: 2.5 10*3/uL (ref 0.7–4.0)
MCHC: 33.4 g/dL (ref 30.0–36.0)
MCV: 88.5 fl (ref 78.0–100.0)
Monocytes Absolute: 0.5 10*3/uL (ref 0.1–1.0)
Monocytes Relative: 7.9 % (ref 3.0–12.0)
Neutro Abs: 3.3 10*3/uL (ref 1.4–7.7)
Neutrophils Relative %: 49.7 % (ref 43.0–77.0)
Platelets: 225 10*3/uL (ref 150.0–400.0)
RBC: 4.47 Mil/uL (ref 3.87–5.11)
RDW: 13 % (ref 11.5–15.5)
WBC: 6.6 10*3/uL (ref 4.0–10.5)

## 2022-07-23 LAB — LIPID PANEL
Cholesterol: 157 mg/dL (ref 0–200)
HDL: 54.3 mg/dL (ref >39.00)
LDL Cholesterol: 77 mg/dL (ref 0–99)
NonHDL: 102.92
Total CHOL/HDL Ratio: 3
Triglycerides: 132 mg/dL (ref 0.0–149.0)
VLDL: 26.4 mg/dL (ref 0.0–40.0)

## 2022-07-23 LAB — HEMOGLOBIN A1C: Hgb A1c MFr Bld: 5.3 % (ref 4.6–6.5)

## 2022-07-23 LAB — TSH: TSH: 0.08 u[IU]/mL — ABNORMAL LOW (ref 0.35–5.50)

## 2022-07-23 LAB — VITAMIN D 25 HYDROXY (VIT D DEFICIENCY, FRACTURES): VITD: 40.79 ng/mL (ref 30.00–100.00)

## 2022-07-23 LAB — VITAMIN B12: Vitamin B-12: 698 pg/mL (ref 211–911)

## 2022-07-23 MED ORDER — LEVOTHYROXINE SODIUM 112 MCG PO TABS: 112.0000 ug | ORAL_TABLET | Freq: Every day | ORAL | 3 refills | Status: DC

## 2022-07-23 MED ORDER — GABAPENTIN 300 MG PO CAPS: ORAL_CAPSULE | ORAL | 1 refills | Status: DC

## 2022-07-23 MED ORDER — LEVOTHYROXINE SODIUM 125 MCG PO TABS: ORAL_TABLET | ORAL | 3 refills | Status: DC

## 2022-07-23 MED ORDER — LISINOPRIL 40 MG PO TABS: 40.0000 mg | ORAL_TABLET | Freq: Every day | ORAL | 3 refills | Status: DC

## 2022-07-23 MED ORDER — AMLODIPINE BESYLATE 10 MG PO TABS: ORAL_TABLET | ORAL | 3 refills | Status: DC

## 2022-07-23 MED ORDER — CITALOPRAM HYDROBROMIDE 10 MG PO TABS: ORAL_TABLET | ORAL | 3 refills | Status: DC

## 2022-07-23 MED ORDER — ZOLPIDEM TARTRATE 10 MG PO TABS: 10.0000 mg | ORAL_TABLET | Freq: Every evening | ORAL | 1 refills | Status: DC | PRN

## 2022-07-23 NOTE — Progress Notes (Signed)
Patient ID: [name], female   DOB: Feb 11, 1956, 67 y.o.   MRN: [id number]         Chief Complaint:: wellness exam and PVD prob SMA origin related with bruit, hld, hyperglycemia, low thryoid, low Vit D and b12       HPI:  [name] is a 67 y.o. female here for wellness exam; for shingrix at pharmacy, for declines dxa for now, o/w up to date                        Also Pt denies chest pain, increased sob or doe, wheezing, orthopnea, PND, increased LE swelling, palpitations, dizziness or syncope.   Pt denies polydipsia, polyuria, or new focal neuro s/s.    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  Denies worsening reflux, abd pain, dysphagia, n/v, bowel change or blood.  Denies hyper or hypo thyroid symptoms such as voice, skin or hair change.     Wt Readings from Last 3 Encounters:  07/23/22 194 lb (88 kg)  05/26/22 194 lb (88 kg)  11/18/21 197 lb (89.4 kg)   BP Readings from Last 3 Encounters:  07/23/22 124/80  05/26/22 (!) 148/86  11/18/21 128/72   Immunization History  Administered Date(s) Administered   Influenza Inj Mdck Quad Pf 01/23/2018   Influenza Split 03/13/2022   Influenza Whole 01/23/2008, 01/21/2009   Influenza,inj,Quad PF,6+ Mos 02/01/2017, 01/16/2018   Influenza-Unspecified 02/05/2015, 01/16/2018, 02/05/2019, 01/23/2021, 02/04/2021   PFIZER(Purple Top)SARS-COV-2 Vaccination 06/28/2019, 07/19/2019   PNEUMOCOCCAL CONJUGATE-20 07/17/2021   Td 02/06/2009   Tdap 07/11/2020   Unspecified SARS-COV-2 Vaccination 06/16/2019, 06/30/2019   There are no preventive care reminders to display for this patient.     Past Medical History:  Diagnosis Date   ALLERGIC RHINITIS 11/02/2006   Allergy    Anemia 2022   been taking Iron   Arthritis    Cervical disc disease 04/15/2011   Chronic maxillary sinusitis 10/05/2008   Chronic neck pain 04/15/2011   GERD  11/02/2006   HYPERLIPIDEMIA 01/10/2007   Hyperlipidemia 01/10/2007   Qualifier: Diagnosis of  By: Jonny Ruiz MD, Len Blalock    HYPERTENSION 01/10/2007   HYPOTHYROIDISM 11/02/2006   NEPHROLITHIASIS, HX OF 01/10/2007   PMR (polymyalgia rheumatica) 11/10/2018   SINUSITIS- ACUTE-NOS 04/03/2010   Past Surgical History:  Procedure Laterality Date   ABDOMINAL HYSTERECTOMY  04/06/1994   COLONOSCOPY     PARTIAL HYSTERECTOMY     has ovaries   s/p selective nerve root block 2002     per Dr. Otelia Sergeant to right neck   TONSILLECTOMY  04/07/1959   TONSILLECTOMY     age 36   TUBAL LIGATION      reports that she has never smoked. She has never used smokeless tobacco. She reports that she does not drink alcohol and does not use drugs. family history includes Colon polyps in her father; Diabetes in an other family member; Hypertension in an other family member; Hypothyroidism in her mother. No Known Allergies Current Outpatient Medications on File Prior to Visit  Medication Sig Dispense Refill   ALPRAZolam (XANAX) 0.5 MG tablet   2   aspirin EC 81 MG tablet Take 1 tablet (81 mg total) by mouth daily. 90 tablet 11   No current facility-administered medications on file prior to visit.        ROS:  All others reviewed and negative.  Objective        PE:  BP 124/80 (BP Location: Right Arm, Patient Position: Sitting, Cuff Size: Normal)   Pulse 73   Temp 98.2 F (36.8 C) (Oral)   Ht 5' 6.5" (1.689 m)   Wt 194 lb (88 kg)   SpO2 98%   BMI 30.84 kg/m                 Constitutional: Pt appears in NAD               HENT: Head: NCAT.                Right Ear: External ear normal.                 Left Ear: External ear normal.                Eyes: . Pupils are equal, round, and reactive to light. Conjunctivae and EOM are normal               Nose: without d/c or deformity               Neck: Neck supple. Gross normal ROM               Cardiovascular: Normal rate and regular rhythm.                  Pulmonary/Chest: Effort normal and breath sounds without rales or wheezing.                Abd:  Soft, NT, ND, + BS, no organomegaly               Neurological: Pt is alert. At baseline orientation, motor grossly intact               Skin: Skin is warm. No rashes, no other new lesions, LE edema - none               Psychiatric: Pt behavior is normal without agitation   Micro: none  Cardiac tracings I have personally interpreted today:  none  Pertinent Radiological findings (summarize): none   Lab Results  Component Value Date   WBC 6.6 07/23/2022   HGB 13.2 07/23/2022   HCT 39.5 07/23/2022   PLT 225.0 07/23/2022   GLUCOSE 94 07/23/2022   CHOL 157 07/23/2022   TRIG 132.0 07/23/2022   HDL 54.30 07/23/2022   LDLDIRECT 109.0 07/11/2020   LDLCALC 77 07/23/2022   ALT 15 07/23/2022   AST 16 07/23/2022   NA 140 07/23/2022   K 4.5 07/23/2022   CL 104 07/23/2022   CREATININE 0.72 07/23/2022   BUN 17 07/23/2022   CO2 29 07/23/2022   TSH 0.08 (L) 07/23/2022   HGBA1C 5.3 07/23/2022   Assessment/Plan:  PARI Brock is a 67 y.o. White or Caucasian [1] female with  has a past medical history of ALLERGIC RHINITIS (11/02/2006), Allergy, Anemia (2022), Arthritis, Cervical disc disease (04/15/2011), Chronic maxillary sinusitis (10/05/2008), Chronic neck pain (04/15/2011), GERD (11/02/2006), HYPERLIPIDEMIA (01/10/2007), Hyperlipidemia (01/10/2007), HYPERTENSION (01/10/2007), HYPOTHYROIDISM (11/02/2006), NEPHROLITHIASIS, HX OF (01/10/2007), PMR (polymyalgia rheumatica) (11/10/2018), and SINUSITIS- ACUTE-NOS (04/03/2010).  Encounter for well adult exam with abnormal findings Age and sex appropriate education and counseling updated with regular exercise and diet Referrals for preventative services - declines dxa Immunizations addressed - for shingrix at pharmacy Smoking counseling  - none needed Evidence for depression or other mood disorder - none  significant Most recent labs reviewed. I have  personally reviewed and have noted: 1) the patient's medical and social history 2) The patient's current medications and supplements 3) The patient's height, weight, and BMI have been recorded in the chart   Essential hypertension BP Readings from Last 3 Encounters:  07/23/22 124/80  05/26/22 (!) 148/86  11/18/21 128/72   Stable, pt to continue medical treatment  norvasc 10 mg qd, lisinopril 40 mg qd   Hyperglycemia Lab Results  Component Value Date   HGBA1C 5.3 07/23/2022   Stable, pt to continue current medical treatment  - diet, wt control   Hyperlipidemia Lab Results  Component Value Date   LDLCALC 77 07/23/2022   Uncontrolled, goal ldl < 70, pt for lower chol diet, declines statin for now   Hypothyroidism Lab Results  Component Value Date   TSH 0.08 (L) 07/23/2022   Overcontrolled,, pt to continue levothyroxine decreased to 112 mcg qd, for f/u lab   PVD (peripheral vascular disease) Consider statin, for lower chol diet, excercise  Vitamin B12 deficiency Lab Results  Component Value Date   VITAMINB12 698 07/23/2022   Stable, cont oral replacement - b12 1000 mcg qd   Vitamin D deficiency Last vitamin D Lab Results  Component Value Date   VD25OH 40.79 07/23/2022   Stable, cont oral replacement  Followup: Return in about 1 year (around 07/23/2023).  Oliver Barre, MD 07/25/2022 8:19 PM [facility]

## 2022-07-23 NOTE — Patient Instructions (Addendum)
Please have your Shingrix (shingles) shots done at your local pharmacy.  Please continue all other medications as before, and refills have been done if requested.  Please have the pharmacy call with any other refills you may need.  Please continue your efforts at being more active, low cholesterol diet, and weight control.  You are otherwise up to date with prevention measures today.  Please keep your appointments with your specialists as you may have planned  Please go to the LAB at the blood drawing area for the tests to be done  You will be contacted by phone if any changes need to be made immediately.  Otherwise, you will receive a letter about your results with an explanation, but please check with MyChart first.  Please remember to sign up for MyChart if you have not done so, as this will be important to you in the future with finding out test results, communicating by private email, and scheduling acute appointments online when needed.  Please make an Appointment to return for your 1 year visit, or sooner if needed, with Lab testing by Appointment as well, to be done about 3-5 days before at the FIRST FLOOR Lab (so this is for TWO appointments - please see the scheduling desk as you leave)  

## 2022-07-25 ENCOUNTER — Encounter: Payer: Self-pay | Admitting: Internal Medicine

## 2022-07-25 NOTE — Assessment & Plan Note (Signed)
Age and sex appropriate education and counseling updated with regular exercise and diet Referrals for preventative services - declines dxa Immunizations addressed - for shingrix at pharmacy Smoking counseling  - none needed Evidence for depression or other mood disorder - none significant Most recent labs reviewed. I have personally reviewed and have noted: 1) the patient's medical and social history 2) The patient's current medications and supplements 3) The patient's height, weight, and BMI have been recorded in the chart

## 2022-07-25 NOTE — Assessment & Plan Note (Signed)
Consider statin, for lower chol diet, excercise

## 2022-07-25 NOTE — Assessment & Plan Note (Signed)
BP Readings from Last 3 Encounters:  07/23/22 124/80  05/26/22 (!) 148/86  11/18/21 128/72   Stable, pt to continue medical treatment  norvasc 10 mg qd, lisinopril 40 mg qd

## 2022-07-25 NOTE — Assessment & Plan Note (Signed)
Lab Results  Component Value Date   LDLCALC 77 07/23/2022   Uncontrolled, goal ldl < 70, pt for lower chol diet, declines statin for now

## 2022-07-25 NOTE — Assessment & Plan Note (Signed)
Lab Results  Component Value Date   TSH 0.08 (L) 07/23/2022   Overcontrolled,, pt to continue levothyroxine decreased to 112 mcg qd, for f/u lab

## 2022-07-25 NOTE — Assessment & Plan Note (Signed)
Lab Results  Component Value Date   VITAMINB12 698 07/23/2022   Stable, cont oral replacement - b12 1000 mcg qd

## 2022-07-25 NOTE — Assessment & Plan Note (Signed)
Last vitamin D Lab Results  Component Value Date   VD25OH 40.79 07/23/2022   Stable, cont oral replacement

## 2022-07-25 NOTE — Assessment & Plan Note (Signed)
Lab Results  Component Value Date   HGBA1C 5.3 07/23/2022   Stable, pt to continue current medical treatment  - diet, wt control

## 2022-07-27 MED ORDER — CIPROFLOXACIN HCL 500 MG PO TABS: 500.0000 mg | ORAL_TABLET | Freq: Two times a day (BID) | ORAL | 0 refills | Status: AC

## 2022-09-02 DIAGNOSIS — M67961 Unspecified disorder of synovium and tendon, right lower leg: Secondary | ICD-10-CM | POA: Diagnosis not present

## 2022-09-09 ENCOUNTER — Ambulatory Visit (INDEPENDENT_AMBULATORY_CARE_PROVIDER_SITE_OTHER): Payer: Medicare HMO | Admitting: Internal Medicine

## 2022-09-09 ENCOUNTER — Encounter: Payer: Self-pay | Admitting: Internal Medicine

## 2022-09-09 VITALS — BP 130/74 | HR 80 | Temp 98.3°F | Ht 66.5 in | Wt 196.0 lb

## 2022-09-09 DIAGNOSIS — J019 Acute sinusitis, unspecified: Secondary | ICD-10-CM

## 2022-09-09 DIAGNOSIS — I10 Essential (primary) hypertension: Secondary | ICD-10-CM | POA: Diagnosis not present

## 2022-09-09 MED ORDER — AMOXICILLIN-POT CLAVULANATE 875-125 MG PO TABS: 1.0000 | ORAL_TABLET | Freq: Two times a day (BID) | ORAL | 0 refills | Status: AC

## 2022-09-09 NOTE — Patient Instructions (Addendum)
     Medications changes include :   Augmentin twice daily x 10 days      Return if symptoms worsen or fail to improve.  

## 2022-09-09 NOTE — Progress Notes (Signed)
Subjective:    Patient ID: [name], female    DOB: 1955-10-01, 67 y.o.   MRN: [id number]      HPI [name] is here for  Chief Complaint  Patient presents with   Sinusitis    Sinus pressure; cough, sore throat, symptoms started 2 weeks ago, sometimes productive cough    Her symptoms started more than 2 weeks ago.  She has a history of sinus infections.  She states chills, nasal congestion, slight right-sided ear pain and clogged ears, PND, sinus pain and pressure, Episodic cough from postnasal drip, headaches and lightheadedness   Taking mucinex d,claritin,benadryl, nyquil, dayquil  Medications and allergies reviewed with patient and updated if appropriate.  Current Outpatient Medications on File Prior to Visit  Medication Sig Dispense Refill   ALPRAZolam (XANAX) 0.5 MG tablet   2   amLODipine (NORVASC) 10 MG tablet TAKE 1 TABLET(10 MG) BY MOUTH DAILY. 90 tablet 3   aspirin EC 81 MG tablet Take 1 tablet (81 mg total) by mouth daily. 90 tablet 11   citalopram (CELEXA) 10 MG tablet TAKE 1 TABLET(10 MG) BY MOUTH DAILY 90 tablet 3   gabapentin (NEURONTIN) 300 MG capsule TAKE 1 CAPSULE THREE TIMES DAILY (NERVE PAIN) AS NEEDED 180 capsule 1   levothyroxine (SYNTHROID) 112 MCG tablet Take 1 tablet (112 mcg total) by mouth daily. 90 tablet 3   lisinopril (ZESTRIL) 40 MG tablet Take 1 tablet (40 mg total) by mouth daily. 90 tablet 3   zolpidem (AMBIEN) 10 MG tablet Take 1 tablet (10 mg total) by mouth at bedtime as needed. 90 tablet 1   No current facility-administered medications on file prior to visit.    Review of Systems  Constitutional:  Positive for chills. Negative for fever.  HENT:  Positive for congestion, ear pain (pop, mild pain in right ear), postnasal drip, sinus pressure and sinus pain. Negative for sore throat.   Respiratory:  Positive for cough (episodic - PND). Negative for shortness of breath and wheezing.   Musculoskeletal:  Positive for myalgias.   Neurological:  Positive for light-headedness and headaches.       Objective:   Vitals:   09/09/22 1535  BP: 130/74  Pulse: 80  Temp: 98.3 F (36.8 C)  SpO2: 96%   BP Readings from Last 3 Encounters:  09/09/22 130/74  07/23/22 124/80  05/26/22 (!) 148/86   Wt Readings from Last 3 Encounters:  09/09/22 196 lb (88.9 kg)  07/23/22 194 lb (88 kg)  05/26/22 194 lb (88 kg)   Body mass index is 31.16 kg/m.    Physical Exam Constitutional:      General: She is not in acute distress.    Appearance: Normal appearance. She is not ill-appearing.  HENT:     Head: Normocephalic and atraumatic.     Right Ear: Tympanic membrane, ear canal and external ear normal.     Left Ear: Tympanic membrane, ear canal and external ear normal.     Mouth/Throat:     Mouth: Mucous membranes are moist.     Pharynx: No oropharyngeal exudate or posterior oropharyngeal erythema.  Eyes:     Conjunctiva/sclera: Conjunctivae normal.  Cardiovascular:     Rate and Rhythm: Normal rate and regular rhythm.  Pulmonary:     Effort: Pulmonary effort is normal. No respiratory distress.     Breath sounds: Normal breath sounds. No wheezing or rales.  Musculoskeletal:     Cervical back: Neck supple. No tenderness.  Lymphadenopathy:  Cervical: No cervical adenopathy.  Skin:    General: Skin is warm and dry.  Neurological:     Mental Status: She is alert.            Assessment & Plan:    Acute sinus infection: Acute History of frequent sinus infections Likely bacterial  Start Augmentin 875-125 mg BID x 10 day otc cold medications Rest, fluid Call if no improvement   Hypertension: Chronic Blood pressure well-controlled despite current infection Should be careful of certain over-the-counter cold medications-can consider Coricidin cold products Continue current medications-amlodipine 10 mg daily and lisinopril 40 mg daily  Sugars in normal range

## 2022-09-24 ENCOUNTER — Ambulatory Visit (INDEPENDENT_AMBULATORY_CARE_PROVIDER_SITE_OTHER): Payer: Medicare HMO

## 2022-09-24 VITALS — Ht 66.5 in | Wt 196.0 lb

## 2022-09-24 DIAGNOSIS — Z0000 Encounter for general adult medical examination without abnormal findings: Secondary | ICD-10-CM

## 2022-09-24 NOTE — Progress Notes (Signed)
Subjective:   [name] is a 67 y.o. female who presents for Medicare Annual (Subsequent) preventive examination.  Visit Complete: Virtual  I connected with  Glori Bickers on 09/24/22 by a audio enabled telemedicine application and verified that I am speaking with the correct person using two identifiers.  Patient Location: Home  Provider Location: Home Office  I discussed the limitations of evaluation and management by telemedicine. The patient expressed understanding and agreed to proceed.  Patient Medicare AWV questionnaire was completed by the patient on  ; I have confirmed that all information answered by patient is correct and no changes since this date.  Review of Systems      Cardiac Risk Factors include: advanced age (>40men, >93 women);hypertension     Objective:    Today's Vitals   09/24/22 1533  Weight: 196 lb (88.9 kg)  Height: 5' 6.5" (1.689 m)   Body mass index is 31.16 kg/m.     09/24/2022    3:40 PM 10/21/2021    3:51 PM 11/10/2018    2:21 PM  Advanced Directives  Does Patient Have a Medical Advance Directive? No No No  Would patient like information on creating a medical advance directive? No - Patient declined Yes (MAU/Ambulatory/Procedural Areas - Information given)     Current Medications (verified) Outpatient Encounter Medications as of 09/24/2022  Medication Sig   ALPRAZolam (XANAX) 0.5 MG tablet    amLODipine (NORVASC) 10 MG tablet TAKE 1 TABLET(10 MG) BY MOUTH DAILY.   aspirin EC 81 MG tablet Take 1 tablet (81 mg total) by mouth daily.   citalopram (CELEXA) 10 MG tablet TAKE 1 TABLET(10 MG) BY MOUTH DAILY   gabapentin (NEURONTIN) 300 MG capsule TAKE 1 CAPSULE THREE TIMES DAILY (NERVE PAIN) AS NEEDED   levothyroxine (SYNTHROID) 112 MCG tablet Take 1 tablet (112 mcg total) by mouth daily.   lisinopril (ZESTRIL) 40 MG tablet Take 1 tablet (40 mg total) by mouth daily.   zolpidem (AMBIEN) 10 MG tablet Take 1 tablet (10 mg total) by mouth at  bedtime as needed.   No facility-administered encounter medications on file as of 09/24/2022.    Allergies (verified) Patient has no known allergies.   History: Past Medical History:  Diagnosis Date   ALLERGIC RHINITIS 11/02/2006   Allergy    Anemia 2022   been taking Iron   Arthritis    Cervical disc disease 04/15/2011   Chronic maxillary sinusitis 10/05/2008   Chronic neck pain 04/15/2011   GERD 11/02/2006   HYPERLIPIDEMIA 01/10/2007   Hyperlipidemia 01/10/2007   Qualifier: Diagnosis of  By: Jonny Ruiz MD, Len Blalock    HYPERTENSION 01/10/2007   HYPOTHYROIDISM 11/02/2006   NEPHROLITHIASIS, HX OF 01/10/2007   PMR (polymyalgia rheumatica) (HCC) 11/10/2018   SINUSITIS- ACUTE-NOS 04/03/2010   Past Surgical History:  Procedure Laterality Date   ABDOMINAL HYSTERECTOMY  04/06/1994   COLONOSCOPY     PARTIAL HYSTERECTOMY     has ovaries   s/p selective nerve root block 2002     per Dr. Otelia Sergeant to right neck   TONSILLECTOMY  04/07/1959   TONSILLECTOMY     age 71   TUBAL LIGATION     Family History  Problem Relation Age of Onset   Hypothyroidism Mother    Colon polyps Father    Hypertension Other    Diabetes Other    Colon cancer Neg Hx    Esophageal cancer Neg Hx    Rectal cancer Neg Hx    Stomach  cancer Neg Hx    Social History   Socioeconomic History   Marital status: Married    Spouse name: Not on file   Number of children: 2   Years of education: Not on file   Highest education level: Not on file  Occupational History   Occupation: ELIGIBILITY    Employer: Advertising copywriter  Tobacco Use   Smoking status: Never   Smokeless tobacco: Never  Substance and Sexual Activity   Alcohol use: No   Drug use: No   Sexual activity: Yes    Partners: Male  Other Topics Concern   Not on file  Social History Narrative   Not on file   Social Determinants of Health   Financial Resource Strain: Low Risk  (09/24/2022)   Overall Financial Resource Strain (CARDIA)    Difficulty  of Paying Living Expenses: Not hard at all  Food Insecurity: No Food Insecurity (09/24/2022)   Hunger Vital Sign    Worried About Running Out of Food in the Last Year: Never true    Ran Out of Food in the Last Year: Never true  Transportation Needs: No Transportation Needs (09/24/2022)   PRAPARE - Administrator, Civil Service (Medical): No    Lack of Transportation (Non-Medical): No  Physical Activity: Inactive (09/24/2022)   Exercise Vital Sign    Days of Exercise per Week: 0 days    Minutes of Exercise per Session: 0 min  Stress: No Stress Concern Present (09/24/2022)   [employer] of Occupational Health - Occupational Stress Questionnaire    Feeling of Stress : Not at all  Social Connections: Socially Integrated (09/24/2022)   Social Connection and Isolation Panel [NHANES]    Frequency of Communication with Friends and Family: More than three times a week    Frequency of Social Gatherings with Friends and Family: More than three times a week    Attends Religious Services: More than 4 times per year    Active Member of [employer] or Organizations: Yes    Attends Engineer, structural: More than 4 times per year    Marital Status: Married    Tobacco Counseling Counseling given: Not Answered   Clinical Intake:  Pre-visit preparation completed: No  Pain : No/denies pain     BMI - recorded: 31.16 Nutritional Status: BMI > 30  Obese Nutritional Risks: None Diabetes: No  How often do you need to have someone help you when you read instructions, pamphlets, or other written materials from your doctor or pharmacy?: 1 - Never  Interpreter Needed?: No  Information entered by :: Theresa Mulligan LPN   Activities of Daily Living    09/24/2022    3:37 PM 10/21/2021    3:58 PM  In your present state of health, do you have any difficulty performing the following activities:  Hearing? 0 0  Vision? 0 0  Difficulty concentrating or making decisions? 0 0  Walking  or climbing stairs? 0 0  Dressing or bathing? 0 0  Doing errands, shopping? 0 0  Preparing Food and eating ? N N  Using the Toilet? N N  In the past six months, have you accidently leaked urine? Y Y  Comment Wears Pads for protection.   Do you have problems with loss of bowel control? N N  Managing your Medications? N N  Managing your Finances? N N  Housekeeping or managing your Housekeeping? N N    Patient Care Team: Corwin Levins, MD as PCP -  General Davina Poke as Consulting Physician (Optometry)  Indicate any recent Medical Services you may have received from other than [facility] providers in the past year (date may be approximate).     Assessment:   This is a routine wellness examination for [place].  Hearing/Vision screen Hearing Screening - Comments:: Denies hearing difficulties   Vision Screening - Comments:: Wears rx glasses - up to date with routine eye exams with  Dr Shea Evans  Dietary issues and exercise activities discussed:     Goals Addressed               This Visit's Progress     Increase physical activity (pt-stated)         Depression Screen    09/24/2022    3:37 PM 07/23/2022    8:12 AM 05/26/2022    3:11 PM 11/18/2021    3:46 PM 10/21/2021    3:57 PM 07/17/2021    8:19 AM 07/17/2021    7:58 AM  PHQ 2/9 Scores  PHQ - 2 Score 0 0 0 0 0 0 0  PHQ- 9 Score  0 0 0   1    Fall Risk    09/24/2022    3:39 PM 07/23/2022    8:12 AM 05/26/2022    3:11 PM 11/18/2021    3:47 PM 10/21/2021    3:53 PM  Fall Risk   Falls in the past year? 0 0  0 0  Number falls in past yr: 0 0 0  0  Injury with Fall? 0 0 0  0  Risk for fall due to : No Fall Risks No Fall Risks No Fall Risks No Fall Risks No Fall Risks  Follow up Falls prevention discussed Falls evaluation completed Falls evaluation completed Falls evaluation completed Falls evaluation completed    MEDICARE RISK AT HOME:  Medicare Risk at Home - 09/24/22 1543     Any stairs in or around the home? Yes    If so,  are there any without handrails? No    Home free of loose throw rugs in walkways, pet beds, electrical cords, etc? Yes    Adequate lighting in your home to reduce risk of falls? Yes    Life alert? No    Use of a cane, walker or w/c? No    Grab bars in the bathroom? Yes    Shower chair or bench in shower? Yes    Elevated toilet seat or a handicapped toilet? Yes             TIMED UP AND GO:  Was the test performed?  No    Cognitive Function:        09/24/2022    3:40 PM 10/21/2021    4:11 PM  6CIT Screen  What Year? 0 points 0 points  What month? 0 points 0 points  What time? 0 points 0 points  Count back from 20 0 points 0 points  Months in reverse 0 points 0 points  Repeat phrase 0 points 0 points  Total Score 0 points 0 points    Immunizations Immunization History  Administered Date(s) Administered   Influenza Inj Mdck Quad Pf 01/23/2018   Influenza Split 03/13/2022   Influenza Whole 01/23/2008, 01/21/2009   Influenza,inj,Quad PF,6+ Mos 02/01/2017, 01/16/2018   Influenza-Unspecified 02/05/2015, 01/16/2018, 02/05/2019, 01/23/2021, 02/04/2021   PFIZER(Purple Top)SARS-COV-2 Vaccination 06/28/2019, 07/19/2019   PNEUMOCOCCAL CONJUGATE-20 07/17/2021   Td 02/06/2009   Tdap 07/11/2020   Unspecified SARS-COV-2 Vaccination 06/16/2019, 06/30/2019  TDAP status: Up to date  Flu Vaccine status: Up to date  Pneumococcal vaccine status: Up to date  Covid-19 vaccine status: Completed vaccines  Qualifies for Shingles Vaccine? Yes   Zostavax completed No   Shingrix Completed?: No.    Education has been provided regarding the importance of this vaccine. Patient has been advised to call insurance company to determine out of pocket expense if they have not yet received this vaccine. Advised may also receive vaccine at local pharmacy or Health Dept. Verbalized acceptance and understanding.  Screening Tests Health Maintenance  Topic Date Due   Zoster Vaccines- Shingrix (1  of 2) 12/25/2022 (Originally 07/26/2005)   DEXA SCAN  07/23/2023 (Originally 07/26/2020)   MAMMOGRAM  10/25/2022   INFLUENZA VACCINE  11/05/2022   Medicare Annual Wellness (AWV)  09/24/2023   DTaP/Tdap/Td (3 - Td or Tdap) 07/12/2030   Colonoscopy  10/12/2030   Pneumonia Vaccine 37+ Years old  Completed   Hepatitis C Screening  Completed   HPV VACCINES  Aged Out   COVID-19 Vaccine  Discontinued    Health Maintenance  There are no preventive care reminders to display for this patient.   Colorectal cancer screening: Type of screening: Colonoscopy. Completed 10/11/20. Repeat every 10 years  Mammogram status: Completed 10/24/20. Repeat every year  Bone Density status: Ordered Deferred. Pt provided with contact info and advised to call to schedule appt.  Lung Cancer Screening: (Low Dose CT Chest recommended if Age 58-80 years, 20 pack-year currently smoking OR have quit w/in 15years.) does not qualify.     Additional Screening:  Hepatitis C Screening: does qualify; Completed 06/29/17  Vision Screening: Recommended annual ophthalmology exams for early detection of glaucoma and other disorders of the eye. Is the patient up to date with their annual eye exam?  Yes  Who is the provider or what is the name of the office in which the patient attends annual eye exams? Dr Shea Evans If pt is not established with a provider, would they like to be referred to a provider to establish care? No .   Dental Screening: Recommended annual dental exams for proper oral hygiene    Community Resource Referral / Chronic Care Management  CRR required this visit?  No   CCM required this visit?  No     Plan:     I have personally reviewed and noted the following in the patient's chart:   Medical and social history Use of alcohol, tobacco or illicit drugs  Current medications and supplements including opioid prescriptions. Patient is not currently taking opioid prescriptions. Functional ability and  status Nutritional status Physical activity Advanced directives List of other physicians Hospitalizations, surgeries, and ER visits in previous 12 months Vitals Screenings to include cognitive, depression, and falls Referrals and appointments  In addition, I have reviewed and discussed with patient certain preventive protocols, quality metrics, and best practice recommendations. A written personalized care plan for preventive services as well as general preventive health recommendations were provided to patient.     Tillie Rung, LPN   [id number]   After Visit Summary: (MyChart) Due to this being a telephonic visit, the after visit summary with patients personalized plan was offered to patient via MyChart   Nurse Notes: None

## 2022-09-24 NOTE — Patient Instructions (Addendum)
[name] , Thank you for taking time to come for your Medicare Wellness Visit. I appreciate your ongoing commitment to your health goals. Please review the following plan we discussed and let me know if I can assist you in the future.   These are the goals we discussed:  Goals       Increase physical activity (pt-stated)      My goal is to increase my walking and improve my eating habits.        This is a list of the screening recommended for you and due dates:  Health Maintenance  Topic Date Due   Zoster (Shingles) Vaccine (1 of 2) 12/25/2022*   DEXA scan (bone density measurement)  07/23/2023*   Mammogram  10/25/2022   Flu Shot  11/05/2022   Medicare Annual Wellness Visit  09/24/2023   DTaP/Tdap/Td vaccine (3 - Td or Tdap) 07/12/2030   Colon Cancer Screening  10/12/2030   Pneumonia Vaccine  Completed   Hepatitis C Screening  Completed   HPV Vaccine  Aged Out   COVID-19 Vaccine  Discontinued  *Topic was postponed. The date shown is not the original due date.    Advanced directives: Advance directive discussed with you today. Even though you declined this today, please call our office should you change your mind, and we can give you the proper paperwork for you to fill out.   Conditions/risks identified: None  Next appointment: Follow up in one year for your annual wellness visit    Preventive Care 65 Years and Older, Female Preventive care refers to lifestyle choices and visits with your health care provider that can promote health and wellness. What does preventive care include? A yearly physical exam. This is also called an annual well check. Dental exams once or twice a year. Routine eye exams. Ask your health care provider how often you should have your eyes checked. Personal lifestyle choices, including: Daily care of your teeth and gums. Regular physical activity. Eating a healthy diet. Avoiding tobacco and drug use. Limiting alcohol use. Practicing safe  sex. Taking low-dose aspirin every day. Taking vitamin and mineral supplements as recommended by your health care provider. What happens during an annual well check? The services and screenings done by your health care provider during your annual well check will depend on your age, overall health, lifestyle risk factors, and family history of disease. Counseling  Your health care provider may ask you questions about your: Alcohol use. Tobacco use. Drug use. Emotional well-being. Home and relationship well-being. Sexual activity. Eating habits. History of falls. Memory and ability to understand (cognition). Work and work Astronomer. Reproductive health. Screening  You may have the following tests or measurements: Height, weight, and BMI. Blood pressure. Lipid and cholesterol levels. These may be checked every 5 years, or more frequently if you are over 52 years old. Skin check. Lung cancer screening. You may have this screening every year starting at age 3 if you have a 30-pack-year history of smoking and currently smoke or have quit within the past 15 years. Fecal occult blood test (FOBT) of the stool. You may have this test every year starting at age 65. Flexible sigmoidoscopy or colonoscopy. You may have a sigmoidoscopy every 5 years or a colonoscopy every 10 years starting at age 110. Hepatitis C blood test. Hepatitis B blood test. Sexually transmitted disease (STD) testing. Diabetes screening. This is done by checking your blood sugar (glucose) after you have not eaten for a while (fasting). You  may have this done every 1-3 years. Bone density scan. This is done to screen for osteoporosis. You may have this done starting at age 15. Mammogram. This may be done every 1-2 years. Talk to your health care provider about how often you should have regular mammograms. Talk with your health care provider about your test results, treatment options, and if necessary, the need for more  tests. Vaccines  Your health care provider may recommend certain vaccines, such as: Influenza vaccine. This is recommended every year. Tetanus, diphtheria, and acellular pertussis (Tdap, Td) vaccine. You may need a Td booster every 10 years. Zoster vaccine. You may need this after age 73. Pneumococcal 13-valent conjugate (PCV13) vaccine. One dose is recommended after age 84. Pneumococcal polysaccharide (PPSV23) vaccine. One dose is recommended after age 24. Talk to your health care provider about which screenings and vaccines you need and how often you need them. This information is not intended to replace advice given to you by your health care provider. Make sure you discuss any questions you have with your health care provider. Document Released: 04/19/2015 Document Revised: 12/11/2015 Document Reviewed: 01/22/2015 Elsevier Interactive Patient Education  2017 [place] Prevention in the Home Falls can cause injuries. They can happen to people of all ages. There are many things you can do to make your home safe and to help prevent falls. What can I do on the outside of my home? Regularly fix the edges of walkways and driveways and fix any cracks. Remove anything that might make you trip as you walk through a door, such as a raised step or threshold. Trim any bushes or trees on the path to your home. Use bright outdoor lighting. Clear any walking paths of anything that might make someone trip, such as rocks or tools. Regularly check to see if handrails are loose or broken. Make sure that both sides of any steps have handrails. Any raised decks and porches should have guardrails on the edges. Have any leaves, snow, or ice cleared regularly. Use sand or salt on walking paths during winter. Clean up any spills in your garage right away. This includes oil or grease spills. What can I do in the bathroom? Use night lights. Install grab bars by the toilet and in the tub and shower.  Do not use towel bars as grab bars. Use non-skid mats or decals in the tub or shower. If you need to sit down in the shower, use a plastic, non-slip stool. Keep the floor dry. Clean up any water that spills on the floor as soon as it happens. Remove soap buildup in the tub or shower regularly. Attach bath mats securely with double-sided non-slip rug tape. Do not have throw rugs and other things on the floor that can make you trip. What can I do in the bedroom? Use night lights. Make sure that you have a light by your bed that is easy to reach. Do not use any sheets or blankets that are too big for your bed. They should not hang down onto the floor. Have a firm chair that has side arms. You can use this for support while you get dressed. Do not have throw rugs and other things on the floor that can make you trip. What can I do in the kitchen? Clean up any spills right away. Avoid walking on wet floors. Keep items that you use a lot in easy-to-reach places. If you need to reach something above you, use a strong  step stool that has a grab bar. Keep electrical cords out of the way. Do not use floor polish or wax that makes floors slippery. If you must use wax, use non-skid floor wax. Do not have throw rugs and other things on the floor that can make you trip. What can I do with my stairs? Do not leave any items on the stairs. Make sure that there are handrails on both sides of the stairs and use them. Fix handrails that are broken or loose. Make sure that handrails are as long as the stairways. Check any carpeting to make sure that it is firmly attached to the stairs. Fix any carpet that is loose or worn. Avoid having throw rugs at the top or bottom of the stairs. If you do have throw rugs, attach them to the floor with carpet tape. Make sure that you have a light switch at the top of the stairs and the bottom of the stairs. If you do not have them, ask someone to add them for you. What else  can I do to help prevent falls? Wear shoes that: Do not have high heels. Have rubber bottoms. Are comfortable and fit you well. Are closed at the toe. Do not wear sandals. If you use a stepladder: Make sure that it is fully opened. Do not climb a closed stepladder. Make sure that both sides of the stepladder are locked into place. Ask someone to hold it for you, if possible. Clearly mark and make sure that you can see: Any grab bars or handrails. First and last steps. Where the edge of each step is. Use tools that help you move around (mobility aids) if they are needed. These include: Canes. Walkers. Scooters. Crutches. Turn on the lights when you go into a dark area. Replace any light bulbs as soon as they burn out. Set up your furniture so you have a clear path. Avoid moving your furniture around. If any of your floors are uneven, fix them. If there are any pets around you, be aware of where they are. Review your medicines with your doctor. Some medicines can make you feel dizzy. This can increase your chance of falling. Ask your doctor what other things that you can do to help prevent falls. This information is not intended to replace advice given to you by your health care provider. Make sure you discuss any questions you have with your health care provider. Document Released: 01/17/2009 Document Revised: 08/29/2015 Document Reviewed: 04/27/2014 Elsevier Interactive Patient Education  2017 [employer].

## 2022-11-12 DIAGNOSIS — Z1272 Encounter for screening for malignant neoplasm of vagina: Secondary | ICD-10-CM | POA: Diagnosis not present

## 2022-11-12 DIAGNOSIS — Z124 Encounter for screening for malignant neoplasm of cervix: Secondary | ICD-10-CM | POA: Diagnosis not present

## 2022-11-12 DIAGNOSIS — Z1231 Encounter for screening mammogram for malignant neoplasm of breast: Secondary | ICD-10-CM | POA: Diagnosis not present

## 2022-11-12 DIAGNOSIS — Z6831 Body mass index (BMI) 31.0-31.9, adult: Secondary | ICD-10-CM | POA: Diagnosis not present

## 2023-02-01 ENCOUNTER — Encounter: Payer: Self-pay | Admitting: Internal Medicine

## 2023-02-01 ENCOUNTER — Ambulatory Visit: Payer: Medicare HMO | Admitting: Internal Medicine

## 2023-02-01 VITALS — BP 126/70 | HR 73 | Temp 98.5°F | Ht 66.5 in | Wt 194.0 lb

## 2023-02-01 DIAGNOSIS — M353 Polymyalgia rheumatica: Secondary | ICD-10-CM

## 2023-02-01 DIAGNOSIS — E538 Deficiency of other specified B group vitamins: Secondary | ICD-10-CM | POA: Diagnosis not present

## 2023-02-01 DIAGNOSIS — I10 Essential (primary) hypertension: Secondary | ICD-10-CM | POA: Diagnosis not present

## 2023-02-01 DIAGNOSIS — J309 Allergic rhinitis, unspecified: Secondary | ICD-10-CM | POA: Diagnosis not present

## 2023-02-01 DIAGNOSIS — E039 Hypothyroidism, unspecified: Secondary | ICD-10-CM | POA: Diagnosis not present

## 2023-02-01 DIAGNOSIS — R739 Hyperglycemia, unspecified: Secondary | ICD-10-CM

## 2023-02-01 DIAGNOSIS — E7800 Pure hypercholesterolemia, unspecified: Secondary | ICD-10-CM | POA: Diagnosis not present

## 2023-02-01 DIAGNOSIS — E559 Vitamin D deficiency, unspecified: Secondary | ICD-10-CM | POA: Diagnosis not present

## 2023-02-01 LAB — HEMOGLOBIN A1C: Hgb A1c MFr Bld: 5.3 % (ref 4.6–6.5)

## 2023-02-01 LAB — HEPATIC FUNCTION PANEL
ALT: 16 U/L (ref 0–35)
AST: 18 U/L (ref 0–37)
Albumin: 4.6 g/dL (ref 3.5–5.2)
Alkaline Phosphatase: 108 U/L (ref 39–117)
Bilirubin, Direct: 0 mg/dL (ref 0.0–0.3)
Total Bilirubin: 0.4 mg/dL (ref 0.2–1.2)
Total Protein: 7.3 g/dL (ref 6.0–8.3)

## 2023-02-01 LAB — LIPID PANEL
Cholesterol: 179 mg/dL (ref 0–200)
HDL: 56.2 mg/dL (ref >39.00)
LDL Cholesterol: 92 mg/dL (ref 0–99)
NonHDL: 122.99
Total CHOL/HDL Ratio: 3
Triglycerides: 157 mg/dL — ABNORMAL HIGH (ref 0.0–149.0)
VLDL: 31.4 mg/dL (ref 0.0–40.0)

## 2023-02-01 LAB — BASIC METABOLIC PANEL
BUN: 17 mg/dL (ref 6–23)
CO2: 30 meq/L (ref 19–32)
Calcium: 10.2 mg/dL (ref 8.4–10.5)
Chloride: 102 meq/L (ref 96–112)
Creatinine, Ser: 0.75 mg/dL (ref 0.40–1.20)
GFR: 82.33 mL/min (ref >60.00)
Glucose, Bld: 96 mg/dL (ref 70–99)
Potassium: 4.9 meq/L (ref 3.5–5.1)
Sodium: 139 meq/L (ref 135–145)

## 2023-02-01 LAB — VITAMIN B12: Vitamin B-12: 1345 pg/mL — ABNORMAL HIGH (ref 211–911)

## 2023-02-01 LAB — VITAMIN D 25 HYDROXY (VIT D DEFICIENCY, FRACTURES): VITD: 44.17 ng/mL (ref 30.00–100.00)

## 2023-02-01 LAB — C-REACTIVE PROTEIN: CRP: 1.4 mg/dL (ref 0.5–20.0)

## 2023-02-01 LAB — SEDIMENTATION RATE: Sed Rate: 23 mm/h (ref 0–30)

## 2023-02-01 MED ORDER — METHYLPREDNISOLONE ACETATE 80 MG/ML IJ SUSP
80.0000 mg | Freq: Once | INTRAMUSCULAR | Status: AC
  Administered 2023-02-01: 80 mg via INTRAMUSCULAR

## 2023-02-01 MED ORDER — PREDNISONE 10 MG PO TABS: ORAL_TABLET | ORAL | 0 refills | Status: DC

## 2023-02-01 NOTE — Progress Notes (Signed)
The test results show that your current treatment is OK, as the tests are stable.  Please continue the same plan.  There is no other need for change of treatment or further evaluation based on these results, at this time.  thanks 

## 2023-02-01 NOTE — Patient Instructions (Addendum)
Please have your Shingrix (shingles) shots done at your local pharmacy.  You had the steroid shot today  Please take all new medication as prescribed - the prednisone  Please continue all other medications as before, and refills have been done if requested.  Please have the pharmacy call with any other refills you may need.  Please continue your efforts at being more active, low cholesterol diet, and weight control.  Please keep your appointments with your specialists as you may have planned  Please go to the LAB at the blood drawing area for the tests to be done - the thyroid testing and sed rate  You will be contacted by phone if any changes need to be made immediately.  Otherwise, you will receive a letter about your results with an explanation, but please check with MyChart first.  Please make an Appointment to return in 6 months, or sooner if needed

## 2023-02-01 NOTE — Progress Notes (Unsigned)
Patient ID: [name], female   DOB: [birth year], 68 y.o.   MRN: [id number]        Chief Complaint: follow up PMR, allergies, low thyroid with recent med change, hyperglycemia       HPI:  [name] is a 67 y.o. female here with flare it seems with 2 wks onset bilateral pain and stiffenss to both shoulders, arms and upper legs.  Has alos had some right sided neck pain but seems different and has seen Dr Coralie Keens for chronic neck DDD DJD pain.  May need [facility] again, pt plans to f/u there.  No  plans to see rheum for now, last seen about 2 yrs, now with flare of pin as above.  Does have several wks ongoing nasal allergy symptoms with clearish congestion, itch and sneezing, without fever, pain, ST, cough, swelling or wheezing.  Denies hyper or hypo thyroid symptoms such as voice, skin or hair change.  Pt denies chest pain, increased sob or doe, wheezing, orthopnea, PND, increased LE swelling, palpitations, dizziness or syncope.   Pt denies polydipsia, polyuria, or new focal neuro s/s.         Wt Readings from Last 3 Encounters:  02/01/23 194 lb (88 kg)  09/24/22 196 lb (88.9 kg)  09/09/22 196 lb (88.9 kg)   BP Readings from Last 3 Encounters:  02/01/23 126/70  09/09/22 130/74  07/23/22 124/80         Past Medical History:  Diagnosis Date   ALLERGIC RHINITIS 11/02/2006   Allergy    Anemia 2022   been taking Iron   Arthritis    Cervical disc disease 04/15/2011   Chronic maxillary sinusitis 10/05/2008   Chronic neck pain 04/15/2011   GERD 11/02/2006   HYPERLIPIDEMIA 01/10/2007   Hyperlipidemia 01/10/2007   Qualifier: Diagnosis of  By: Jonny Ruiz MD, Len Blalock    HYPERTENSION 01/10/2007   HYPOTHYROIDISM 11/02/2006   NEPHROLITHIASIS, HX OF 01/10/2007   PMR (polymyalgia rheumatica) (HCC) 11/10/2018   SINUSITIS- ACUTE-NOS 04/03/2010   Past Surgical History:  Procedure Laterality Date   ABDOMINAL HYSTERECTOMY  04/06/1994   COLONOSCOPY     PARTIAL HYSTERECTOMY     has ovaries   s/p selective  nerve root block 2002     per Dr. Otelia Sergeant to right neck   TONSILLECTOMY  04/07/1959   TONSILLECTOMY     age 89   TUBAL LIGATION      reports that she has never smoked. She has never used smokeless tobacco. She reports that she does not drink alcohol and does not use drugs. family history includes Colon polyps in her father; Diabetes in an other family member; Hypertension in an other family member; Hypothyroidism in her mother. No Known Allergies Current Outpatient Medications on File Prior to Visit  Medication Sig Dispense Refill   ALPRAZolam (XANAX) 0.5 MG tablet   2   amLODipine (NORVASC) 10 MG tablet TAKE 1 TABLET(10 MG) BY MOUTH DAILY. 90 tablet 3   aspirin EC 81 MG tablet Take 1 tablet (81 mg total) by mouth daily. 90 tablet 11   citalopram (CELEXA) 10 MG tablet TAKE 1 TABLET(10 MG) BY MOUTH DAILY 90 tablet 3   gabapentin (NEURONTIN) 300 MG capsule TAKE 1 CAPSULE THREE TIMES DAILY (NERVE PAIN) AS NEEDED 180 capsule 1   levothyroxine (SYNTHROID) 112 MCG tablet Take 1 tablet (112 mcg total) by mouth daily. 90 tablet 3   lisinopril (ZESTRIL) 40 MG tablet Take 1 tablet (40 mg total) by mouth  daily. 90 tablet 3   zolpidem (AMBIEN) 10 MG tablet Take 1 tablet (10 mg total) by mouth at bedtime as needed. 90 tablet 1   No current facility-administered medications on file prior to visit.        ROS:  All others reviewed and negative.  Objective        PE:  BP 126/70 (BP Location: Right Arm, Patient Position: Sitting, Cuff Size: Normal)   Pulse 73   Temp 98.5 F (36.9 C) (Oral)   Ht 5' 6.5" (1.689 m)   Wt 194 lb (88 kg)   SpO2 98%   BMI 30.84 kg/m                 Constitutional: Pt appears in NAD               HENT: Head: NCAT.                Right Ear: External ear normal.                 Left Ear: External ear normal.                Eyes: . Pupils are equal, round, and reactive to light. Conjunctivae and EOM are normal               Nose: without d/c or deformity                Neck: Neck supple. Gross normal ROM               Cardiovascular: Normal rate and regular rhythm.                 Pulmonary/Chest: Effort normal and breath sounds without rales or wheezing.                Abd:  Soft, NT, ND, + BS, no organomegaly               Neurological: Pt is alert. At baseline orientation, motor grossly intact               Skin: Skin is warm. No rashes, no other new lesions, LE edema - none               Psychiatric: Pt behavior is normal without agitation   Micro: none  Cardiac tracings I have personally interpreted today:  none  Pertinent Radiological findings (summarize): none   Lab Results  Component Value Date   WBC 6.6 07/23/2022   HGB 13.2 07/23/2022   HCT 39.5 07/23/2022   PLT 225.0 07/23/2022   GLUCOSE 96 02/01/2023   CHOL 179 02/01/2023   TRIG 157.0 (H) 02/01/2023   HDL 56.20 02/01/2023   LDLDIRECT 109.0 07/11/2020   LDLCALC 92 02/01/2023   ALT 16 02/01/2023   AST 18 02/01/2023   NA 139 02/01/2023   K 4.9 02/01/2023   CL 102 02/01/2023   CREATININE 0.75 02/01/2023   BUN 17 02/01/2023   CO2 30 02/01/2023   TSH 0.45 02/01/2023   HGBA1C 5.3 02/01/2023   Assessment/Plan:  [name] is a 67 y.o. White or Caucasian [1] female with  has a past medical history of ALLERGIC RHINITIS (11/02/2006), Allergy, Anemia (2022), Arthritis, Cervical disc disease (04/15/2011), Chronic maxillary sinusitis (10/05/2008), Chronic neck pain (04/15/2011), GERD (11/02/2006), HYPERLIPIDEMIA (01/10/2007), Hyperlipidemia (01/10/2007), HYPERTENSION (01/10/2007), HYPOTHYROIDISM (11/02/2006), NEPHROLITHIASIS, HX OF (01/10/2007), PMR (polymyalgia rheumatica) (HCC) (11/10/2018), and SINUSITIS- ACUTE-NOS (04/03/2010).  PMR (polymyalgia rheumatica) (  HCC) With recent onset clinical flare, for depomedrol IM 80 mg now, prednisone taper, f/u rheum for persistent or frequent worsening  Vitamin D deficiency Last vitamin D Lab Results  Component Value Date   VD25OH 44.17  02/01/2023   Stable, cont oral replacement   Vitamin B12 deficiency Lab Results  Component Value Date   VITAMINB12 1,345 (H) 02/01/2023   Stable, cont oral replacement - b12 1000 mcg qd   Hypothyroidism With recent med change to 112 mcg per day - for f/u TFTs  Hyperlipidemia Lab Results  Component Value Date   LDLCALC 92 02/01/2023   Stable, pt to continue low chol diet   Hyperglycemia Lab Results  Component Value Date   HGBA1C 5.3 02/01/2023   Stable, pt to continue current medical treatment  - diet, wt control  Essential hypertension BP Readings from Last 3 Encounters:  02/01/23 126/70  09/09/22 130/74  07/23/22 124/80   Stable, pt to continue medical treatment norvasc 10 every day, lisinopril 40 qd   Allergic rhinitis With mild recent worsening, also to improved with depomedrol and prednisone taper  Followup: Return in about 6 months (around 08/02/2023).  Oliver Barre, MD 02/04/2023 2:41 PM [facility] - [facility]

## 2023-02-02 LAB — THYROID PANEL WITH TSH
Free Thyroxine Index: 2.6 (ref 1.4–3.8)
T3 Uptake: 31 % (ref 22–35)
T4, Total: 8.3 ug/dL (ref 5.1–11.9)
TSH: 0.45 m[IU]/L (ref 0.40–4.50)

## 2023-02-04 ENCOUNTER — Encounter: Payer: Self-pay | Admitting: Internal Medicine

## 2023-02-04 NOTE — Assessment & Plan Note (Signed)
With mild recent worsening, also to improved with depomedrol and prednisone taper

## 2023-02-04 NOTE — Assessment & Plan Note (Signed)
Last vitamin D Lab Results  Component Value Date   VD25OH 44.17 02/01/2023   Stable, cont oral replacement

## 2023-02-04 NOTE — Assessment & Plan Note (Signed)
With recent onset clinical flare, for depomedrol IM 80 mg now, prednisone taper, f/u rheum for persistent or frequent worsening

## 2023-02-04 NOTE — Assessment & Plan Note (Signed)
BP Readings from Last 3 Encounters:  02/01/23 126/70  09/09/22 130/74  07/23/22 124/80   Stable, pt to continue medical treatment norvasc 10 every day, lisinopril 40 qd

## 2023-02-04 NOTE — Assessment & Plan Note (Signed)
Lab Results  Component Value Date   VITAMINB12 1,345 (H) 02/01/2023   Stable, cont oral replacement - b12 1000 mcg qd

## 2023-02-04 NOTE — Assessment & Plan Note (Signed)
With recent med change to 112 mcg per day - for f/u TFTs

## 2023-02-04 NOTE — Assessment & Plan Note (Signed)
Lab Results  Component Value Date   HGBA1C 5.3 02/01/2023   Stable, pt to continue current medical treatment  - diet, wt control

## 2023-02-04 NOTE — Assessment & Plan Note (Signed)
Lab Results  Component Value Date   LDLCALC 92 02/01/2023   Stable, pt to continue low chol diet

## 2023-02-09 ENCOUNTER — Other Ambulatory Visit: Payer: Self-pay | Admitting: Internal Medicine

## 2023-06-09 ENCOUNTER — Other Ambulatory Visit: Payer: Self-pay | Admitting: Internal Medicine

## 2023-06-09 ENCOUNTER — Other Ambulatory Visit: Payer: Self-pay

## 2023-06-09 DIAGNOSIS — M5416 Radiculopathy, lumbar region: Secondary | ICD-10-CM

## 2023-07-08 ENCOUNTER — Encounter: Payer: Self-pay | Admitting: Internal Medicine

## 2023-07-08 ENCOUNTER — Ambulatory Visit (INDEPENDENT_AMBULATORY_CARE_PROVIDER_SITE_OTHER): Admitting: Internal Medicine

## 2023-07-08 VITALS — BP 124/74 | HR 67 | Temp 98.1°F | Ht 66.5 in | Wt 190.0 lb

## 2023-07-08 DIAGNOSIS — R739 Hyperglycemia, unspecified: Secondary | ICD-10-CM

## 2023-07-08 DIAGNOSIS — I10 Essential (primary) hypertension: Secondary | ICD-10-CM | POA: Diagnosis not present

## 2023-07-08 DIAGNOSIS — E538 Deficiency of other specified B group vitamins: Secondary | ICD-10-CM | POA: Diagnosis not present

## 2023-07-08 DIAGNOSIS — M7651 Patellar tendinitis, right knee: Secondary | ICD-10-CM

## 2023-07-08 DIAGNOSIS — E559 Vitamin D deficiency, unspecified: Secondary | ICD-10-CM | POA: Diagnosis not present

## 2023-07-08 MED ORDER — MELOXICAM 15 MG PO TABS: 15.0000 mg | ORAL_TABLET | Freq: Every day | ORAL | 1 refills | Status: AC | PRN

## 2023-07-08 NOTE — Progress Notes (Signed)
 Patient ID: [name], female   DOB: 1955-04-22, 68 y.o.   MRN: [id number]        Chief Complaint: follow up right knee pain, htn, low b12 and D       HPI:  [name] is a 68 y.o. female here with c/o 3 wks onset painful swelling sore tender to tendon below the right kneecap,  Worse after bowling,  Pt denies chest pain, increased sob or doe, wheezing, orthopnea, PND, increased LE swelling, palpitations, dizziness or syncope.   Pt denies polydipsia, polyuria, or new focal neuro s/s.   Marland Kitchen Pt denies fever, wt loss, night sweats, loss of appetite, or other constitutional symptoms         Wt Readings from Last 3 Encounters:  07/08/23 190 lb (86.2 kg)  02/01/23 194 lb (88 kg)  09/24/22 196 lb (88.9 kg)   BP Readings from Last 3 Encounters:  07/08/23 124/74  02/01/23 126/70  09/09/22 130/74         Past Medical History:  Diagnosis Date   ALLERGIC RHINITIS 11/02/2006   Allergy    Anemia 2022   been taking Iron   Arthritis    Cervical disc disease 04/15/2011   Chronic maxillary sinusitis 10/05/2008   Chronic neck pain 04/15/2011   GERD 11/02/2006   HYPERLIPIDEMIA 01/10/2007   Hyperlipidemia 01/10/2007   Qualifier: Diagnosis of  By: Jonny Ruiz MD, Len Blalock    HYPERTENSION 01/10/2007   HYPOTHYROIDISM 11/02/2006   NEPHROLITHIASIS, HX OF 01/10/2007   PMR (polymyalgia rheumatica) (HCC) 11/10/2018   SINUSITIS- ACUTE-NOS 04/03/2010   Past Surgical History:  Procedure Laterality Date   ABDOMINAL HYSTERECTOMY  04/06/1994   COLONOSCOPY     PARTIAL HYSTERECTOMY     has ovaries   s/p selective nerve root block 2002     per Dr. Otelia Sergeant to right neck   TONSILLECTOMY  04/07/1959   TONSILLECTOMY     age 89   TUBAL LIGATION      reports that she has never smoked. She has never used smokeless tobacco. She reports that she does not drink alcohol and does not use drugs. family history includes Colon polyps in her father; Diabetes in an other family member; Hypertension in an other family member;  Hypothyroidism in her mother. No Known Allergies Current Outpatient Medications on File Prior to Visit  Medication Sig Dispense Refill   ALPRAZolam (XANAX) 0.5 MG tablet   2   amLODipine (NORVASC) 10 MG tablet TAKE 1 TABLET(10 MG) BY MOUTH DAILY. 90 tablet 3   aspirin EC 81 MG tablet Take 1 tablet (81 mg total) by mouth daily. 90 tablet 11   citalopram (CELEXA) 10 MG tablet TAKE 1 TABLET(10 MG) BY MOUTH DAILY 90 tablet 3   gabapentin (NEURONTIN) 300 MG capsule TAKE 1 CAPSULE BY MOUTH THREE TIMES DAILY AS NEEDED FOR NERVE PAIN 180 capsule 1   levothyroxine (SYNTHROID) 112 MCG tablet Take 1 tablet (112 mcg total) by mouth daily. 90 tablet 3   lisinopril (ZESTRIL) 40 MG tablet Take 1 tablet (40 mg total) by mouth daily. 90 tablet 3   zolpidem (AMBIEN) 10 MG tablet TAKE 1 TABLET BY MOUTH EVERY NIGHT AT BEDTIME AS NEEDED 90 tablet 1   No current facility-administered medications on file prior to visit.        ROS:  All others reviewed and negative.  Objective        PE:  BP 124/74 (BP Location: Left Arm, Patient Position: Sitting, Cuff Size:  Normal)   Pulse 67   Temp 98.1 F (36.7 C) (Oral)   Ht 5' 6.5" (1.689 m)   Wt 190 lb (86.2 kg)   SpO2 97%   BMI 30.21 kg/m                 Constitutional: Pt appears in NAD               HENT: Head: NCAT.                Right Ear: External ear normal.                 Left Ear: External ear normal.                Eyes: . Pupils are equal, round, and reactive to light. Conjunctivae and EOM are normal               Nose: without d/c or deformity               Neck: Neck supple. Gross normal ROM               Cardiovascular: Normal rate and regular rhythm.                 Pulmonary/Chest: Effort normal and breath sounds without rales or wheezing.                Abd:  Soft, NT, ND, + BS, no organomegaly               Neurological: Pt is alert. At baseline orientation, motor grossly intact               Skin: Skin is warm. No rashes, no other new  lesions, LE edema - none                Right knee with mild swelling tender patellar tendon and insertion site               Psychiatric: Pt behavior is normal without agitation   Micro: none  Cardiac tracings I have personally interpreted today:  none  Pertinent Radiological findings (summarize): none   Lab Results  Component Value Date   WBC 6.6 07/23/2022   HGB 13.2 07/23/2022   HCT 39.5 07/23/2022   PLT 225.0 07/23/2022   GLUCOSE 96 02/01/2023   CHOL 179 02/01/2023   TRIG 157.0 (H) 02/01/2023   HDL 56.20 02/01/2023   LDLDIRECT 109.0 07/11/2020   LDLCALC 92 02/01/2023   ALT 16 02/01/2023   AST 18 02/01/2023   NA 139 02/01/2023   K 4.9 02/01/2023   CL 102 02/01/2023   CREATININE 0.75 02/01/2023   BUN 17 02/01/2023   CO2 30 02/01/2023   TSH 0.45 02/01/2023   HGBA1C 5.3 02/01/2023   Assessment/Plan:  [name] is a 68 y.o. White or Caucasian [1] female with  has a past medical history of ALLERGIC RHINITIS (11/02/2006), Allergy, Anemia (2022), Arthritis, Cervical disc disease (04/15/2011), Chronic maxillary sinusitis (10/05/2008), Chronic neck pain (04/15/2011), GERD (11/02/2006), HYPERLIPIDEMIA (01/10/2007), Hyperlipidemia (01/10/2007), HYPERTENSION (01/10/2007), HYPOTHYROIDISM (11/02/2006), NEPHROLITHIASIS, HX OF (01/10/2007), PMR (polymyalgia rheumatica) (HCC) (11/10/2018), and SINUSITIS- ACUTE-NOS (04/03/2010).  Essential hypertension BP Readings from Last 3 Encounters:  07/08/23 124/74  02/01/23 126/70  09/09/22 130/74   Stable, pt to continue medical treatment norvasc 10 every day, lisinopril 40 qd   Hyperglycemia Lab Results  Component Value Date   HGBA1C 5.3 02/01/2023   Stable, pt  to continue current medical treatment  - diet, wt control   Vitamin D deficiency Last vitamin D Lab Results  Component Value Date   VD25OH 44.17 02/01/2023   Stable, cont oral replacement   Vitamin B12 deficiency Lab Results  Component Value Date   VITAMINB12  1,345 (H) 02/01/2023   Stable, cont oral replacement - b12 1000 mcg qd   Patellar tendonitis of right knee Mild to mod, for mobic 15 mg prn, rest, and f/u with sports medicine as alreaady has appt,,  to f/u any worsening symptoms or concerns  Followup: Return if symptoms worsen or fail to improve.  Oliver Barre, MD 07/10/2023 9:16 PM [facility] [place] [facility] - [facility]

## 2023-07-08 NOTE — Patient Instructions (Signed)
 Please take all new medication as prescribed - the anti-inflammatory  Please continue all other medications as before, and refills have been done if requested.  Please have the pharmacy call with any other refills you may need.  Please continue your efforts at being more active, low cholesterol diet, and weight control.  Please keep your appointments with your specialists as you may have planned   - sports medicine later this month

## 2023-07-10 ENCOUNTER — Encounter: Payer: Self-pay | Admitting: Internal Medicine

## 2023-07-10 DIAGNOSIS — M7651 Patellar tendinitis, right knee: Secondary | ICD-10-CM | POA: Insufficient documentation

## 2023-07-10 NOTE — Assessment & Plan Note (Signed)
 BP Readings from Last 3 Encounters:  07/08/23 124/74  02/01/23 126/70  09/09/22 130/74   Stable, pt to continue medical treatment norvasc 10 every day, lisinopril 40 qd

## 2023-07-10 NOTE — Assessment & Plan Note (Signed)
 Mild to mod, for mobic 15 mg prn, rest, and f/u with sports medicine as alreaady has appt,,  to f/u any worsening symptoms or concerns

## 2023-07-10 NOTE — Assessment & Plan Note (Signed)
 Last vitamin D Lab Results  Component Value Date   VD25OH 44.17 02/01/2023   Stable, cont oral replacement

## 2023-07-10 NOTE — Assessment & Plan Note (Signed)
 Lab Results  Component Value Date   HGBA1C 5.3 02/01/2023   Stable, pt to continue current medical treatment  - diet, wt control

## 2023-07-10 NOTE — Assessment & Plan Note (Signed)
 Lab Results  Component Value Date   VITAMINB12 1,345 (H) 02/01/2023   Stable, cont oral replacement - b12 1000 mcg qd

## 2023-07-29 ENCOUNTER — Encounter: Payer: Self-pay | Admitting: Internal Medicine

## 2023-07-29 ENCOUNTER — Ambulatory Visit (INDEPENDENT_AMBULATORY_CARE_PROVIDER_SITE_OTHER): Payer: Medicare HMO | Admitting: Internal Medicine

## 2023-07-29 VITALS — BP 120/72 | HR 65 | Temp 98.7°F | Ht 66.5 in | Wt 191.0 lb

## 2023-07-29 DIAGNOSIS — I10 Essential (primary) hypertension: Secondary | ICD-10-CM | POA: Diagnosis not present

## 2023-07-29 DIAGNOSIS — M25561 Pain in right knee: Secondary | ICD-10-CM

## 2023-07-29 DIAGNOSIS — M722 Plantar fascial fibromatosis: Secondary | ICD-10-CM | POA: Diagnosis not present

## 2023-07-29 DIAGNOSIS — Z0000 Encounter for general adult medical examination without abnormal findings: Secondary | ICD-10-CM

## 2023-07-29 DIAGNOSIS — E538 Deficiency of other specified B group vitamins: Secondary | ICD-10-CM

## 2023-07-29 DIAGNOSIS — E7800 Pure hypercholesterolemia, unspecified: Secondary | ICD-10-CM

## 2023-07-29 DIAGNOSIS — M5416 Radiculopathy, lumbar region: Secondary | ICD-10-CM | POA: Diagnosis not present

## 2023-07-29 DIAGNOSIS — E559 Vitamin D deficiency, unspecified: Secondary | ICD-10-CM

## 2023-07-29 DIAGNOSIS — Z0001 Encounter for general adult medical examination with abnormal findings: Secondary | ICD-10-CM

## 2023-07-29 MED ORDER — CITALOPRAM HYDROBROMIDE 10 MG PO TABS: ORAL_TABLET | ORAL | 3 refills | Status: AC

## 2023-07-29 MED ORDER — LEVOTHYROXINE SODIUM 112 MCG PO TABS: 112.0000 ug | ORAL_TABLET | Freq: Every day | ORAL | 3 refills | Status: AC

## 2023-07-29 MED ORDER — LISINOPRIL 40 MG PO TABS: 40.0000 mg | ORAL_TABLET | Freq: Every day | ORAL | 3 refills | Status: AC

## 2023-07-29 MED ORDER — AMLODIPINE BESYLATE 10 MG PO TABS: ORAL_TABLET | ORAL | 3 refills | Status: AC

## 2023-07-29 MED ORDER — ZOLPIDEM TARTRATE 10 MG PO TABS: 10.0000 mg | ORAL_TABLET | Freq: Every evening | ORAL | 1 refills | Status: DC | PRN

## 2023-07-29 MED ORDER — GABAPENTIN 300 MG PO CAPS
ORAL_CAPSULE | ORAL | 1 refills | Status: DC
Start: 2023-07-29 — End: 2023-10-05

## 2023-07-29 NOTE — Progress Notes (Signed)
 Patient ID: [name], female   DOB: 11-21-1955, 68 y.o.   MRN: [id number]         Chief Complaint:: wellness exam and Annual Exam (, having trouble with right knee and hip )  And bilateral plantar fasciitis,  htm, hld, low vit d and b12       HPI:  [name] is a 68 y.o. female here for wellness exam; for shingrix at pharmacy, declines DXA for now, o/w up to date                        Also Pt denies chest pain, increased sob or doe, wheezing, orthopnea, PND, increased LE swelling, palpitations, dizziness or syncope.   Pt denies polydipsia, polyuria, or new focal neuro s/s.    Pt denies fever, wt loss, night sweats, loss of appetite, or other constitutional symptoms  Has worsening right knee patellar pain since last visit, as well as bilateral plantar feet pain with walking.     Wt Readings from Last 3 Encounters:  07/29/23 191 lb (86.6 kg)  07/08/23 190 lb (86.2 kg)  02/01/23 194 lb (88 kg)   BP Readings from Last 3 Encounters:  07/29/23 120/72  07/08/23 124/74  02/01/23 126/70   Immunization History  Administered Date(s) Administered   Influenza Inj Mdck Quad Pf 01/23/2018   Influenza Split 03/13/2022   Influenza Whole 01/23/2008, 01/21/2009   Influenza,inj,Quad PF,6+ Mos 02/01/2017, 01/16/2018   Influenza-Unspecified 02/05/2015, 01/16/2018, 02/05/2019, 01/23/2021, 02/04/2021   PFIZER(Purple Top)SARS-COV-2 Vaccination 06/28/2019, 07/19/2019   PNEUMOCOCCAL CONJUGATE-20 07/17/2021   Td 02/06/2009   Tdap 07/11/2020   Unspecified SARS-COV-2 Vaccination 06/16/2019, 06/30/2019   Health Maintenance Due  Topic Date Due   Zoster Vaccines- Shingrix (1 of 2) Never done   DEXA SCAN  Never done   Medicare Annual Wellness (AWV)  09/24/2023      Past Medical History:  Diagnosis Date   ALLERGIC RHINITIS 11/02/2006   Allergy    Anemia 2022   been taking Iron    Arthritis    Cervical disc disease 04/15/2011   Chronic maxillary sinusitis 10/05/2008   Chronic neck pain  04/15/2011   GERD 11/02/2006   HYPERLIPIDEMIA 01/10/2007   Hyperlipidemia 01/10/2007   Qualifier: Diagnosis of  By: Autry Legions MD, Alveda Aures    HYPERTENSION 01/10/2007   HYPOTHYROIDISM 11/02/2006   NEPHROLITHIASIS, HX OF 01/10/2007   PMR (polymyalgia rheumatica) (HCC) 11/10/2018   SINUSITIS- ACUTE-NOS 04/03/2010   Past Surgical History:  Procedure Laterality Date   ABDOMINAL HYSTERECTOMY  04/06/1994   COLONOSCOPY     PARTIAL HYSTERECTOMY     has ovaries   s/p selective nerve root block 2002     per Dr. Richardo Chandler to right neck   TONSILLECTOMY  04/07/1959   TONSILLECTOMY     age 1   TUBAL LIGATION      reports that she has never smoked. She has never used smokeless tobacco. She reports that she does not drink alcohol and does not use drugs. family history includes Colon polyps in her father; Diabetes in an other family member; Hypertension in an other family member; Hypothyroidism in her mother. No Known Allergies Current Outpatient Medications on File Prior to Visit  Medication Sig Dispense Refill   ALPRAZolam (XANAX) 0.5 MG tablet   2   aspirin  EC 81 MG tablet Take 1 tablet (81 mg total) by mouth daily. 90 tablet 11   meloxicam  (MOBIC ) 15 MG tablet Take 1 tablet (15  mg total) by mouth daily as needed. 90 tablet 1   No current facility-administered medications on file prior to visit.        ROS:  All others reviewed and negative.  Objective        PE:  BP 120/72 (BP Location: Right Arm, Patient Position: Sitting, Cuff Size: Normal)   Pulse 65   Temp 98.7 F (37.1 C) (Oral)   Ht 5' 6.5" (1.689 m)   Wt 191 lb (86.6 kg)   SpO2 98%   BMI 30.37 kg/m                 Constitutional: Pt appears in NAD               HENT: Head: NCAT.                Right Ear: External ear normal.                 Left Ear: External ear normal.                Eyes: . Pupils are equal, round, and reactive to light. Conjunctivae and EOM are normal               Nose: without d/c or deformity                Neck: Neck supple. Gross normal ROM               Cardiovascular: Normal rate and regular rhythm.                 Pulmonary/Chest: Effort normal and breath sounds without rales or wheezing.                Abd:  Soft, NT, ND, + BS, no organomegaly               Neurological: Pt is alert. At baseline orientation, motor grossly intact               Skin: Skin is warm. No rashes, no other new lesions, LE edema - none               Right knee with tender patellar tendon with mild swelling               Psychiatric: Pt behavior is normal without agitation   Micro: none  Cardiac tracings I have personally interpreted today:  none  Pertinent Radiological findings (summarize): none   Lab Results  Component Value Date   WBC 6.6 07/23/2022   HGB 13.2 07/23/2022   HCT 39.5 07/23/2022   PLT 225.0 07/23/2022   GLUCOSE 96 02/01/2023   CHOL 179 02/01/2023   TRIG 157.0 (H) 02/01/2023   HDL 56.20 02/01/2023   LDLDIRECT 109.0 07/11/2020   LDLCALC 92 02/01/2023   ALT 16 02/01/2023   AST 18 02/01/2023   NA 139 02/01/2023   K 4.9 02/01/2023   CL 102 02/01/2023   CREATININE 0.75 02/01/2023   BUN 17 02/01/2023   CO2 30 02/01/2023   TSH 0.45 02/01/2023   HGBA1C 5.3 02/01/2023   Assessment/Plan:  [name] is a 68 y.o. White or Caucasian [1] female with  has a past medical history of ALLERGIC RHINITIS (11/02/2006), Allergy, Anemia (2022), Arthritis, Cervical disc disease (04/15/2011), Chronic maxillary sinusitis (10/05/2008), Chronic neck pain (04/15/2011), GERD (11/02/2006), HYPERLIPIDEMIA (01/10/2007), Hyperlipidemia (01/10/2007), HYPERTENSION (01/10/2007), HYPOTHYROIDISM (11/02/2006), NEPHROLITHIASIS, HX OF (01/10/2007), PMR (polymyalgia rheumatica) (HCC) (11/10/2018), and  SINUSITIS- ACUTE-NOS (04/03/2010).  Encounter for well adult exam with abnormal findings Age and sex appropriate education and counseling updated with regular exercise and diet Referrals for preventative services - none  needed Immunizations addressed - for shingrix at pharmacy Smoking counseling  - none needed Evidence for depression or other mood disorder - stable anxiety depression Most recent labs reviewed. I have personally reviewed and have noted: 1) the patient's medical and social history 2) The patient's current medications and supplements 3) The patient's height, weight, and BMI have been recorded in the chart   Hyperlipidemia Lab Results  Component Value Date   LDLCALC 92 02/01/2023   Stable, pt to continue current low chol diet   Essential hypertension BP Readings from Last 3 Encounters:  07/29/23 120/72  07/08/23 124/74  02/01/23 126/70   Stable, pt to continue medical treatment norvasc  10 every day, lisnopril 40 mg qd   Vitamin D  deficiency Last vitamin D  Lab Results  Component Value Date   VD25OH 44.17 02/01/2023   Stable, cont oral replacement   Vitamin B12 deficiency Lab Results  Component Value Date   VITAMINB12 1,345 (H) 02/01/2023   Stable, cont oral replacement - b12 1000 mcg qd   Right knee pain With worsening possible patellar tendonitis - for refer sport medicine  Bilateral plantar fasciitis Mild worsening, also for f/u sport medicine  Followup: Return in about 6 months (around 01/28/2024).  Rosalia Colonel, MD 07/31/2023 6:35 PM [facility] [place] [facility] - [facility]

## 2023-07-29 NOTE — Patient Instructions (Addendum)
 Please have your Shingrix (shingles) shots done at your local pharmacy.  Please continue all other medications as before, and refills have been done if requested.  Please have the pharmacy call with any other refills you may need.  Please continue your efforts at being more active, low cholesterol diet, and weight control.  You are otherwise up to date with prevention measures today.  Please keep your appointments with your specialists as you may have planned  You will be contacted regarding the referral for: Dr [name]  We can hold on lab testing today  Please make an Appointment to return in 6 months, or sooner if needed

## 2023-07-31 ENCOUNTER — Encounter: Payer: Self-pay | Admitting: Internal Medicine

## 2023-07-31 NOTE — Assessment & Plan Note (Signed)
 Last vitamin D Lab Results  Component Value Date   VD25OH 44.17 02/01/2023   Stable, cont oral replacement

## 2023-07-31 NOTE — Assessment & Plan Note (Signed)
 Mild worsening, also for f/u sport medicine

## 2023-07-31 NOTE — Assessment & Plan Note (Signed)
 Lab Results  Component Value Date   VITAMINB12 1,345 (H) 02/01/2023   Stable, cont oral replacement - b12 1000 mcg qd

## 2023-07-31 NOTE — Assessment & Plan Note (Signed)
 Age and sex appropriate education and counseling updated with regular exercise and diet Referrals for preventative services - none needed Immunizations addressed - for shingrix at pharmacy Smoking counseling  - none needed Evidence for depression or other mood disorder - stable anxiety depression Most recent labs reviewed. I have personally reviewed and have noted: 1) the patient's medical and social history 2) The patient's current medications and supplements 3) The patient's height, weight, and BMI have been recorded in the chart

## 2023-07-31 NOTE — Assessment & Plan Note (Signed)
 BP Readings from Last 3 Encounters:  07/29/23 120/72  07/08/23 124/74  02/01/23 126/70   Stable, pt to continue medical treatment norvasc  10 every day, lisnopril 40 mg qd

## 2023-07-31 NOTE — Assessment & Plan Note (Signed)
 Lab Results  Component Value Date   LDLCALC 92 02/01/2023   Stable, pt to continue current low chol diet

## 2023-07-31 NOTE — Assessment & Plan Note (Signed)
 With worsening possible patellar tendonitis - for refer sport medicine

## 2023-08-03 ENCOUNTER — Other Ambulatory Visit: Payer: Self-pay

## 2023-08-03 ENCOUNTER — Ambulatory Visit (INDEPENDENT_AMBULATORY_CARE_PROVIDER_SITE_OTHER)

## 2023-08-03 ENCOUNTER — Ambulatory Visit: Admitting: Family Medicine

## 2023-08-03 ENCOUNTER — Encounter: Payer: Self-pay | Admitting: Family Medicine

## 2023-08-03 VITALS — BP 112/70 | HR 77 | Ht 66.5 in | Wt 192.0 lb

## 2023-08-03 DIAGNOSIS — G8929 Other chronic pain: Secondary | ICD-10-CM

## 2023-08-03 DIAGNOSIS — M7741 Metatarsalgia, right foot: Secondary | ICD-10-CM | POA: Diagnosis not present

## 2023-08-03 DIAGNOSIS — M25561 Pain in right knee: Secondary | ICD-10-CM | POA: Diagnosis not present

## 2023-08-03 DIAGNOSIS — M7742 Metatarsalgia, left foot: Secondary | ICD-10-CM | POA: Diagnosis not present

## 2023-08-03 DIAGNOSIS — M25551 Pain in right hip: Secondary | ICD-10-CM

## 2023-08-03 DIAGNOSIS — M25461 Effusion, right knee: Secondary | ICD-10-CM | POA: Diagnosis not present

## 2023-08-03 NOTE — Progress Notes (Signed)
 I, [name], CMA acting as a scribe for Savannah Juniper, MD.  [name] is a 68 y.o. female who presents to [facility] at [facility] today for R knee and bilat foot pain. Pt was previously seen by Dr. [name] in 2021 for lumbar radiculopathy and bilat hip pain.  Today, pt c/o R knee pain ongoing since mid-March. Pt locates pain just distal to patella initially, treated with NSAIDs, provided some relief with out of town at [employer]. Notes pain returning when back from vacation, not more at the medial aspect of the knee. Notes intermittent swelling. The knee has felt warm to the touch at times. Occasionally pain will radiate into the lower leg. Also having pain at anterior aspect of the hip, starting about 1-2 weeks after knee pain started. Notes stiffness in the knee but denies mechanical sx. Sleeps on her back d/t hip pain. No relief with Gabapentin   R Knee swelling: intermittently Mechanical symptoms: denies Aggravates: flexion, extension Treatments tried: NSAID, compression, Gabapentin   Pt also c/o bilat foot pain x several years, most recent flare up starting over the past couple of days, burning sensatio. Pt locates pain to the ball of the foot radiating into the toes.   Pertinent review of systems: No fevers or chills  Relevant historical information: Hypertension and hypothyroidism.   Exam:  BP 112/70   Pulse 77   Ht 5' 6.5" (1.689 m)   Wt 192 lb (87.1 kg)   SpO2 97%   BMI 30.53 kg/m  General: Well Developed, well nourished, and in no acute distress.   MSK: Right hip: Normal appearing  Decreased range of motion pain with flexion and internal rotation.  Right knee mild effusion otherwise normal appearing Normal motion. Intact strength   Feet bilaterally tall longitudinal arch.  Collapse of transverse arch.  Callus formation at plantar metatarsal heads 2 3 and 4 bilaterally.  Tender palpation mildly on these regions as well.    Lab and Radiology  Results  Procedure: Real-time Ultrasound Guided Injection of right knee joint superior lateral patella space Device: Philips Affiniti 50G/GE Logiq Images permanently stored and available for review in PACS Verbal informed consent obtained.  Discussed risks and benefits of procedure. Warned about infection, bleeding, hyperglycemia damage to structures among others. Patient expresses understanding and agreement Time-out conducted.   Noted no overlying erythema, induration, or other signs of local infection.   Skin prepped in a sterile fashion.   Local anesthesia: Topical Ethyl chloride.   With sterile technique and under real time ultrasound guidance: 40 mg of Kenalog and 2 mL of Marcaine injected into knee joint. Fluid seen entering the joint capsule.   Completed without difficulty   Pain immediately resolved suggesting accurate placement of the medication.   Advised to call if fevers/chills, erythema, induration, drainage, or persistent bleeding.   Images permanently stored and available for review in the ultrasound unit.  Impression: Technically successful ultrasound guided injection.   X-ray images right hip and right knee obtained today personally and independently interpreted.  Right hip: No significant osteoarthritis.  Tiny spur formation on the superior acetabulum could indicate impingement or labrum degeneration.  No acute fractures are visible.  Right knee: Mild lateral DJD.  No acute fractures.  Await formal radiology review  Assessment and Plan: 68 y.o. female with right hip pain right knee pain and bilateral plantar foot pain.  Knee pain due to DJD and likely degenerative meniscus tear.  Plan for steroid injection today.  Recheck in about a month.  Bilateral plantar foot pain due to metatarsalgia.  Plan for metatarsal pads and recheck in about 1 month.  Right anterior hip pain etiology is less clear.  She does not have much arthritis on x-ray.  Labrum pathology or hip  flexor pathology is definitely a possibility.  Plan for recheck in about a month.   PDMP not reviewed this encounter. Orders Placed This Encounter  Procedures   US  LIMITED JOINT SPACE STRUCTURES LOW RIGHT(NO LINKED CHARGES)    Reason for Exam (SYMPTOM  OR DIAGNOSIS REQUIRED):   right knee pain    Preferred imaging location?:   [facility] Knee AP/LAT W/Sunrise Right    Standing Status:   Future    Expiration Date:   09/02/2023    Reason for Exam (SYMPTOM  OR DIAGNOSIS REQUIRED):   right knee pain    Preferred imaging location?:   [facility]   DG HIP UNILAT W OR W/O PELVIS 2-3 VIEWS RIGHT    Standing Status:   Future    Expiration Date:   09/02/2023    Reason for Exam (SYMPTOM  OR DIAGNOSIS REQUIRED):   right hip pain    Preferred imaging location?:   [facility]   No orders of the defined types were placed in this encounter.    Discussed warning signs or symptoms. Please see discharge instructions. Patient expresses understanding.   The above documentation has been reviewed and is accurate and complete [name], M.D.

## 2023-08-03 NOTE — Patient Instructions (Addendum)
 Thank you for coming in today.   Please get an Xray today before you leave   Met Pad  See you back in 1 month

## 2023-08-04 NOTE — Progress Notes (Signed)
Right hip x-ray shows a little bit of arthritis.

## 2023-08-04 NOTE — Progress Notes (Signed)
Right knee x-ray shows a little bit of arthritis underneath the kneecap.

## 2023-08-05 ENCOUNTER — Encounter: Payer: Self-pay | Admitting: Family Medicine

## 2023-09-09 ENCOUNTER — Encounter: Payer: Self-pay | Admitting: Neurology

## 2023-09-09 ENCOUNTER — Other Ambulatory Visit: Payer: Self-pay

## 2023-09-09 ENCOUNTER — Ambulatory Visit: Admitting: Family Medicine

## 2023-09-09 VITALS — BP 140/78 | HR 83 | Ht 66.5 in | Wt 191.0 lb

## 2023-09-09 DIAGNOSIS — R202 Paresthesia of skin: Secondary | ICD-10-CM

## 2023-09-09 DIAGNOSIS — G2581 Restless legs syndrome: Secondary | ICD-10-CM

## 2023-09-09 DIAGNOSIS — D509 Iron deficiency anemia, unspecified: Secondary | ICD-10-CM | POA: Diagnosis not present

## 2023-09-09 LAB — FERRITIN: Ferritin: 25.7 ng/mL (ref 10.0–291.0)

## 2023-09-09 MED ORDER — GABAPENTIN 300 MG PO CAPS
ORAL_CAPSULE | ORAL | 2 refills | Status: DC
Start: 2023-09-09 — End: 2024-02-10

## 2023-09-09 NOTE — Progress Notes (Signed)
   Joanna Muck, PhD, LAT, ATC acting as a scribe for [name], MD.  [name] is a 68 y.o. female who presents to [facility] at [facility] today for f/u R hip, R knee, and bilat foot pain. Pt was last seen by Dr. [name] on 08/03/23 and was given a R knee steroid injection and advised to use MT pads.  Today, pt reports hip and knee are feeling much better. She tried the pads on her feet, but didn't notice much difference. She describes a "burning" pain in her feet intermittently w/ paresthesia.  She notes this happens during the day and is very bothersome at night.  She also notes some restless leg syndrome type symptoms.  She takes gabapentin  300 mg twice daily which has been generally pretty helpful.  Dx imaging: 08/03/23 R knee & R hip XR  Pertinent review of systems: No fevers or chills  Relevant historical information: Pertinent history for iron  deficiency anemia on oral iron  currently.   Exam:  BP (!) 140/78   Pulse 83   Ht 5' 6.5" (1.689 m)   Wt 191 lb (86.6 kg)   SpO2 98%   BMI 30.37 kg/m  General: Well Developed, well nourished, and in no acute distress.   MSK: Feet bilaterally normal-appearing     Assessment and Plan: 68 y.o. female with paresthesia bilateral lower extremities.  Etiology is unclear.  Differential diagnosis includes peripheral neuropathy or Morton's neuroma or even lumbar radiculopathy.  Plan for nerve conduction study to further clarify source of paresthesia.   Additionally she has some restless leg syndrome type symptoms.  She does have a history of iron  deficiency anemia and is currently treated with oral iron .  Will check iron  stores as well. Recheck after nerve conduction study results.   PDMP not reviewed this encounter. Orders Placed This Encounter  Procedures   IBC panel    Standing Status:   Future    Expiration Date:   09/08/2024   Ferritin    Standing Status:   Future    Expiration Date:   09/08/2024   Ambulatory  referral to Neurology    Referral Priority:   Routine    Referral Type:   Consultation    Referral Reason:   Specialty Services Required    Requested Specialty:   Neurology    Number of Visits Requested:   1   NCV with EMG(electromyography)    Bilat LE    Standing Status:   Future    Expiration Date:   09/08/2024   Meds ordered this encounter  Medications   gabapentin  (NEURONTIN ) 300 MG capsule    Sig: 300mg  during the day, 600mg  at bedtime    Dispense:  90 capsule    Refill:  2     Discussed warning signs or symptoms. Please see discharge instructions. Patient expresses understanding.   The above documentation has been reviewed and is accurate and complete [name], M.D.

## 2023-09-09 NOTE — Patient Instructions (Addendum)
 Thank you for coming in today.   I've referred you to Neurology for a Nerve Conduction Study.  Let us  know if you don't hear from them in one week.   Let's change the dosage of you Gabapentin : 300mg  during the day, 600mg  at bedtime  Please get labs today before you leave   Check back after we get the test results back

## 2023-09-10 LAB — IBC PANEL
Iron: 62 ug/dL (ref 42–145)
Saturation Ratios: 16.6 % — ABNORMAL LOW (ref 20.0–50.0)
TIBC: 373.8 ug/dL (ref 250.0–450.0)
Transferrin: 267 mg/dL (ref 212.0–360.0)

## 2023-09-17 ENCOUNTER — Ambulatory Visit: Payer: Self-pay | Admitting: Family Medicine

## 2023-09-17 NOTE — Telephone Encounter (Signed)
 Called pt and advised of results per Dr. [name]. Pt verbalized understanding. She states that she thought there was iron  in her current supplement but it turned out to be Vitamin D . She is not currently on iron  supplementation.   Forwarding to Dr. [name].

## 2023-09-17 NOTE — Progress Notes (Signed)
 Iron  studies show low overall total iron .  Is not as low as it has been in the past but it is pretty low.  How much iron  are you taking by mouth?

## 2023-09-17 NOTE — Telephone Encounter (Signed)
 Savannah Even, MD to [facility] Clinical  (Selected Message)    09/17/23  6:52 AM Note     Iron  studies show low overall total iron .  Is not as low as it has been in the past but it is pretty low.  How much iron  are you taking by mouth?

## 2023-09-23 ENCOUNTER — Encounter: Payer: Self-pay | Admitting: Family Medicine

## 2023-09-23 NOTE — Telephone Encounter (Signed)
 Forwarding to Dr. Denyse Amass to review and advise.

## 2023-10-04 ENCOUNTER — Ambulatory Visit: Payer: Medicare HMO

## 2023-10-05 ENCOUNTER — Ambulatory Visit (INDEPENDENT_AMBULATORY_CARE_PROVIDER_SITE_OTHER)

## 2023-10-05 VITALS — Ht 66.5 in | Wt 191.0 lb

## 2023-10-05 DIAGNOSIS — M2550 Pain in unspecified joint: Secondary | ICD-10-CM | POA: Insufficient documentation

## 2023-10-05 DIAGNOSIS — M1991 Primary osteoarthritis, unspecified site: Secondary | ICD-10-CM | POA: Insufficient documentation

## 2023-10-05 DIAGNOSIS — Z0000 Encounter for general adult medical examination without abnormal findings: Secondary | ICD-10-CM

## 2023-10-05 NOTE — Patient Instructions (Signed)
 [name] , Thank you for taking time out of your busy schedule to complete your Annual Wellness Visit with me. I enjoyed our conversation and look forward to speaking with you again next year. I, as well as your care team,  appreciate your ongoing commitment to your health goals. Please review the following plan we discussed and let me know if I can assist you in the future. Your Game plan/ To Do List   Follow up Visits: Next Medicare AWV with our clinical staff: 10/05/2024.   Have you seen your provider in the last 6 months (3 months if uncontrolled diabetes)? Yes Next Office Visit with your provider: 02/03/2024.  Clinician Recommendations:  Aim for 30 minutes of exercise or brisk walking, 6-8 glasses of water, and 5 servings of fruits and vegetables each day. You are due for a Shingles vaccine and can get that done at your local pharmacy.        This is a list of the screening recommended for you and due dates:  Health Maintenance  Topic Date Due   Zoster (Shingles) Vaccine (1 of 2) Never done   DEXA scan (bone density measurement)  Never done   Flu Shot  11/05/2023   Medicare Annual Wellness Visit  10/04/2024   Mammogram  10/18/2024   DTaP/Tdap/Td vaccine (3 - Td or Tdap) 07/12/2030   Colon Cancer Screening  10/12/2030   Pneumococcal Vaccine for age over 95  Completed   Hepatitis C Screening  Completed   Hepatitis B Vaccine  Aged Out   HPV Vaccine  Aged Out   Meningitis B Vaccine  Aged Out   COVID-19 Vaccine  Discontinued    Advanced directives: (Declined) Advance directive discussed with you today. Even though you declined this today, please call our office should you change your mind, and we can give you the proper paperwork for you to fill out. Advance Care Planning is important because it:  [x]  Makes sure you receive the medical care that is consistent with your values, goals, and preferences  [x]  It provides guidance to your family and loved ones and reduces their decisional  burden about whether or not they are making the right decisions based on your wishes.  Follow the link provided in your after visit summary or read over the paperwork we have mailed to you to help you started getting your Advance Directives in place. If you need assistance in completing these, please reach out to us  so that we can help you!  See attachments for Preventive Care and Fall Prevention Tips.

## 2023-10-05 NOTE — Progress Notes (Signed)
 Subjective:   [name] is a 68 y.o. who presents for a Medicare Wellness preventive visit.  As a reminder, Annual Wellness Visits don't include a physical exam, and some assessments may be limited, especially if this visit is performed virtually. We may recommend an in-person follow-up visit with your provider if needed.  Visit Complete: Virtual I connected with  [name] on 10/05/23 by a audio enabled telemedicine application and verified that I am speaking with the correct person using two identifiers.  Patient Location: Home  Provider Location: Home Office  I discussed the limitations of evaluation and management by telemedicine. The patient expressed understanding and agreed to proceed.  Vital Signs: Because this visit was a virtual/telehealth visit, some criteria may be missing or patient reported. Any vitals not documented were not able to be obtained and vitals that have been documented are patient reported.  VideoDeclined- This patient declined Librarian, academic. Therefore the visit was completed with audio only.  Persons Participating in Visit: Patient.  AWV Questionnaire: No: Patient Medicare AWV questionnaire was not completed prior to this visit.  Cardiac Risk Factors include: advanced age (>69men, >47 women);hypertension;Other (see comment);dyslipidemia;obesity (BMI >30kg/m2), Risk factor comments: PVD     Objective:    Today's Vitals   10/05/23 1441  Weight: 191 lb (86.6 kg)  Height: 5' 6.5 (1.689 m)   Body mass index is 30.37 kg/m.     10/05/2023    3:04 PM 09/24/2022    3:40 PM 10/21/2021    3:51 PM 11/10/2018    2:21 PM  Advanced Directives  Does Patient Have a Medical Advance Directive? No No No No  Would patient like information on creating a medical advance directive?  No - Patient declined Yes (MAU/Ambulatory/Procedural Areas - Information given)     Current Medications (verified) Outpatient Encounter Medications  as of 10/05/2023  Medication Sig   ALPRAZolam (XANAX) 0.5 MG tablet    amLODipine  (NORVASC ) 10 MG tablet TAKE 1 TABLET(10 MG) BY MOUTH DAILY.   aspirin  EC 81 MG tablet Take 1 tablet (81 mg total) by mouth daily.   citalopram  (CELEXA ) 10 MG tablet TAKE 1 TABLET(10 MG) BY MOUTH DAILY   gabapentin  (NEURONTIN ) 300 MG capsule 300mg  during the day, 600mg  at bedtime   levothyroxine  (SYNTHROID ) 112 MCG tablet Take 1 tablet (112 mcg total) by mouth daily.   lisinopril  (ZESTRIL ) 40 MG tablet Take 1 tablet (40 mg total) by mouth daily.   meloxicam  (MOBIC ) 15 MG tablet Take 1 tablet (15 mg total) by mouth daily as needed.   zolpidem  (AMBIEN ) 10 MG tablet Take 1 tablet (10 mg total) by mouth at bedtime as needed.   [DISCONTINUED] gabapentin  (NEURONTIN ) 300 MG capsule TAKE 1 CAPSULE BY MOUTH THREE TIMES DAILY AS NEEDED FOR NERVE PAIN   No facility-administered encounter medications on file as of 10/05/2023.    Allergies (verified) Patient has no known allergies.   History: Past Medical History:  Diagnosis Date   ALLERGIC RHINITIS 11/02/2006   Allergy    Anemia 2022   been taking Iron    Arthritis    Cervical disc disease 04/15/2011   Chronic maxillary sinusitis 10/05/2008   Chronic neck pain 04/15/2011   GERD 11/02/2006   HYPERLIPIDEMIA 01/10/2007   Hyperlipidemia 01/10/2007   Qualifier: Diagnosis of  By: Norleen MD, Lynwood ORN    HYPERTENSION 01/10/2007   HYPOTHYROIDISM 11/02/2006   NEPHROLITHIASIS, HX OF 01/10/2007   PMR (polymyalgia rheumatica) (HCC) 11/10/2018   SINUSITIS-  ACUTE-NOS 04/03/2010   Past Surgical History:  Procedure Laterality Date   ABDOMINAL HYSTERECTOMY  04/06/1994   COLONOSCOPY     PARTIAL HYSTERECTOMY     has ovaries   s/p selective nerve root block 2002     per Dr. Lucilla to right neck   TONSILLECTOMY  04/07/1959   TONSILLECTOMY     age 72   TUBAL LIGATION     Family History  Problem Relation Age of Onset   Hypothyroidism Mother    Colon polyps Father     Hypertension Other    Diabetes Other    Colon cancer Neg Hx    Esophageal cancer Neg Hx    Rectal cancer Neg Hx    Stomach cancer Neg Hx    Social History   Socioeconomic History   Marital status: Married    Spouse name: Marinell   Number of children: 2   Years of education: Not on file   Highest education level: 12th grade  Occupational History   Occupation: RETIRED/ELIGIBILITY/    Employer: Advertising copywriter   Occupation: Part time Actuary: [employer]  Tobacco Use   Smoking status: Never   Smokeless tobacco: Never  Vaping Use   Vaping status: Never Used  Substance and Sexual Activity   Alcohol use: No   Drug use: No   Sexual activity: Yes    Partners: Male  Other Topics Concern   Not on file  Social History Narrative   Lives with husband and 1 daughter and 5 dogs/2025   Social Drivers of Health   Financial Resource Strain: Low Risk  (07/05/2023)   Overall Financial Resource Strain (CARDIA)    Difficulty of Paying Living Expenses: Not very hard  Food Insecurity: No Food Insecurity (07/05/2023)   Hunger Vital Sign    Worried About Running Out of Food in the Last Year: Never true    Ran Out of Food in the Last Year: Never true  Transportation Needs: No Transportation Needs (10/05/2023)   PRAPARE - Administrator, Civil Service (Medical): No    Lack of Transportation (Non-Medical): No  Physical Activity: Inactive (10/05/2023)   Exercise Vital Sign    Days of Exercise per Week: 0 days    Minutes of Exercise per Session: 0 min  Stress: No Stress Concern Present (10/05/2023)   [employer] of Occupational Health - Occupational Stress Questionnaire    Feeling of Stress: Only a little  Social Connections: Socially Integrated (10/05/2023)   Social Connection and Isolation Panel    Frequency of Communication with Friends and Family: Three times a week    Frequency of Social Gatherings with Friends and Family: Once a week    Attends Religious Services:  More than 4 times per year    Active Member of [employer] or Organizations: Yes    Attends Banker Meetings: Never    Marital Status: Married    Tobacco Counseling Counseling given: Not Answered    Clinical Intake:  Pre-visit preparation completed: Yes  Pain : No/denies pain     BMI - recorded: 30.37 Nutritional Status: BMI > 30  Obese Nutritional Risks: None Diabetes: No  Lab Results  Component Value Date   HGBA1C 5.3 02/01/2023   HGBA1C 5.3 07/23/2022   HGBA1C 5.2 07/17/2021     How often do you need to have someone help you when you read instructions, pamphlets, or other written materials from your doctor or pharmacy?: 1 - Never  Interpreter Needed?: No  Information entered by :: Stephana Morell, RMA   Activities of Daily Living     10/05/2023    2:56 PM  In your present state of health, do you have any difficulty performing the following activities:  Hearing? 0  Vision? 0  Difficulty concentrating or making decisions? 0  Walking or climbing stairs? 0  Dressing or bathing? 0  Doing errands, shopping? 0  Preparing Food and eating ? N  Using the Toilet? N  In the past six months, have you accidently leaked urine? Y  Do you have problems with loss of bowel control? N  Managing your Medications? N  Managing your Finances? N  Housekeeping or managing your Housekeeping? N    Patient Care Team: Norleen Lynwood ORN, MD as PCP - General Dunn, Maude POUR as Consulting Physician (Optometry)  I have updated your Care Teams any recent Medical Services you may have received from other providers in the past year.     Assessment:   This is a routine wellness examination for [place].  Hearing/Vision screen Hearing Screening - Comments:: Denies hearing difficulties   Vision Screening - Comments:: Wears eyeglasses/Dr Dunn    Goals Addressed               This Visit's Progress     My goal is to increase my walking and improve my eating habits. (pt-stated)    Not on track     Still working on it/2025       Depression Screen     10/05/2023    3:08 PM 07/29/2023    9:03 AM 07/08/2023    8:39 AM 02/01/2023    9:40 AM 09/24/2022    3:37 PM 07/23/2022    8:12 AM 05/26/2022    3:11 PM  PHQ 2/9 Scores  PHQ - 2 Score 0 0 0 0 0 0 0  PHQ- 9 Score 0     0 0    Fall Risk     10/05/2023    3:04 PM 07/29/2023    9:06 AM 07/08/2023    8:48 AM 02/01/2023    9:40 AM 09/24/2022    3:39 PM  Fall Risk   Falls in the past year? 0 0 0 0 0  Number falls in past yr: 0 0 0 0 0  Injury with Fall? 0 0 0 0 0  Risk for fall due to :  No Fall Risks No Fall Risks No Fall Risks No Fall Risks  Follow up Falls evaluation completed;Falls prevention discussed Falls evaluation completed Falls evaluation completed Falls evaluation completed Falls prevention discussed    MEDICARE RISK AT HOME:  Medicare Risk at Home Any stairs in or around the home?: Yes If so, are there any without handrails?: No Home free of loose throw rugs in walkways, pet beds, electrical cords, etc?: Yes Adequate lighting in your home to reduce risk of falls?: Yes Life alert?: No Use of a cane, walker or w/c?: No Grab bars in the bathroom?: Yes Shower chair or bench in shower?: No Elevated toilet seat or a handicapped toilet?: Yes  TIMED UP AND GO:  Was the test performed?  No  Cognitive Function: Declined/Normal: No cognitive concerns noted by patient or family. Patient alert, oriented, able to answer questions appropriately and recall recent events. No signs of memory loss or confusion.        09/24/2022    3:40 PM 10/21/2021    4:11 PM  6CIT Screen  What Year? 0 points 0 points  What month? 0 points 0 points  What time? 0 points 0 points  Count back from 20 0 points 0 points  Months in reverse 0 points 0 points  Repeat phrase 0 points 0 points  Total Score 0 points 0 points    Immunizations Immunization History  Administered Date(s) Administered   Influenza Inj Mdck Quad Pf  01/23/2018   Influenza Split 03/13/2022   Influenza Whole 01/23/2008, 01/21/2009   Influenza,inj,Quad PF,6+ Mos 02/01/2017, 01/16/2018   Influenza-Unspecified 02/05/2015, 01/16/2018, 02/05/2019, 01/23/2021, 02/04/2021   PFIZER(Purple Top)SARS-COV-2 Vaccination 06/28/2019, 07/19/2019   PNEUMOCOCCAL CONJUGATE-20 07/17/2021   Td 02/06/2009   Tdap 07/11/2020   Unspecified SARS-COV-2 Vaccination 06/16/2019, 06/30/2019    Screening Tests Health Maintenance  Topic Date Due   Zoster Vaccines- Shingrix (1 of 2) Never done   DEXA SCAN  Never done   INFLUENZA VACCINE  11/05/2023   Medicare Annual Wellness (AWV)  10/04/2024   MAMMOGRAM  10/18/2024   DTaP/Tdap/Td (3 - Td or Tdap) 07/12/2030   Colonoscopy  10/12/2030   Pneumococcal Vaccine: 50+ Years  Completed   Hepatitis C Screening  Completed   Hepatitis B Vaccines  Aged Out   HPV VACCINES  Aged Out   Meningococcal B Vaccine  Aged Out   COVID-19 Vaccine  Discontinued    Health Maintenance  Health Maintenance Due  Topic Date Due   Zoster Vaccines- Shingrix (1 of 2) Never done   DEXA SCAN  Never done   Health Maintenance Items Addressed: See Nurse Notes at the end of this note  Additional Screening:  Vision Screening: Recommended annual ophthalmology exams for early detection of glaucoma and other disorders of the eye. Would you like a referral to an eye doctor? No    Dental Screening: Recommended annual dental exams for proper oral hygiene  Community Resource Referral / Chronic Care Management: CRR required this visit?  No   CCM required this visit?  No   Plan:    I have personally reviewed and noted the following in the patient's chart:   Medical and social history Use of alcohol, tobacco or illicit drugs  Current medications and supplements including opioid prescriptions. Patient is not currently taking opioid prescriptions. Functional ability and status Nutritional status Physical activity Advanced  directives List of other physicians Hospitalizations, surgeries, and ER visits in previous 12 months Vitals Screenings to include cognitive, depression, and falls Referrals and appointments  In addition, I have reviewed and discussed with patient certain preventive protocols, quality metrics, and best practice recommendations. A written personalized care plan for preventive services as well as general preventive health recommendations were provided to patient.   Avary Eichenberger L Chianti Goh, CMA   10/05/2023   After Visit Summary: (MyChart) Due to this being a telephonic visit, the after visit summary with patients personalized plan was offered to patient via MyChart   Notes: Patient is due for a Shingrix vaccine.  She is also due for a DEXA, however, patient stated that she has an appointment for a mammogram scheduled in August and she will ask to get the DEXA on the same day as mammogram appointment.  Patient had no other concerns to address today.

## 2023-10-19 ENCOUNTER — Encounter: Admitting: Neurology

## 2023-11-09 ENCOUNTER — Ambulatory Visit: Admitting: Neurology

## 2023-11-09 DIAGNOSIS — R202 Paresthesia of skin: Secondary | ICD-10-CM

## 2023-11-09 NOTE — Procedures (Signed)
 [facility]  [address] [place], [address]  [place], [place] [address] Tel: [phone] Fax: [phone] Test Date:  11/09/2023  Patient: [name] DOB: 25-Nov-1955 Physician: Venetia Potters, MD  Sex: Female Height: 5' 6.5 Ref Phys: Artist Lloyd, MD  ID#: [id number]   Technician:    History: This is a 68 year old female with burning pain in her feet.  NCV & EMG Findings: Extensive electrodiagnostic evaluation of bilateral lower limbs shows: Bilateral sural and superficial peroneal/fibular sensory responses are within normal limits. Bilateral peroneal/fibular (EDB) and tibial (AH) motor responses are within normal limits. Bilateral H reflex latencies are absent. There is no evidence of active or chronic motor axon loss changes affecting any of the tested muscles. Motor unit configuration and recruitment pattern is within normal limits.  Impression: This study shows no significant abnormalities. Specifically: No electrodiagnostic evidence of a right or left lumbosacral (L3-S1) radiculopathy. No electrodiagnostic evidence of a large fiber sensorimotor neuropathy. Absent bilateral H reflexes are of unclear clinical significance. Given the otherwise normal study, these findings are likely technical in nature.     ___________________________ Venetia Potters, MD    Nerve Conduction Studies Motor Nerve Results    Latency Amplitude F-Lat Segment Distance CV Comment  Site (ms) Norm (mV) Norm (ms)  (cm) (m/s) Norm   Left Fibular (EDB) Motor  Ankle 3.3  < 6.0 4.4  > 2.5        Bel fib head 10.2 - 3.6 -  Bel fib head-Ankle 31.5 46  > 40   Pop fossa 11.8 - 3.5 -  Pop fossa-Bel fib head 8 50 -   Right Fibular (EDB) Motor  Ankle 3.2  < 6.0 4.9  > 2.5        Bel fib head 10.9 - 4.3 -  Bel fib head-Ankle 33 43  > 40   Pop fossa 12.5 - 4.2 -  Pop fossa-Bel fib head 8 50 -   Left Tibial (AH) Motor  Ankle 3.9  < 6.0 10.3  > 4.0        Knee 12.6 - 7.5 -  Knee-Ankle 39 45  > 40   Right  Tibial (AH) Motor  Ankle 4.4  < 6.0 11.2  > 4.0        Knee 12.7 - 7.0 -  Knee-Ankle 43 52  > 40    Sensory Sites    Neg Peak Lat Amplitude (O-P) Segment Distance Velocity Comment  Site (ms) Norm (V) Norm  (cm) (ms)   Left Superficial Fibular Sensory  14 cm-Ankle 2.3  < 4.6 6  > 3 14 cm-Ankle 14    Right Superficial Fibular Sensory  14 cm-Ankle 2.2  < 4.6 4  > 3 14 cm-Ankle 14    Left Sural Sensory  Calf-Lat mall 2.9  < 4.6 4  > 3 Calf-Lat mall 14    Right Sural Sensory  Calf-Lat mall 3.3  < 4.6 4  > 3 Calf-Lat mall 12     H-Reflex Results    M-Lat H Lat H Neg Amp H-M Lat  Site (ms) (ms) Norm (mV) (ms)  Left Tibial H-Reflex  Pop fossa 5.7 -  < 35.0 - -  Right Tibial H-Reflex  Pop fossa 6.5 -  < 35.0 - -   Electromyography   Side Muscle Ins.Act Fibs Fasc Recrt Amp Dur Poly Activation Comment  Left Tib ant Nml Nml Nml Nml Nml Nml Nml Nml N/A  Left Gastroc MH Nml Nml  Nml Nml Nml Nml Nml Nml N/A  Left Vastus lat Nml Nml Nml Nml Nml Nml Nml Nml N/A  Left Biceps fem SH Nml Nml Nml Nml Nml Nml Nml Nml N/A  Left Gluteus med Nml Nml Nml Nml Nml Nml Nml Nml N/A  Right Tib ant Nml Nml Nml Nml Nml Nml Nml Nml N/A  Right Gastroc MH Nml Nml Nml Nml Nml Nml Nml Nml N/A  Right Vastus lat Nml Nml Nml Nml Nml Nml Nml Nml N/A  Right Biceps fem SH Nml Nml Nml Nml Nml Nml Nml Nml N/A  Right Gluteus med Nml Nml Nml Nml Nml Nml Nml Nml N/A      Waveforms:  Motor           Sensory           H-Reflex

## 2023-11-11 ENCOUNTER — Ambulatory Visit: Payer: Self-pay | Admitting: Sports Medicine

## 2023-11-19 DIAGNOSIS — H5213 Myopia, bilateral: Secondary | ICD-10-CM | POA: Diagnosis not present

## 2023-11-19 DIAGNOSIS — Z0100 Encounter for examination of eyes and vision without abnormal findings: Secondary | ICD-10-CM | POA: Diagnosis not present

## 2023-11-23 DIAGNOSIS — Z6831 Body mass index (BMI) 31.0-31.9, adult: Secondary | ICD-10-CM | POA: Diagnosis not present

## 2023-11-23 DIAGNOSIS — R87610 Atypical squamous cells of undetermined significance on cytologic smear of cervix (ASC-US): Secondary | ICD-10-CM | POA: Diagnosis not present

## 2023-11-23 DIAGNOSIS — Z1231 Encounter for screening mammogram for malignant neoplasm of breast: Secondary | ICD-10-CM | POA: Diagnosis not present

## 2023-11-23 DIAGNOSIS — Z124 Encounter for screening for malignant neoplasm of cervix: Secondary | ICD-10-CM | POA: Diagnosis not present

## 2023-11-23 DIAGNOSIS — Z1272 Encounter for screening for malignant neoplasm of vagina: Secondary | ICD-10-CM | POA: Diagnosis not present

## 2024-02-03 ENCOUNTER — Ambulatory Visit: Admitting: Internal Medicine

## 2024-02-03 ENCOUNTER — Other Ambulatory Visit: Payer: Self-pay | Admitting: Internal Medicine

## 2024-02-10 ENCOUNTER — Ambulatory Visit: Payer: Self-pay | Admitting: Internal Medicine

## 2024-02-10 ENCOUNTER — Ambulatory Visit: Admitting: Internal Medicine

## 2024-02-10 ENCOUNTER — Encounter: Payer: Self-pay | Admitting: Internal Medicine

## 2024-02-10 VITALS — BP 124/70 | HR 70 | Temp 98.8°F | Ht 66.5 in | Wt 189.0 lb

## 2024-02-10 DIAGNOSIS — E7800 Pure hypercholesterolemia, unspecified: Secondary | ICD-10-CM | POA: Diagnosis not present

## 2024-02-10 DIAGNOSIS — E538 Deficiency of other specified B group vitamins: Secondary | ICD-10-CM

## 2024-02-10 DIAGNOSIS — G2581 Restless legs syndrome: Secondary | ICD-10-CM | POA: Diagnosis not present

## 2024-02-10 DIAGNOSIS — I10 Essential (primary) hypertension: Secondary | ICD-10-CM

## 2024-02-10 DIAGNOSIS — R739 Hyperglycemia, unspecified: Secondary | ICD-10-CM | POA: Diagnosis not present

## 2024-02-10 DIAGNOSIS — E039 Hypothyroidism, unspecified: Secondary | ICD-10-CM

## 2024-02-10 DIAGNOSIS — E559 Vitamin D deficiency, unspecified: Secondary | ICD-10-CM

## 2024-02-10 DIAGNOSIS — Z23 Encounter for immunization: Secondary | ICD-10-CM | POA: Diagnosis not present

## 2024-02-10 DIAGNOSIS — M150 Primary generalized (osteo)arthritis: Secondary | ICD-10-CM | POA: Insufficient documentation

## 2024-02-10 DIAGNOSIS — R202 Paresthesia of skin: Secondary | ICD-10-CM

## 2024-02-10 DIAGNOSIS — D509 Iron deficiency anemia, unspecified: Secondary | ICD-10-CM

## 2024-02-10 LAB — URINALYSIS, ROUTINE W REFLEX MICROSCOPIC
Bilirubin Urine: NEGATIVE
Hgb urine dipstick: NEGATIVE
Ketones, ur: NEGATIVE
Leukocytes,Ua: NEGATIVE
Nitrite: NEGATIVE
RBC / HPF: NONE SEEN (ref 0–?)
Specific Gravity, Urine: 1.01 (ref 1.000–1.030)
Total Protein, Urine: NEGATIVE
Urine Glucose: NEGATIVE
Urobilinogen, UA: 0.2 (ref 0.0–1.0)
WBC, UA: NONE SEEN (ref 0–?)
pH: 6.5 (ref 5.0–8.0)

## 2024-02-10 LAB — BASIC METABOLIC PANEL WITH GFR
BUN: 16 mg/dL (ref 6–23)
CO2: 29 meq/L (ref 19–32)
Calcium: 9.7 mg/dL (ref 8.4–10.5)
Chloride: 101 meq/L (ref 96–112)
Creatinine, Ser: 0.69 mg/dL (ref 0.40–1.20)
GFR: 89.1 mL/min (ref >60.00)
Glucose, Bld: 83 mg/dL (ref 70–99)
Potassium: 4 meq/L (ref 3.5–5.1)
Sodium: 138 meq/L (ref 135–145)

## 2024-02-10 LAB — CBC WITH DIFFERENTIAL/PLATELET
Basophils Absolute: 0.1 K/uL (ref 0.0–0.1)
Basophils Relative: 0.9 % (ref 0.0–3.0)
Eosinophils Absolute: 0.3 K/uL (ref 0.0–0.7)
Eosinophils Relative: 4.1 % (ref 0.0–5.0)
HCT: 39.1 % (ref 36.0–46.0)
Hemoglobin: 12.9 g/dL (ref 12.0–15.0)
Lymphocytes Relative: 35.7 % (ref 12.0–46.0)
Lymphs Abs: 2.4 K/uL (ref 0.7–4.0)
MCHC: 33 g/dL (ref 30.0–36.0)
MCV: 88.5 fl (ref 78.0–100.0)
Monocytes Absolute: 0.5 K/uL (ref 0.1–1.0)
Monocytes Relative: 7 % (ref 3.0–12.0)
Neutro Abs: 3.5 K/uL (ref 1.4–7.7)
Neutrophils Relative %: 52.3 % (ref 43.0–77.0)
Platelets: 228 K/uL (ref 150.0–400.0)
RBC: 4.42 Mil/uL (ref 3.87–5.11)
RDW: 13.8 % (ref 11.5–15.5)
WBC: 6.7 K/uL (ref 4.0–10.5)

## 2024-02-10 LAB — HEPATIC FUNCTION PANEL
ALT: 15 U/L (ref 0–35)
AST: 16 U/L (ref 0–37)
Albumin: 4.5 g/dL (ref 3.5–5.2)
Alkaline Phosphatase: 94 U/L (ref 39–117)
Bilirubin, Direct: 0 mg/dL (ref 0.0–0.3)
Total Bilirubin: 0.4 mg/dL (ref 0.2–1.2)
Total Protein: 7 g/dL (ref 6.0–8.3)

## 2024-02-10 LAB — LIPID PANEL
Cholesterol: 185 mg/dL (ref 0–200)
HDL: 54.2 mg/dL (ref >39.00)
LDL Cholesterol: 91 mg/dL (ref 0–99)
NonHDL: 130.68
Total CHOL/HDL Ratio: 3
Triglycerides: 197 mg/dL — ABNORMAL HIGH (ref 0.0–149.0)
VLDL: 39.4 mg/dL (ref 0.0–40.0)

## 2024-02-10 LAB — HEMOGLOBIN A1C: Hgb A1c MFr Bld: 5.2 % (ref 4.6–6.5)

## 2024-02-10 LAB — VITAMIN B12: Vitamin B-12: 960 pg/mL — ABNORMAL HIGH (ref 211–911)

## 2024-02-10 LAB — VITAMIN D 25 HYDROXY (VIT D DEFICIENCY, FRACTURES): VITD: 72.04 ng/mL (ref 30.00–100.00)

## 2024-02-10 LAB — TSH: TSH: 0.29 u[IU]/mL — ABNORMAL LOW (ref 0.35–5.50)

## 2024-02-10 MED ORDER — GABAPENTIN 300 MG PO CAPS
ORAL_CAPSULE | ORAL | 5 refills | Status: AC
Start: 2024-02-10 — End: ?

## 2024-02-10 MED ORDER — ZOLPIDEM TARTRATE 10 MG PO TABS: 10.0000 mg | ORAL_TABLET | Freq: Every evening | ORAL | 1 refills | Status: AC | PRN

## 2024-02-10 NOTE — Patient Instructions (Signed)
 You had the flu shot today  Please have your Shingrix (shingles) shots done at your local pharmacy.  Please continue all other medications as before, and refills have been done if requested.  Please have the pharmacy call with any other refills you may need.  Please continue your efforts at being more active, low cholesterol diet, and weight control.  Please keep your appointments with your specialists as you may have planned  Please go to the LAB at the blood drawing area for the tests to be done  You will be contacted by phone if any changes need to be made immediately.  Otherwise, you will receive a letter about your results with an explanation, but please check with MyChart first.  Please make an Appointment to return in 6 months, or sooner if needed

## 2024-02-10 NOTE — Assessment & Plan Note (Signed)
 Last vitamin D Lab Results  Component Value Date   VD25OH 44.17 02/01/2023   Stable, cont oral replacement

## 2024-02-10 NOTE — Assessment & Plan Note (Addendum)
 Lab Results  Component Value Date   VITAMINB12 1,345 (H) 02/01/2023   Stable, cont oral replacement - b12 1000 mcg every day, f/u lab

## 2024-02-10 NOTE — Assessment & Plan Note (Signed)
 Lab Results  Component Value Date   LDLCALC 92 02/01/2023   Stable, pt to continue diet, wt control, for fu lab

## 2024-02-10 NOTE — Assessment & Plan Note (Signed)
 BP Readings from Last 3 Encounters:  02/10/24 124/70  09/09/23 (!) 140/78  08/03/23 112/70   Stable, pt to continue medical treatment norvasc  10 every day, lisinopril  40 qd

## 2024-02-10 NOTE — Assessment & Plan Note (Signed)
 Lab Results  Component Value Date   HGBA1C 5.3 02/01/2023   Stable, pt to continue current medical treatment  - diet, wt control

## 2024-02-10 NOTE — Progress Notes (Signed)
 Patient ID: [name], female   DOB: 11/09/1955, 68 y.o.   MRN: [id number]        Chief Complaint: follow up low vit d, low b12, low thyroid , hld, hyperglycemia, htn       HPI:  [name] is a 68 y.o. female here overall doing ok.  Pt denies chest pain, increased sob or doe, wheezing, orthopnea, PND, increased LE swelling, palpitations, dizziness or syncope.   Pt denies polydipsia, polyuria, or new focal neuro s/s.    Pt denies fever, wt loss, night sweats, loss of appetite, or other constitutional symptoms    Due for flu shot today, for shingrx at pharmacy.   Has mild recurring right cervical radiculitis but no worse.  Did have cortisone to right knee per Sports medicine, but considering f/u there as she now seems to have some instability of the left knee at times, but no giveaways or falls.  For flu shot today Wt Readings from Last 3 Encounters:  02/10/24 189 lb (85.7 kg)  10/05/23 191 lb (86.6 kg)  09/09/23 191 lb (86.6 kg)   BP Readings from Last 3 Encounters:  02/10/24 124/70  09/09/23 (!) 140/78  08/03/23 112/70         Past Medical History:  Diagnosis Date   ALLERGIC RHINITIS 11/02/2006   Allergy    Anemia 2022   been taking Iron    Arthritis    Cervical disc disease 04/15/2011   Chronic maxillary sinusitis 10/05/2008   Chronic neck pain 04/15/2011   GERD 11/02/2006   HYPERLIPIDEMIA 01/10/2007   Hyperlipidemia 01/10/2007   Qualifier: Diagnosis of  By: Norleen MD, Lynwood ORN    HYPERTENSION 01/10/2007   HYPOTHYROIDISM 11/02/2006   NEPHROLITHIASIS, HX OF 01/10/2007   PMR (polymyalgia rheumatica) 11/10/2018   SINUSITIS- ACUTE-NOS 04/03/2010   Past Surgical History:  Procedure Laterality Date   ABDOMINAL HYSTERECTOMY  04/06/1994   COLONOSCOPY     PARTIAL HYSTERECTOMY     has ovaries   s/p selective nerve root block 2002     per Dr. Lucilla to right neck   TONSILLECTOMY  04/07/1959   TONSILLECTOMY     age 66   TUBAL LIGATION      reports that she has never smoked. She  has never used smokeless tobacco. She reports that she does not drink alcohol and does not use drugs. family history includes Colon polyps in her father; Diabetes in an other family member; Hypertension in an other family member; Hypothyroidism in her mother. No Known Allergies Current Outpatient Medications on File Prior to Visit  Medication Sig Dispense Refill   ALPRAZolam (XANAX) 0.5 MG tablet   2   amLODipine  (NORVASC ) 10 MG tablet TAKE 1 TABLET(10 MG) BY MOUTH DAILY. 90 tablet 3   aspirin  EC 81 MG tablet Take 1 tablet (81 mg total) by mouth daily. 90 tablet 11   citalopram  (CELEXA ) 10 MG tablet TAKE 1 TABLET(10 MG) BY MOUTH DAILY 90 tablet 3   levothyroxine  (SYNTHROID ) 112 MCG tablet Take 1 tablet (112 mcg total) by mouth daily. 90 tablet 3   lisinopril  (ZESTRIL ) 40 MG tablet Take 1 tablet (40 mg total) by mouth daily. 90 tablet 3   meloxicam  (MOBIC ) 15 MG tablet Take 1 tablet (15 mg total) by mouth daily as needed. 90 tablet 1   No current facility-administered medications on file prior to visit.        ROS:  All others reviewed and negative.  Objective  PE:  BP 124/70 (BP Location: Right Arm, Patient Position: Sitting, Cuff Size: Normal)   Pulse 70   Temp 98.8 F (37.1 C) (Oral)   Ht 5' 6.5 (1.689 m)   Wt 189 lb (85.7 kg)   SpO2 98%   BMI 30.05 kg/m                 Constitutional: Pt appears in NAD               HENT: Head: NCAT.                Right Ear: External ear normal.                 Left Ear: External ear normal.                Eyes: . Pupils are equal, round, and reactive to light. Conjunctivae and EOM are normal               Nose: without d/c or deformity               Neck: Neck supple. Gross normal ROM               Cardiovascular: Normal rate and regular rhythm.                 Pulmonary/Chest: Effort normal and breath sounds without rales or wheezing.                Abd:  Soft, NT, ND, + BS, no organomegaly               Neurological: Pt is alert.  At baseline orientation, motor grossly intact               Skin: Skin is warm. No rashes, no other new lesions, LE edema - none               Psychiatric: Pt behavior is normal without agitation   Micro: none  Cardiac tracings I have personally interpreted today:  none  Pertinent Radiological findings (summarize): none   Lab Results  Component Value Date   WBC 6.6 07/23/2022   HGB 13.2 07/23/2022   HCT 39.5 07/23/2022   PLT 225.0 07/23/2022   GLUCOSE 96 02/01/2023   CHOL 179 02/01/2023   TRIG 157.0 (H) 02/01/2023   HDL 56.20 02/01/2023   LDLDIRECT 109.0 07/11/2020   LDLCALC 92 02/01/2023   ALT 16 02/01/2023   AST 18 02/01/2023   NA 139 02/01/2023   K 4.9 02/01/2023   CL 102 02/01/2023   CREATININE 0.75 02/01/2023   BUN 17 02/01/2023   CO2 30 02/01/2023   TSH 0.45 02/01/2023   HGBA1C 5.3 02/01/2023   Assessment/Plan:  [name] is a 68 y.o. White or Caucasian [1] female with  has a past medical history of ALLERGIC RHINITIS (11/02/2006), Allergy, Anemia (2022), Arthritis, Cervical disc disease (04/15/2011), Chronic maxillary sinusitis (10/05/2008), Chronic neck pain (04/15/2011), GERD (11/02/2006), HYPERLIPIDEMIA (01/10/2007), Hyperlipidemia (01/10/2007), HYPERTENSION (01/10/2007), HYPOTHYROIDISM (11/02/2006), NEPHROLITHIASIS, HX OF (01/10/2007), PMR (polymyalgia rheumatica) (11/10/2018), and SINUSITIS- ACUTE-NOS (04/03/2010).  Vitamin D  deficiency Last vitamin D  Lab Results  Component Value Date   VD25OH 44.17 02/01/2023   Stable, cont oral replacement   Vitamin B12 deficiency Lab Results  Component Value Date   VITAMINB12 1,345 (H) 02/01/2023   Stable, cont oral replacement - b12 1000 mcg every day, f/u lab  Hypothyroidism Lab Results  Component Value Date  TSH 0.45 02/01/2023   Stable, pt to continue levothyroxine  112 mcg per day, for f/u lab today   Hyperlipidemia Lab Results  Component Value Date   LDLCALC 92 02/01/2023   Stable, pt to  continue diet, wt control, for fu lab   Hyperglycemia Lab Results  Component Value Date   HGBA1C 5.3 02/01/2023   Stable, pt to continue current medical treatment  - diet, wt control   Essential hypertension BP Readings from Last 3 Encounters:  02/10/24 124/70  09/09/23 (!) 140/78  08/03/23 112/70   Stable, pt to continue medical treatment norvasc  10 every day, lisinopril  40 qd  Followup: Return in about 6 months (around 08/09/2024).  Lynwood Rush, MD 02/10/2024 1:00 PM [facility]

## 2024-02-10 NOTE — Assessment & Plan Note (Signed)
 Lab Results  Component Value Date   TSH 0.45 02/01/2023   Stable, pt to continue levothyroxine  112 mcg per day, for f/u lab today

## 2024-10-05 ENCOUNTER — Ambulatory Visit

## 2024-11-02 ENCOUNTER — Encounter: Admitting: Nurse Practitioner
